# Patient Record
Sex: Male | Born: 1940 | Race: White | Marital: Married | Smoking: Former smoker
Health system: Southern US, Community
[De-identification: ages and names within clinical notes are randomized; demographics above are authoritative.]

## PROBLEM LIST (undated history)

## (undated) DIAGNOSIS — I10 Essential (primary) hypertension: Secondary | ICD-10-CM

## (undated) DIAGNOSIS — C9210 Chronic myeloid leukemia, BCR/ABL-positive, not having achieved remission: Secondary | ICD-10-CM

## (undated) DIAGNOSIS — E870 Hyperosmolality and hypernatremia: Secondary | ICD-10-CM

## (undated) DIAGNOSIS — E611 Iron deficiency: Secondary | ICD-10-CM

## (undated) DIAGNOSIS — G459 Transient cerebral ischemic attack, unspecified: Secondary | ICD-10-CM

## (undated) HISTORY — DX: Chronic myeloid leukemia, BCR/ABL-positive, not having achieved remission: C92.10

## (undated) HISTORY — DX: Essential (primary) hypertension: I10

## (undated) HISTORY — PX: TOOTH EXTRACTION: SUR596

---

## 2019-04-14 ENCOUNTER — Other Ambulatory Visit: Payer: Self-pay

## 2019-04-15 ENCOUNTER — Other Ambulatory Visit: Payer: Self-pay

## 2019-04-15 ENCOUNTER — Inpatient Hospital Stay: Payer: Medicare Other

## 2019-04-15 ENCOUNTER — Inpatient Hospital Stay: Payer: Medicare Other | Attending: Internal Medicine | Admitting: Internal Medicine

## 2019-04-15 ENCOUNTER — Encounter (INDEPENDENT_AMBULATORY_CARE_PROVIDER_SITE_OTHER): Payer: Self-pay

## 2019-04-15 DIAGNOSIS — D649 Anemia, unspecified: Secondary | ICD-10-CM | POA: Diagnosis not present

## 2019-04-15 DIAGNOSIS — C9210 Chronic myeloid leukemia, BCR/ABL-positive, not having achieved remission: Secondary | ICD-10-CM | POA: Diagnosis present

## 2019-04-15 DIAGNOSIS — F0390 Unspecified dementia without behavioral disturbance: Secondary | ICD-10-CM | POA: Diagnosis not present

## 2019-04-15 DIAGNOSIS — N189 Chronic kidney disease, unspecified: Secondary | ICD-10-CM | POA: Diagnosis not present

## 2019-04-15 LAB — CBC WITH DIFFERENTIAL/PLATELET
Abs Immature Granulocytes: 0.03 10*3/uL (ref 0.00–0.07)
Basophils Absolute: 0.1 10*3/uL (ref 0.0–0.1)
Basophils Relative: 1 %
Eosinophils Absolute: 0.2 10*3/uL (ref 0.0–0.5)
Eosinophils Relative: 2 %
HCT: 26.3 % — ABNORMAL LOW (ref 39.0–52.0)
Hemoglobin: 9.4 g/dL — ABNORMAL LOW (ref 13.0–17.0)
Immature Granulocytes: 0 %
Lymphocytes Relative: 11 %
Lymphs Abs: 0.8 10*3/uL (ref 0.7–4.0)
MCH: 34.3 pg — ABNORMAL HIGH (ref 26.0–34.0)
MCHC: 35.7 g/dL (ref 30.0–36.0)
MCV: 96 fL (ref 80.0–100.0)
Monocytes Absolute: 0.5 10*3/uL (ref 0.1–1.0)
Monocytes Relative: 7 %
Neutro Abs: 5.8 10*3/uL (ref 1.7–7.7)
Neutrophils Relative %: 79 %
Platelets: 217 10*3/uL (ref 150–400)
RBC: 2.74 MIL/uL — ABNORMAL LOW (ref 4.22–5.81)
RDW: 14.3 % (ref 11.5–15.5)
WBC: 7.4 10*3/uL (ref 4.0–10.5)
nRBC: 0 % (ref 0.0–0.2)

## 2019-04-15 LAB — COMPREHENSIVE METABOLIC PANEL
ALT: 25 U/L (ref 0–44)
AST: 28 U/L (ref 15–41)
Albumin: 4.3 g/dL (ref 3.5–5.0)
Alkaline Phosphatase: 59 U/L (ref 38–126)
Anion gap: 7 (ref 5–15)
BUN: 24 mg/dL — ABNORMAL HIGH (ref 8–23)
CO2: 25 mmol/L (ref 22–32)
Calcium: 9.2 mg/dL (ref 8.9–10.3)
Chloride: 102 mmol/L (ref 98–111)
Creatinine, Ser: 1.44 mg/dL — ABNORMAL HIGH (ref 0.61–1.24)
GFR calc Af Amer: 53 mL/min — ABNORMAL LOW (ref >60)
GFR calc non Af Amer: 46 mL/min — ABNORMAL LOW (ref >60)
Glucose, Bld: 96 mg/dL (ref 70–99)
Potassium: 3.9 mmol/L (ref 3.5–5.1)
Sodium: 134 mmol/L — ABNORMAL LOW (ref 135–145)
Total Bilirubin: 0.6 mg/dL (ref 0.3–1.2)
Total Protein: 6.7 g/dL (ref 6.5–8.1)

## 2019-04-15 LAB — PHOSPHORUS: Phosphorus: 2.9 mg/dL (ref 2.5–4.6)

## 2019-04-15 LAB — MAGNESIUM: Magnesium: 2 mg/dL (ref 1.7–2.4)

## 2019-04-15 LAB — LACTATE DEHYDROGENASE: LDH: 186 U/L (ref 98–192)

## 2019-04-15 NOTE — Progress Notes (Signed)
[name]  Patient Care Team: [place] Pink, MD as PCP - General (Family Medicine)  CHIEF COMPLAINTS/PURPOSE OF CONSULTATION: CML   #  Oncology History Overview Note  # CHRONIC MYELOID LEUKEMIA- [id number]; [place], [place] [2019-Dr.SunithaVemulapalli; [place]; [place]; [phone]; on Imatinib 300 mg/day [2014-poor tolerance to Sprycel]  # CKD [creat 1.4-1.6]; Mild anemia 9.4 - 10.   # Dementia  DIAGNOSIS: CML-Chronic  GOALS: control  CURRENT/MOST RECENT THERAPY: Imatinib.      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[place])  04/15/2019 Initial Diagnosis   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[place])      HISTORY OF PRESENTING ILLNESS: Patient a poor historian given dementia.   Alex Christensen 78 y.o.  male with mild to moderate dementia and history of CML on Gleevec has been referred to [place] for further evaluation recommendations.  Patient was diagnosed with chronic phase CML in 2001 in [place].  As per the wife patient was on a clinical trial-and was randomized to interferon arm for 6 months.  After approval of Gleevec in 2001, Gleevec/imatinib 300 mg since then.  At certain point of time, Sprycel was used however patient had side effects and then stop.  Patient has been on imatinib since then.  At some point of time as per the wife again-discontinuation of Gleevec was discussed; but never done.  Patient denies any weight loss but denies any nausea vomiting abdominal pain.  No unusual fatigue or chills.   Review of Systems  Constitutional: Negative for chills, diaphoresis, fever, malaise/fatigue and weight loss.  HENT: Negative for nosebleeds and sore throat.   Eyes: Negative for double vision.  Respiratory: Negative for cough, hemoptysis, sputum production, shortness of breath and wheezing.   Cardiovascular: Negative for chest pain, palpitations, orthopnea and leg swelling.  Gastrointestinal: Negative for abdominal pain, blood in stool, constipation, diarrhea, heartburn,  melena, nausea and vomiting.  Genitourinary: Negative for dysuria, frequency and urgency.  Musculoskeletal: Positive for back pain and joint pain.  Skin: Negative.  Negative for itching and rash.  Neurological: Negative for dizziness, tingling, focal weakness, weakness and headaches.  Endo/Heme/Allergies: Does not bruise/bleed easily.  Psychiatric/Behavioral: Positive for memory loss. Negative for depression. The patient is not nervous/anxious and does not have insomnia.      MEDICAL HISTORY:  Past Medical History:  Diagnosis Date  . High blood pressure   . Leukemia, chronic myeloid ([place])     SURGICAL HISTORY: Past Surgical History:  Procedure Laterality Date  . TOOTH EXTRACTION      SOCIAL HISTORY: Social History   Socioeconomic History  . Marital status: Unknown    Spouse name: Not on file  . Number of children: Not on file  . Years of education: Not on file  . Highest education level: Not on file  Occupational History  . Not on file  Social Needs  . Financial resource strain: Not on file  . Food insecurity    Worry: Not on file    Inability: Not on file  . Transportation needs    Medical: Not on file    Non-medical: Not on file  Tobacco Use  . Smoking status: Former Research scientist (life sciences)  . Smokeless tobacco: Never Used  Substance and Sexual Activity  . Alcohol use: Yes  . Drug use: Not on file  . Sexual activity: Not on file  Lifestyle  . Physical activity    Days per week: Not on file    Minutes per session: Not on file  . Stress: Not on file  Relationships  . Social Herbalist on phone: Not on file    Gets together: Not on file    Attends religious service: Not on file    Active member of club or organization: Not on file    Attends meetings of clubs or organizations: Not on file    Relationship status: Not on file  . Intimate partner violence    Fear of current or ex partner: Not on file    Emotionally abused: Not on file    Physically abused: Not on  file    Forced sexual activity: Not on file  Other Topics Concern  . Not on file  Social History Narrative   Patient lives at home with his wife.  No smoking.  No alcohol.  Used to live in [place]; moved to [place] a year ago.     FAMILY HISTORY: No family history on file.  ALLERGIES:  has no allergies on file.  MEDICATIONS:  Current Outpatient Medications  Medication Sig Dispense Refill  . aspirin EC 81 MG tablet Take by mouth.    Marland Kitchen atorvastatin (LIPITOR) 10 MG tablet Take by mouth.    . Folic Acid-Cholecalciferol 08-4998 MG-UNIT TABS Take by mouth.    . Ibuprofen 200 MG CAPS Take by mouth.    . imatinib (GLEEVEC) 100 MG tablet Take by mouth.    . Melatonin 10 MG TABS Take by mouth.    . meloxicam (MOBIC) 15 MG tablet Take by mouth.    . naproxen sodium (ALEVE) 220 MG tablet Take by mouth.    . traZODone (DESYREL) 50 MG tablet Take by mouth.     No current facility-administered medications for this visit.       Marland Kitchen  PHYSICAL EXAMINATION: ECOG PERFORMANCE STATUS: 0 - Asymptomatic  Vitals:   04/15/19 1516  BP: (!) 171/92  Pulse: 72  Resp: 16  Temp: 98.2 F (36.8 C)   Filed Weights   04/15/19 1516  Weight: 118 lb (53.5 kg)    Physical Exam  Constitutional: He is oriented to person, place, and time and well-developed, well-nourished, and in no distress.  He walks with a rolling walker.  HENT:  Head: Normocephalic and atraumatic.  Mouth/Throat: Oropharynx is clear and moist. No oropharyngeal exudate.  Eyes: Pupils are equal, round, and reactive to light.  Neck: Normal range of motion. Neck supple.  Cardiovascular: Normal rate and regular rhythm.  Pulmonary/Chest: No respiratory distress. He has no wheezes.  Abdominal: Soft. Bowel sounds are normal. He exhibits no distension and no mass. There is no abdominal tenderness. There is no rebound and no guarding.  Musculoskeletal: Normal range of motion.        General: No tenderness or edema.  Neurological: He is  alert and oriented to person, place, and time.  Skin: Skin is warm.  Psychiatric: Affect normal.    LABORATORY DATA:  I have reviewed the data as listed Lab Results  Component Value Date   WBC 7.4 04/15/2019   HGB 9.4 (L) 04/15/2019   HCT 26.3 (L) 04/15/2019   MCV 96.0 04/15/2019   PLT 217 04/15/2019   Recent Labs    04/15/19 1551  NA 134*  K 3.9  CL 102  CO2 25  GLUCOSE 96  BUN 24*  CREATININE 1.44*  CALCIUM 9.2  GFRNONAA 46*  GFRAA 53*  PROT 6.7  ALBUMIN 4.3  AST 28  ALT 25  ALKPHOS 59  BILITOT 0.6    RADIOGRAPHIC STUDIES: I  have personally reviewed the radiological images as listed and agreed with the findings in the report. No results found.  ASSESSMENT & PLAN:   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[place]) # Chronic myeloid leukemia-chronic phase-Gleevec 300 mg once a day; since 2001.  As per family patient had no significant issues with tolerance.  No records from prior hematologist office; we will try to obtain the records.   #For now would recommend continued Gleevec 300 milligrams a day.  Check CBC CMP LDH; BCR ABL RT-PCR.   #Dementia-mild to [facility] as per family.  #I spoke to patient's wife [name] at length regarding above plan.  She will call the [facility] pharmacy-to further direct the refills for Gleevec from [place].   Thank you Dr.Hedrick for allowing me to participate in the care of your pleasant patient. Please do not hesitate to contact me with questions or concerns in the interim. Spoke to patient's wife, [name] at length and answered all questions.   # DISPOSITION:  # labs today # follow up 4 months-MD/[ same labs as today]-Dr.B   Addendum: PCP office labs reviewed show hemoglobin 10/normal white count normal platelets; creatinine 1.6.   Repeat labs in office show hemoglobin 9.4/creatinine 1.44.  I suspect patient has chronic kidney disease/anemia.  Recommend p.o. iron.  Will check iron studies at next visit.  Discussed the patient's wife.  # 60  minutes face-to-face with the patient/wife discussing the above plan of care; more than 50% of time spent on prognosis/ natural history; counseling and coordination.    All questions were answered. The patient knows to call the clinic with any problems, questions or concerns.    Cammie Sickle, MD 04/17/2019 4:38 PM

## 2019-04-15 NOTE — Assessment & Plan Note (Addendum)
#   Chronic myeloid leukemia-chronic phase-Gleevec 300 mg once a day; since 2001.  As per family patient had no significant issues with tolerance.  No records from prior hematologist office; we will try to obtain the records.   #For now would recommend continued Gleevec 300 milligrams a day.  Check CBC CMP LDH; BCR ABL RT-PCR.   #Dementia-mild to [facility] as per family.  #I spoke to patient's wife [name] at length regarding above plan.  She will call the [facility] pharmacy-to further direct the refills for Gleevec from [place].   Thank you Dr.Hedrick for allowing me to participate in the care of your pleasant patient. Please do not hesitate to contact me with questions or concerns in the interim. Spoke to patient's wife, [name] at length and answered all questions.   # DISPOSITION:  # labs today # follow up 4 months-MD/[ same labs as today]-Dr.B   Addendum: PCP office labs reviewed show hemoglobin 10/normal white count normal platelets; creatinine 1.6.   Repeat labs in office show hemoglobin 9.4/creatinine 1.44.  I suspect patient has chronic kidney disease/anemia.  Recommend p.o. iron.  Will check iron studies at next visit.  Discussed the patient's wife.  # 60 minutes face-to-face with the patient/wife discussing the above plan of care; more than 50% of time spent on prognosis/ natural history; counseling and coordination.

## 2019-04-16 ENCOUNTER — Telehealth: Payer: Self-pay

## 2019-04-16 ENCOUNTER — Encounter: Payer: Self-pay | Admitting: Internal Medicine

## 2019-04-16 NOTE — Telephone Encounter (Signed)
I contacted patient's wife and asked if she could come in to sign a release form for patient to obtain records from patient's doctor when he lived in [place].  Patient's wife stated that she is very busy this week and that she would be unable to come in, but she will come here next Tuesday to sign a release form.

## 2019-04-17 ENCOUNTER — Encounter: Payer: Self-pay | Admitting: *Deleted

## 2019-04-21 ENCOUNTER — Telehealth: Payer: Self-pay | Admitting: Internal Medicine

## 2019-04-21 NOTE — Telephone Encounter (Signed)
Spoke to patient's [name] regarding patient's elevated creatinine [likely CKD]; also mild anemia/likely from CKD iron deficiency.  Recommend p.o. iron intake.  Await records from previous oncologist office in [place].  Continue current imatinib.  GB

## 2019-04-23 LAB — BCR-ABL1, CML/ALL, PCR, QUANT: b2a2 transcript: 17.1705 %

## 2019-04-27 ENCOUNTER — Other Ambulatory Visit: Payer: Self-pay | Admitting: Neurology

## 2019-04-27 DIAGNOSIS — G459 Transient cerebral ischemic attack, unspecified: Secondary | ICD-10-CM

## 2019-04-28 ENCOUNTER — Telehealth: Payer: Self-pay | Admitting: Internal Medicine

## 2019-04-28 NOTE — Telephone Encounter (Signed)
I left a message for patient's wife Gloria-to call us back to discuss patient's positive BCR ABL.   Heather/Brooke- please check on the status of records but we are waiting from [place].patient's wife was supposed to come last week to sign release.

## 2019-04-29 ENCOUNTER — Telehealth: Payer: Self-pay | Admitting: Internal Medicine

## 2019-04-29 NOTE — Telephone Encounter (Signed)
Call attempt made to patient's wife. No answer.

## 2019-04-29 NOTE — Telephone Encounter (Signed)
Attempted to call pts wife to see if she had come in to sign a ROI. There was not a machine so couldn't leave VM continuous rigning

## 2019-04-29 NOTE — Telephone Encounter (Signed)
Colette, could you reach out to the wife to see if wife ever came in to complete a consent for release of records

## 2019-04-30 NOTE — Telephone Encounter (Signed)
Spoke to patient's wife regarding BCR ABL transcripts being positive-patient is not in molecular remission.  Stressed the importance of loss of remission.  Recommend signing release to get the medical records from previous practice.  She states that she would come either on 17th or the 18th.

## 2019-05-02 ENCOUNTER — Ambulatory Visit
Admission: RE | Admit: 2019-05-02 | Discharge: 2019-05-02 | Disposition: A | Discharge status: Home / Self Care | Payer: Medicare Other | Source: Ambulatory Visit | Attending: Neurology | Admitting: Neurology

## 2019-05-02 ENCOUNTER — Other Ambulatory Visit: Payer: Self-pay

## 2019-05-02 DIAGNOSIS — G459 Transient cerebral ischemic attack, unspecified: Secondary | ICD-10-CM

## 2019-05-20 ENCOUNTER — Other Ambulatory Visit: Payer: Self-pay | Admitting: Neurology

## 2019-05-20 DIAGNOSIS — D333 Benign neoplasm of cranial nerves: Secondary | ICD-10-CM

## 2019-05-28 ENCOUNTER — Encounter: Payer: Medicare Other | Admitting: Speech Pathology

## 2019-06-01 ENCOUNTER — Ambulatory Visit: Payer: Medicare Other

## 2019-06-11 ENCOUNTER — Ambulatory Visit: Payer: Medicare Other | Admitting: Speech Pathology

## 2019-06-12 ENCOUNTER — Ambulatory Visit
Admission: RE | Admit: 2019-06-12 | Discharge: 2019-06-12 | Disposition: A | Discharge status: Home / Self Care | Payer: Medicare Other | Source: Ambulatory Visit | Attending: Neurology | Admitting: Neurology

## 2019-06-12 ENCOUNTER — Other Ambulatory Visit: Payer: Self-pay

## 2019-06-12 DIAGNOSIS — D333 Benign neoplasm of cranial nerves: Secondary | ICD-10-CM | POA: Insufficient documentation

## 2019-06-12 LAB — POCT I-STAT CREATININE: Creatinine, Ser: 1.7 mg/dL — ABNORMAL HIGH (ref 0.61–1.24)

## 2019-06-15 ENCOUNTER — Ambulatory Visit: Payer: Medicare Other | Attending: Neurology | Admitting: Speech Pathology

## 2019-06-15 ENCOUNTER — Other Ambulatory Visit: Payer: Self-pay

## 2019-06-15 DIAGNOSIS — R41841 Cognitive communication deficit: Secondary | ICD-10-CM

## 2019-06-16 ENCOUNTER — Other Ambulatory Visit: Payer: Self-pay

## 2019-06-16 ENCOUNTER — Encounter: Payer: Self-pay | Admitting: Speech Pathology

## 2019-06-16 NOTE — Therapy (Signed)
[place] [facility]IN [facility] SERVICES [address] [place], [place], [address] Phone: [phone]   Fax:  [phone]  Speech Language Pathology Evaluation  Patient Details  Name: Alex Christensen MRN: [id number] Date of Birth: 1941/04/01 Referring Provider (SLP): Dr. Manuella Ghazi   Encounter Date: 06/15/2019  End of Session - 06/16/19 1332    Visit Number  1    Number of Visits  9    Date for SLP Re-Evaluation  07/17/19    Authorization Type  Medicare    Authorization Time Period  Start 06/15/2019    Authorization - Visit Number  1    Authorization - Number of Visits  10    SLP Start Time  1600    SLP Stop Time   1700    SLP Time Calculation (min)  60 min    Activity Tolerance  Patient tolerated treatment well       Past Medical History:  Diagnosis Date  . High blood pressure   . Leukemia, chronic myeloid ([place])     Past Surgical History:  Procedure Laterality Date  . TOOTH EXTRACTION      There were no vitals filed for this visit.      SLP Evaluation [facility] - 06/16/19 0001      SLP Visit Information   SLP Received On  06/15/19    Referring Provider (SLP)  Dr. Manuella Ghazi    Onset Date  05/06/2019    Medical Diagnosis  TIA      Subjective   Subjective  The patient was alert, pleasant, and cooperative throughout the speech therapy evaluation, despite initially stating that he felt "confused and annoyed" about being referred for a cognitive-communication assessment.    Patient/Family Stated Goal  Patient stated at the beginning of the evaluation session that he would like to be discharged as quickly as possible; however, at the conclusion of the session, he agreed that he would benefit from training in compensatory strategy use and increased engagement in his daily living. Patient's wife hopes skilled speech therapy treatment will increase patient's awareness of his cognitive-communication deficits and "decrease his confusion".      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 Patient had recurrent episodes of slurred speech and right sided facial droop in 02/2019 with no known prior history of stroke or TIA. Patient's wife reports noticing a decline in patient's orientation and memory abilities since 01/2019. Patient requires assistance with medication and financial management. 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 Patient has not received prior SLP treatment.      Prior Functional Status   Cognitive/Linguistic Baseline  Within functional limits      Cognition   Overall Cognitive Status  Impaired/Different from baseline      Auditory Comprehension   Overall Auditory Comprehension  Impaired    Overall Auditory Comprehension Comments  Impaired for lengthy/abstract verbal information      Reading Comprehension   Reading Status  Impaired      Expression   Primary Mode of Expression  Verbal      Verbal Expression   Overall Verbal Expression  Impaired      Written Expression   Written Expression  Exceptions to [facility]      Oral Motor/Sensory Function  Overall Oral Motor/Sensory Function  Appears within functional limits for tasks assessed      Motor Speech   Overall Motor Speech  Appears within functional limits for tasks assessed         Medical SLPs: Cognitive Communication Assessment Auditory Comprehension      Pointing to Single Items  10/10       Yes/No Questions   10/10   1-Step Commands (Oral Mech)  8/8       2-Step Commands   2/3   Multi-Step Commands   1/3       Understanding Conversation  2/4     Verbal Expression    Repetition     3/3        Confrontational Naming  12/12       Divergent Naming   1/4    Responsive Naming    4/4       Convergent Naming   3/5       Object Description   5/5       Sentence Formulation  stimulable for expansion/specification of information content and correction of semantic paraphasias, given prompting  3-Word Recall    1/3 (with cues)     Written Expression        Confrontational Naming  6/6      Personal Information   2/3    Functional Messages    0/2        Clock Drawing    2/3      Reading Comprehension    Reading Ability/Recognition  4/4        Words-Phrases Matching   4/4        Paragraph Comprehension  4/4        Functional Reading - Menu  2/4     Cognition         Recent Memory   1/2       Making Correct Change  3/3    Calculating Coins    2/3        Organization    4/5 (with cues)        Observations: Patient exhibits reduced awareness of his functional deficits, as evidenced by reluctance to admit that he requires his wife's assistance with medication and financial management and stating that his decline in memory function has "not really" resulted in any changes in his independence. Slow information processing, word retrieval difficulties, and delayed response times noted throughout the evaluation.   SLP Education - 06/16/19 1331    Education Details  Evaluation results and recommendations were shared with the patient and his wife at the conclusion of the session. The patient verbalized understanding and agreement to participate in speech therapy treatment to address cognitive-communication deficits.    Person(s) Educated  Patient;Spouse    Methods  Explanation    Comprehension  Verbalized understanding         SLP Long Term Goals - 06/16/19 1335      SLP LONG TERM GOAL #1   Title  Patient will demonstrate functional cognitive-communication skills for independent completion of personal responsibilities and leisure activities.    Time  4    Period  Weeks    Status  New    Target Date  07/17/19      SLP LONG TERM GOAL #2   Title  Patient will complete  complex visual-spatial activities with 80% accuracy.    Time  4    Period  Weeks    Status  New  Target Date  07/17/19      SLP LONG TERM GOAL #3   Title  Patient will complete high level word finding activities with 80% accuracy.    Time  4    Period  Weeks    Status  New    Target Date  07/17/19      SLP LONG TERM GOAL #4   Title  Patient will identify cognitive-communication barriers and participate in developing functional compensatory strategies.    Time  4    Period  Weeks    Status  New    Target Date  07/17/19       Plan - 06/16/19 1334    Clinical Impression Statement  The patient presents with moderate cognitive-communication impairment characterized by word finding deficits at the conversational level, impaired comprehension of lengthy/complex language presented verbally, reduced short-term memory, slow information processing, reduced attention, delayed response times, reduced awareness of deficits, and impaired functional written expression abilities. Patient would benefit from skilled SLP intervention for training in use of external memory aids and compensatory strategies for maximizing functional cognitive-linguistic skills.    Speech Therapy Frequency  2x / week    Duration  4 weeks    Treatment/Interventions  Language facilitation;Cognitive reorganization;Patient/family education;Compensatory strategies    Potential to Achieve Goals  Good    Potential Considerations  Ability to learn/carryover information;Previous level of function;Co-morbidities;Severity of impairments;Cooperation/participation level;Medical prognosis;Family/community support    SLP Home Exercise Plan  TBD    Consulted and Agree with Plan of Care  Patient;Family member/caregiver    Family Member Consulted  Spouse       Patient will benefit from skilled therapeutic intervention in order to improve the following deficits and impairments:   Cognitive communication deficit - Plan: SLP plan of care  cert/re-cert    Problem List Patient Active Problem List   Diagnosis Date Noted  . 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[place]) 04/15/2019   Leroy Sea, MS/CCC- SLP  Lou Miner 06/16/2019, 1:44 PM  [place] MAIN [facility] SERVICES [address] [place], [place], [address] Phone: [phone]   Fax:  [phone]  Name: Alex Christensen MRN: [id number] Date of Birth: 07-19-1941

## 2019-06-18 ENCOUNTER — Encounter: Payer: Medicare Other | Admitting: Speech Pathology

## 2019-06-22 ENCOUNTER — Other Ambulatory Visit: Payer: Self-pay

## 2019-06-22 ENCOUNTER — Ambulatory Visit: Payer: Medicare Other | Admitting: Speech Pathology

## 2019-06-22 DIAGNOSIS — R41841 Cognitive communication deficit: Secondary | ICD-10-CM

## 2019-06-23 ENCOUNTER — Encounter: Payer: Self-pay | Admitting: Speech Pathology

## 2019-06-23 NOTE — Therapy (Signed)
[place] MAIN [facility] ([facility]) SERVICES [address] [place], [place], [address] Phone: [phone]   Fax:  [phone]  Speech Language Pathology Treatment  Patient Details  Name: Alex Christensen MRN: [id number] Date of Birth: 1941/06/19 Referring Provider (SLP): Dr. Manuella Ghazi   Encounter Date: 06/22/2019  End of Session - 06/23/19 0851    Visit Number  2    Number of Visits  9    Date for SLP Re-Evaluation  07/17/19    Authorization Type  Medicare    Authorization Time Period  Start 06/15/2019    Authorization - Visit Number  2    Authorization - Number of Visits  10    SLP Start Time  1500    SLP Stop Time   1550    SLP Time Calculation (min)  50 min    Activity Tolerance  Patient tolerated treatment well       Past Medical History:  Diagnosis Date  . High blood pressure   . 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 TOOTH EXTRACTION      There were no vitals filed for this visit.  Subjective Assessment - 06/23/19 0850    Subjective  The patient was alert, cooperative, and pleasant throughout the therapy session. He was accompanied by his wife, [name].    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 Patient had recurrent episodes of slurred speech and right sided facial droop in 02/2019 with no known prior history of stroke or TIA. Patient's wife reports noticing a decline in patient's orientation and memory abilities since 01/2019. Patient requires assistance with medication and financial management. 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 Patient has not received prior SLP treatment.       Treatment Provided   Treatment provided  Cognitive-Linquistic      Pain Assessment   Pain Assessment  No/denies pain      Cognitive-Linquistic Treatment   Treatment focused on  Cognition    Skilled Treatment  Given moderate prompting to identify cognitive-communication barriers, patient stated that he often feels "bored" and identified personal responsibilities and leisure activities that he has given up during recent months due to his cognitive-communication challenges, including: management of personal finances and medications, correspondence with former colleagues, and meal preparation. Patient and caregiver education was provided verbally regarding visual memory aids. Patient and his wife both verbalized understanding. Given moderate verbal cueing, patient and his wife both participated in developing functional compensatory strategies for facilitating patient's engagement with the identified personal responsibilities and leisure activities in a safe manner. Patient completed a complex visual-spatial task with 75% accuracy independently. Given minimal cueing, accuracy increased to 96%. Patient completed a high level word finding activity with 91% accuracy with delayed response times.      Assessment / Recommendations / Plan   Plan  Continue with current plan of care      Progression Toward Goals   Progression toward goals  Progressing toward goals       SLP  Education - 06/23/19 0851    Education Details  Identification of cognitive-communication barriers and development of functional compensatory strategies.    Person(s) Educated  Patient;Spouse    Methods  Explanation    Comprehension  Verbalized understanding         SLP Long Term Goals - 06/16/19 1335      SLP LONG TERM GOAL #1   Title  Patient will demonstrate functional cognitive-communication skills for  independent completion of personal responsibilities and leisure activities.    Time  4    Period  Weeks    Status  New    Target Date  07/17/19      SLP LONG TERM GOAL #2   Title  Patient will complete complex visual-spatial activities with 80% accuracy.    Time  4    Period  Weeks    Status  New    Target Date  07/17/19      SLP LONG TERM GOAL #3   Title  Patient will complete high level word finding activities with 80% accuracy.    Time  4    Period  Weeks    Status  New    Target Date  07/17/19      SLP LONG TERM GOAL #4   Title  Patient will identify cognitive-communication barriers and participate in developing functional compensatory strategies.    Time  4    Period  Weeks    Status  New    Target Date  07/17/19       Plan - 06/23/19 [id number]    Clinical Impression Statement  The patient presents with moderate cognitive-communication impairment characterized by word finding deficits at the conversational level, impaired comprehension of lengthy/complex language presented verbally, reduced short-term memory, slow information processing, reduced attention, delayed response times, reduced awareness of deficits, and impaired functional written expression abilities. He is reluctant to accept that his cognitive-communication abilities have declined during recent months, reducing his overall functional independence and increasing caregiver burden. Patient is stimulable for word finding given semantic cueing. Patient and his wife agree to attempt use of visual aids for facilitating orientation and short-term memory, as well as functional compensatory strategies for safely increasing patient's engagement with personal responsibilities and leisure activities. Will continue with skilled SLP intervention for ongoing training in use of external memory aids and compensatory strategies for maximizing functional cognitive-linguistic skills.    Speech Therapy Frequency  2x / week    Duration  4  weeks    Treatment/Interventions  Language facilitation;Cognitive reorganization;Patient/family education;Compensatory strategies;Internal/external aids;Functional tasks    Potential to Achieve Goals  Good    Potential Considerations  Ability to learn/carryover information;Previous level of function;Co-morbidities;Severity of impairments;Cooperation/participation level;Medical prognosis;Family/community support    SLP Home Exercise Plan  Provided    Consulted and Agree with Plan of Care  Patient;Family member/caregiver    Family Member Consulted  Spouse       Patient will benefit from skilled therapeutic intervention in order to improve the following deficits and impairments:   Cognitive communication deficit    Problem List Patient Active Problem List   Diagnosis Date Noted  . 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[place]) 04/15/2019   Hamdi Vari A. Francis Dowse., Graduate Clinician Vella Kohler 06/23/2019, 8:53 AM  [place] MAIN [facility] SERVICES [address] [place], [place], [address] Phone: [phone]   Fax:  [phone]   Name: Alex Christensen MRN: [id number] Date of Birth: July 29, 1941

## 2019-06-25 ENCOUNTER — Ambulatory Visit: Payer: Medicare Other | Admitting: Speech Pathology

## 2019-06-29 ENCOUNTER — Ambulatory Visit: Payer: Medicare Other | Admitting: Speech Pathology

## 2019-07-02 ENCOUNTER — Encounter: Payer: Medicare Other | Admitting: Speech Pathology

## 2019-07-06 ENCOUNTER — Encounter: Payer: Medicare Other | Admitting: Speech Pathology

## 2019-07-13 ENCOUNTER — Encounter: Payer: Medicare Other | Admitting: Speech Pathology

## 2019-07-15 ENCOUNTER — Encounter: Payer: Medicare Other | Admitting: Speech Pathology

## 2019-07-24 ENCOUNTER — Encounter: Payer: Medicare Other | Admitting: Speech Pathology

## 2019-07-29 ENCOUNTER — Encounter: Payer: Medicare Other | Admitting: Speech Pathology

## 2019-07-31 ENCOUNTER — Encounter: Payer: Medicare Other | Admitting: Speech Pathology

## 2019-08-19 ENCOUNTER — Inpatient Hospital Stay: Payer: Medicare Other | Attending: Internal Medicine | Admitting: *Deleted

## 2019-08-19 ENCOUNTER — Other Ambulatory Visit: Payer: Self-pay

## 2019-08-19 ENCOUNTER — Encounter: Payer: Self-pay | Admitting: Internal Medicine

## 2019-08-19 ENCOUNTER — Inpatient Hospital Stay (HOSPITAL_BASED_OUTPATIENT_CLINIC_OR_DEPARTMENT_OTHER): Payer: Medicare Other | Admitting: Internal Medicine

## 2019-08-19 DIAGNOSIS — N183 Chronic kidney disease, stage 3 unspecified: Secondary | ICD-10-CM | POA: Insufficient documentation

## 2019-08-19 DIAGNOSIS — F0390 Unspecified dementia without behavioral disturbance: Secondary | ICD-10-CM | POA: Diagnosis not present

## 2019-08-19 DIAGNOSIS — C9210 Chronic myeloid leukemia, BCR/ABL-positive, not having achieved remission: Secondary | ICD-10-CM

## 2019-08-19 DIAGNOSIS — D649 Anemia, unspecified: Secondary | ICD-10-CM | POA: Insufficient documentation

## 2019-08-19 LAB — COMPREHENSIVE METABOLIC PANEL
ALT: 18 U/L (ref 0–44)
AST: 27 U/L (ref 15–41)
Albumin: 4 g/dL (ref 3.5–5.0)
Alkaline Phosphatase: 63 U/L (ref 38–126)
Anion gap: 9 (ref 5–15)
BUN: 22 mg/dL (ref 8–23)
CO2: 25 mmol/L (ref 22–32)
Calcium: 9.1 mg/dL (ref 8.9–10.3)
Chloride: 99 mmol/L (ref 98–111)
Creatinine, Ser: 1.45 mg/dL — ABNORMAL HIGH (ref 0.61–1.24)
GFR calc Af Amer: 53 mL/min — ABNORMAL LOW (ref >60)
GFR calc non Af Amer: 45 mL/min — ABNORMAL LOW (ref >60)
Glucose, Bld: 110 mg/dL — ABNORMAL HIGH (ref 70–99)
Potassium: 3.7 mmol/L (ref 3.5–5.1)
Sodium: 133 mmol/L — ABNORMAL LOW (ref 135–145)
Total Bilirubin: 0.6 mg/dL (ref 0.3–1.2)
Total Protein: 6.3 g/dL — ABNORMAL LOW (ref 6.5–8.1)

## 2019-08-19 LAB — CBC WITH DIFFERENTIAL/PLATELET
Abs Immature Granulocytes: 0.03 10*3/uL (ref 0.00–0.07)
Basophils Absolute: 0 10*3/uL (ref 0.0–0.1)
Basophils Relative: 0 %
Eosinophils Absolute: 0.1 10*3/uL (ref 0.0–0.5)
Eosinophils Relative: 2 %
HCT: 28.5 % — ABNORMAL LOW (ref 39.0–52.0)
Hemoglobin: 9.7 g/dL — ABNORMAL LOW (ref 13.0–17.0)
Immature Granulocytes: 0 %
Lymphocytes Relative: 11 %
Lymphs Abs: 0.9 10*3/uL (ref 0.7–4.0)
MCH: 33.8 pg (ref 26.0–34.0)
MCHC: 34 g/dL (ref 30.0–36.0)
MCV: 99.3 fL (ref 80.0–100.0)
Monocytes Absolute: 0.6 10*3/uL (ref 0.1–1.0)
Monocytes Relative: 8 %
Neutro Abs: 6.4 10*3/uL (ref 1.7–7.7)
Neutrophils Relative %: 79 %
Platelets: 243 10*3/uL (ref 150–400)
RBC: 2.87 MIL/uL — ABNORMAL LOW (ref 4.22–5.81)
RDW: 14 % (ref 11.5–15.5)
WBC: 8.1 10*3/uL (ref 4.0–10.5)
nRBC: 0 % (ref 0.0–0.2)

## 2019-08-19 LAB — MAGNESIUM: Magnesium: 2.2 mg/dL (ref 1.7–2.4)

## 2019-08-19 LAB — PHOSPHORUS: Phosphorus: 2.7 mg/dL (ref 2.5–4.6)

## 2019-08-19 LAB — LACTATE DEHYDROGENASE: LDH: 181 U/L (ref 98–192)

## 2019-08-19 NOTE — Assessment & Plan Note (Addendum)
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  BCR ABL from today pending; although patient seems to be in hematologic response [normal white count/platelets-mild anemia; see below]   Still no records from prior hematologist office; we will try to obtain the records.  Wife signed release today.  #Discussed that if patient has continues loss of molecular response-further steps would include FISH testing/blood; repeat bone marrow biopsy; mutation analysis etc.  Discussed alternate treatment options include-Sprycel/Tasigna [previous intolerance to therapy as per patient's wife]; ponatinib.  Again reviewed the potential risk of transformation to acute leukemia/blast crisis-although clinically unlikely at this time.  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  On p.o. iron.  Will monitor for now.   # CKD- stage III- STABLE.  Recommend no nephrotoxic agents.   # Dementia/subacute stroke-MRI 2020 [Dr.Shah]-mild to moderate-stable.  # I discussed regarding Covid precautions/and also discussed proceeding with Covid vaccination when available.  Discussed that unfortunately the data safety and efficacy of vaccination is unclear especially in patients with immunocompromised state.  However, I think the benefits of the vaccination outweigh the potential risks. Information re: Covid vaccination given to pt/ wife.   I spoke at length with the patient's wife, [name]- regarding the patient's clinical status/plan of care.  Family agreement.    # DISPOSITION: office records # Follow up to be decided-Dr.B

## 2019-08-19 NOTE — Progress Notes (Signed)
[place] NOTE  Patient Care Team: [place] Pink, MD as PCP - General (Family Medicine)  CHIEF COMPLAINTS/PURPOSE OF CONSULTATION: CML   #  Oncology History Overview Note  # CHRONIC MYELOID LEUKEMIA- [7858]; [place], [place] [2019-Dr.SunithaVemulapalli; [place]; [place]; [phone]; on Imatinib 300 mg/day [2014-poor tolerance to Sprycel- GI issues; Tasigna-again poor tolerance]  # CKD [creat 1.4-1.6]; Mild anemia 9.4 - 10.   # Dementia  DIAGNOSIS: CML-Chronic  GOALS: control  CURRENT/MOST RECENT THERAPY: Imatinib.      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[place])  04/15/2019 Initial Diagnosis   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[place])      HISTORY OF PRESENTING ILLNESS: Patient a poor historian given dementia.   Alex Christensen 79 y.o.  male with mild to moderate dementia and history of CML on Gleevec is here for follow-up.  In the interim patient has been followed by neurology for his dementia; MRI 2020 showed subacute stroke.  Otherwise is currently stable.  No swelling in the legs.  No nausea no vomiting preoperative fair.  Continues to be intubated without any major problems.  Review of Systems  Constitutional: Negative for chills, diaphoresis, fever, malaise/fatigue and weight loss.  HENT: Negative for nosebleeds and sore throat.   Eyes: Negative for double vision.  Respiratory: Negative for cough, hemoptysis, sputum production, shortness of breath and wheezing.   Cardiovascular: Negative for chest pain, palpitations, orthopnea and leg swelling.  Gastrointestinal: Negative for abdominal pain, blood in stool, constipation, diarrhea, heartburn, melena, nausea and vomiting.  Genitourinary: Negative for dysuria, frequency and urgency.  Musculoskeletal: Positive for back pain and joint pain.  Skin: Negative.  Negative for itching and rash.  Neurological: Negative for dizziness, tingling, focal weakness, weakness and headaches.  Endo/Heme/Allergies: Does not  bruise/bleed easily.  Psychiatric/Behavioral: Positive for memory loss. Negative for depression. The patient is not nervous/anxious and does not have insomnia.      MEDICAL HISTORY:  Past Medical History:  Diagnosis Date  . High blood pressure   . 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 TOOTH EXTRACTION      SOCIAL HISTORY: Social History   Socioeconomic History  . Marital status: Married    Spouse name: Not on file  . Number of children: Not on file  . Years of education: Not on file  . Highest education level: Not on file  Occupational History  . Not on file  Tobacco Use  . Smoking status: Former Research scientist (life sciences)  . Smokeless tobacco: Never Used  Substance and Sexual Activity  . Alcohol use: Yes  . Drug use: Not on file  . Sexual activity: Not on file  Other Topics Concern  . Not on file  Social History Narrative   Patient lives at home with his wife.  No smoking.  No alcohol.  Used to live in [place]; moved to [place] a year ago.    Social Determinants of Health   Financial Resource Strain:   . Difficulty of Paying Living Expenses: Not on file  Food Insecurity:   . Worried About Charity fundraiser in the Last Year: Not on file  . Ran Out of Food in the Last Year: Not on file  Transportation Needs:   . Lack of Transportation (Medical): Not on file  . Lack of Transportation (Non-Medical): Not on file  Physical Activity:   . Days of Exercise per Week: Not on file  . Minutes of Exercise per Session: Not  on file  Stress:   . Feeling of Stress : Not on file  Social Connections:   . Frequency of Communication with Friends and Family: Not on file  . Frequency of Social Gatherings with Friends and Family: Not on file  . Attends Religious Services: Not on file  . Active Member of Clubs or Organizations: Not on file  . Attends Archivist Meetings: Not on file  . Marital Status: Not on file   Intimate Partner Violence:   . Fear of Current or Ex-Partner: Not on file  . Emotionally Abused: Not on file  . Physically Abused: Not on file  . Sexually Abused: Not on file    FAMILY HISTORY: No family history on file.  ALLERGIES:  has no allergies on file.  MEDICATIONS:  Current Outpatient Medications  Medication Sig Dispense Refill  . aspirin 325 MG tablet Take 325 mg by mouth daily.    Marland Kitchen atorvastatin (LIPITOR) 10 MG tablet Take by mouth.    . Folic Acid-Cholecalciferol 08-4998 MG-UNIT TABS Take by mouth.    . imatinib (GLEEVEC) 100 MG tablet Take by mouth.    . traZODone (DESYREL) 50 MG tablet Take by mouth.    Marland Kitchen aspirin EC 81 MG tablet Take by mouth.    . Ibuprofen 200 MG CAPS Take by mouth.    . Melatonin 10 MG TABS Take by mouth.    . meloxicam (MOBIC) 15 MG tablet Take by mouth.    . naproxen sodium (ALEVE) 220 MG tablet Take by mouth.     No current facility-administered medications for this visit.      Marland Kitchen  PHYSICAL EXAMINATION: ECOG PERFORMANCE STATUS: 0 - Asymptomatic  Vitals:   08/19/19 1453  BP: (!) 158/88  Pulse: 80  Temp: (!) 96.9 F (36.1 C)   Filed Weights   08/19/19 1453  Weight: 113 lb (51.3 kg)    Physical Exam  Constitutional: He is oriented to person, place, and time and well-developed, well-nourished, and in no distress.  He walks with a rolling walker.  Accompanied by his wife.  HENT:  Head: Normocephalic and atraumatic.  Mouth/Throat: Oropharynx is clear and moist. No oropharyngeal exudate.  Eyes: Pupils are equal, round, and reactive to light.  Cardiovascular: Normal rate and regular rhythm.  Pulmonary/Chest: No respiratory distress. He has no wheezes.  Abdominal: Soft. Bowel sounds are normal. He exhibits no distension and no mass. There is no abdominal tenderness. There is no rebound and no guarding.  Musculoskeletal:        General: No tenderness or edema. Normal range of motion.     Cervical back: Normal range of motion and  neck supple.  Neurological: He is alert and oriented to person, place, and time.  Skin: Skin is warm.  Psychiatric: Affect normal.    LABORATORY DATA:  I have reviewed the data as listed Lab Results  Component Value Date   WBC 8.1 08/19/2019   HGB 9.7 (L) 08/19/2019   HCT 28.5 (L) 08/19/2019   MCV 99.3 08/19/2019   PLT 243 08/19/2019   Recent Labs    04/15/19 1551 06/12/19 1455 08/19/19 1412  NA 134*  --  133*  K 3.9  --  3.7  CL 102  --  99  CO2 25  --  25  GLUCOSE 96  --  110*  BUN 24*  --  22  CREATININE 1.44* 1.70* 1.45*  CALCIUM 9.2  --  9.1  GFRNONAA 46*  --  45*  GFRAA 53*  --  53*  PROT 6.7  --  6.3*  ALBUMIN 4.3  --  4.0  AST 28  --  27  ALT 25  --  18  ALKPHOS 59  --  63  BILITOT 0.6  --  0.6    RADIOGRAPHIC STUDIES: I have personally reviewed the radiological images as listed and agreed with the findings in the report. No results found.  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  BCR ABL from today pending; although patient seems to be in hematologic response [normal white count/platelets-mild anemia; see below]   Still no records from prior hematologist office; we will try to obtain the records.  Wife signed release today.  #Discussed that if patient has continues loss of molecular response-further steps would include FISH testing/blood; repeat bone marrow biopsy; mutation analysis etc.  Discussed alternate treatment options include-Sprycel/Tasigna [previous intolerance to therapy as per patient's wife]; ponatinib.  Again reviewed the potential risk of transformation to acute leukemia/blast crisis-although clinically unlikely at this time.  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  On p.o. iron.  Will monitor for now.   # CKD- stage III- STABLE.  Recommend no nephrotoxic agents.   #  Dementia/subacute stroke-MRI 2020 [Dr.Shah]-mild to moderate-stable.  # I discussed regarding Covid precautions/and also discussed proceeding with Covid vaccination when available.  Discussed that unfortunately the data safety and efficacy of vaccination is unclear especially in patients with immunocompromised state.  However, I think the benefits of the vaccination outweigh the potential risks. Information re: Covid vaccination given to pt/ wife.   I spoke at length with the patient's wife, [name]- regarding the patient's clinical status/plan of care.  Family agreement.    # DISPOSITION: office records # Follow up to be decided-Dr.B  All questions were answered. The patient knows to call the clinic with any problems, questions or concerns.    Cammie Sickle, MD 08/20/2019 8:24 AM

## 2019-08-26 LAB — BCR-ABL1, CML/ALL, PCR, QUANT: b2a2 transcript: 3.1369 %

## 2019-08-27 ENCOUNTER — Telehealth: Payer: Self-pay | Admitting: Internal Medicine

## 2019-08-27 NOTE — Telephone Encounter (Signed)
Spoke to patient's wife regarding results of the BCR ABL-improved from 17.1 to 3.1; however still not molecular remission.  Given the reported history of intolerance to previous TKIs-I think is reasonable to continue imatinib at current dose of 300 mg a day.

## 2019-08-28 ENCOUNTER — Other Ambulatory Visit: Payer: Self-pay | Admitting: Neurology

## 2019-08-28 DIAGNOSIS — D333 Benign neoplasm of cranial nerves: Secondary | ICD-10-CM

## 2019-09-08 ENCOUNTER — Ambulatory Visit: Payer: Medicare Other

## 2019-09-24 ENCOUNTER — Telehealth: Payer: Self-pay | Admitting: Internal Medicine

## 2019-09-24 DIAGNOSIS — C9210 Chronic myeloid leukemia, BCR/ABL-positive, not having achieved remission: Secondary | ICD-10-CM

## 2019-09-24 NOTE — Telephone Encounter (Signed)
C-Follow-up first week of March-MD; labs-CBC CMP; BCR ABL PCR; BCL-ABL FISH. [I ordered the labs]-schedule labs 2 weeks prior

## 2019-10-01 ENCOUNTER — Inpatient Hospital Stay: Payer: Medicare Other | Attending: Internal Medicine

## 2019-10-01 DIAGNOSIS — C9210 Chronic myeloid leukemia, BCR/ABL-positive, not having achieved remission: Secondary | ICD-10-CM | POA: Insufficient documentation

## 2019-10-08 ENCOUNTER — Encounter (INDEPENDENT_AMBULATORY_CARE_PROVIDER_SITE_OTHER): Payer: Self-pay

## 2019-10-08 ENCOUNTER — Other Ambulatory Visit: Payer: Self-pay

## 2019-10-08 ENCOUNTER — Inpatient Hospital Stay: Payer: Medicare Other

## 2019-10-08 DIAGNOSIS — C9210 Chronic myeloid leukemia, BCR/ABL-positive, not having achieved remission: Secondary | ICD-10-CM

## 2019-10-08 LAB — CBC WITH DIFFERENTIAL/PLATELET
Abs Immature Granulocytes: 0.04 10*3/uL (ref 0.00–0.07)
Basophils Absolute: 0 10*3/uL (ref 0.0–0.1)
Basophils Relative: 1 %
Eosinophils Absolute: 0.2 10*3/uL (ref 0.0–0.5)
Eosinophils Relative: 2 %
HCT: 27 % — ABNORMAL LOW (ref 39.0–52.0)
Hemoglobin: 9 g/dL — ABNORMAL LOW (ref 13.0–17.0)
Immature Granulocytes: 1 %
Lymphocytes Relative: 12 %
Lymphs Abs: 0.9 10*3/uL (ref 0.7–4.0)
MCH: 33.2 pg (ref 26.0–34.0)
MCHC: 33.3 g/dL (ref 30.0–36.0)
MCV: 99.6 fL (ref 80.0–100.0)
Monocytes Absolute: 0.6 10*3/uL (ref 0.1–1.0)
Monocytes Relative: 8 %
Neutro Abs: 5.8 10*3/uL (ref 1.7–7.7)
Neutrophils Relative %: 76 %
Platelets: 256 10*3/uL (ref 150–400)
RBC: 2.71 MIL/uL — ABNORMAL LOW (ref 4.22–5.81)
RDW: 15 % (ref 11.5–15.5)
WBC: 7.6 10*3/uL (ref 4.0–10.5)
nRBC: 0 % (ref 0.0–0.2)

## 2019-10-08 LAB — COMPREHENSIVE METABOLIC PANEL
ALT: 18 U/L (ref 0–44)
AST: 27 U/L (ref 15–41)
Albumin: 3.7 g/dL (ref 3.5–5.0)
Alkaline Phosphatase: 63 U/L (ref 38–126)
Anion gap: 9 (ref 5–15)
BUN: 23 mg/dL (ref 8–23)
CO2: 27 mmol/L (ref 22–32)
Calcium: 8.8 mg/dL — ABNORMAL LOW (ref 8.9–10.3)
Chloride: 99 mmol/L (ref 98–111)
Creatinine, Ser: 1.42 mg/dL — ABNORMAL HIGH (ref 0.61–1.24)
GFR calc Af Amer: 54 mL/min — ABNORMAL LOW (ref >60)
GFR calc non Af Amer: 47 mL/min — ABNORMAL LOW (ref >60)
Glucose, Bld: 82 mg/dL (ref 70–99)
Potassium: 3.6 mmol/L (ref 3.5–5.1)
Sodium: 135 mmol/L (ref 135–145)
Total Bilirubin: 0.7 mg/dL (ref 0.3–1.2)
Total Protein: 6.4 g/dL — ABNORMAL LOW (ref 6.5–8.1)

## 2019-10-14 LAB — BCR-ABL1 FISH
Cells Analyzed: 200
Cells Counted: 200

## 2019-10-15 ENCOUNTER — Ambulatory Visit: Payer: Medicare Other | Admitting: Internal Medicine

## 2019-10-15 LAB — BCR-ABL1, CML/ALL, PCR, QUANT: b2a2 transcript: 2.4466 %

## 2019-10-23 ENCOUNTER — Inpatient Hospital Stay: Payer: Medicare Other | Attending: Internal Medicine | Admitting: Internal Medicine

## 2019-10-23 ENCOUNTER — Other Ambulatory Visit: Payer: Self-pay

## 2019-10-23 VITALS — BP 176/84 | HR 74 | Temp 95.5°F | Wt 115.0 lb

## 2019-10-23 DIAGNOSIS — D649 Anemia, unspecified: Secondary | ICD-10-CM | POA: Diagnosis not present

## 2019-10-23 DIAGNOSIS — C9210 Chronic myeloid leukemia, BCR/ABL-positive, not having achieved remission: Secondary | ICD-10-CM | POA: Diagnosis present

## 2019-10-23 DIAGNOSIS — F0390 Unspecified dementia without behavioral disturbance: Secondary | ICD-10-CM | POA: Diagnosis not present

## 2019-10-23 DIAGNOSIS — N183 Chronic kidney disease, stage 3 unspecified: Secondary | ICD-10-CM | POA: Diagnosis not present

## 2019-10-23 NOTE — Assessment & Plan Note (Addendum)
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  #Discussed that unfortunately current therapy is not controlling CML transcripts.  However patient continues to be in hematologic response- [mild anemia-see below].  Given the poor tolerance of prior therapy/also the fact that patient BCR ABL transcripts to be trending down-I think is reasonable to continue imatinib 300 mg once a day.  Patient and wife understand that this is not standard of care-in general would recommend aggressive evaluation including bone marrow biopsy.  However given patient's borderline performance status-I think it is reasonable to continue 300 mg imatinib once a day.  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  On p.o. iron.  Will monitor for now.   # CKD- stage III- STABLE.  Recommend no nephrotoxic agents.   # Dementia/subacute stroke-MRI 4431 [Dr.Shah]-mild to [facility]  # I discussed regarding Covid precautions/and also discussed proceeding with Covid vaccination when available.  Discussed that unfortunately the data safety and efficacy of vaccination is unclear especially in patients with immunocompromised state.  However, I think the benefits of the vaccination outweigh the potential risks. Information re: Covid vaccination given to pt/ wife.   I spoke at length with the patient's wife, Alex Christensen- regarding the patient's clinical status/plan of care.  Family agreement.    # DISPOSITION:  # Follow up in 2 months-;2 weeks prior- labs- cbc/cmp/LDH;iron studies ferritin; LDH;  mutation analysis; FISH/PCR--Dr.B

## 2019-10-23 NOTE — Progress Notes (Signed)
[place] NOTE  Patient Care Team: [place] Pink, MD as PCP - General (Family Medicine) Cammie Sickle, MD as Consulting Physician (Hematology and Oncology)  CHIEF COMPLAINTS/PURPOSE OF CONSULTATION: [facility]   #  Oncology History Overview Note  # CHRONIC MYELOID LEUKEMIA- [4098]; [place], [place] [2019-Dr.SunithaVemulapalli; [place]; [place]; [phone]; on Imatinib 300 mg/day [2014-poor tolerance to [place]- GI issues; [place]-again poor tolerance]  # CKD [creat 1.4-1.6]; Mild anemia 9.4 - 10.   # Dementia  DIAGNOSIS: CML-Chronic  GOALS: control  CURRENT/MOST RECENT THERAPY: Imatinib.      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[place])  04/15/2019 Initial Diagnosis   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[place])      HISTORY OF PRESENTING ILLNESS: Patient a poor historian given dementia.   Alex Christensen 79 y.o.  male with mild to moderate dementia and history of CML on Gleevec is here for follow-up.  Patient denies any recent hospitalizations.  No falls.  As per the wife patient is physically doing well.  Continues to exercise at home.  No nausea no vomiting no weight loss.  Continues to have memory issues which are chronic not any worse..  Review of Systems  Constitutional: Negative for chills, diaphoresis, fever, malaise/fatigue and weight loss.  HENT: Negative for nosebleeds and sore throat.   Eyes: Negative for double vision.  Respiratory: Negative for cough, hemoptysis, sputum production, shortness of breath and wheezing.   Cardiovascular: Negative for chest pain, palpitations, orthopnea and leg swelling.  Gastrointestinal: Negative for abdominal pain, blood in stool, constipation, diarrhea, heartburn, melena, nausea and vomiting.  Genitourinary: Negative for dysuria, frequency and urgency.  Musculoskeletal: Positive for back pain and joint pain.  Skin: Negative.  Negative for itching and rash.  Neurological: Negative for dizziness, tingling, focal weakness,  weakness and headaches.  Endo/Heme/Allergies: Does not bruise/bleed easily.  Psychiatric/Behavioral: Positive for memory loss. Negative for depression. The patient is not nervous/anxious and does not have insomnia.      MEDICAL HISTORY:  Past Medical History:  Diagnosis Date  . High blood pressure   . Leukemia, chronic myeloid ([place])     SURGICAL HISTORY: Past Surgical History:  Procedure Laterality Date  . TOOTH EXTRACTION      SOCIAL HISTORY: Social History   Socioeconomic History  . Marital status: Married    Spouse name: Not on file  . Number of children: Not on file  . Years of education: Not on file  . Highest education level: Not on file  Occupational History  . Not on file  Tobacco Use  . Smoking status: Former Research scientist (life sciences)  . Smokeless tobacco: Never Used  Substance and Sexual Activity  . Alcohol use: Yes  . Drug use: Not on file  . Sexual activity: Not on file  Other Topics Concern  . Not on file  Social History Narrative   Patient lives at home with his wife.  No smoking.  No alcohol.  Used to live in [place]; moved to [place] a year ago.    Social Determinants of Health   Financial Resource Strain:   . Difficulty of Paying Living Expenses:   Food Insecurity:   . Worried About Charity fundraiser in the Last Year:   . Arboriculturist in the Last Year:   Transportation Needs:   . Film/video editor (Medical):   Marland Kitchen Lack of Transportation (Non-Medical):   Physical Activity:   . Days of Exercise per Week:   . Minutes of Exercise per Session:   Stress:   .  Feeling of Stress :   Social Connections:   . Frequency of Communication with Friends and Family:   . Frequency of Social Gatherings with Friends and Family:   . Attends Religious Services:   . Active Member of Clubs or Organizations:   . Attends Archivist Meetings:   Marland Kitchen Marital Status:   Intimate Partner Violence:   . Fear of Current or Ex-Partner:   . Emotionally Abused:   Marland Kitchen  Physically Abused:   . Sexually Abused:     FAMILY HISTORY: No family history on file.  ALLERGIES:  has no allergies on file.  MEDICATIONS:  Current Outpatient Medications  Medication Sig Dispense Refill  . aspirin 325 MG tablet Take 325 mg by mouth daily.    Marland Kitchen aspirin EC 81 MG tablet Take by mouth.    Marland Kitchen atorvastatin (LIPITOR) 10 MG tablet Take by mouth.    . Folic Acid-Cholecalciferol 08-4998 MG-UNIT TABS Take by mouth.    . Ibuprofen 200 MG CAPS Take by mouth.    . imatinib (GLEEVEC) 100 MG tablet Take by mouth.    . Melatonin 10 MG TABS Take by mouth.    . meloxicam (MOBIC) 15 MG tablet Take by mouth.    . naproxen sodium (ALEVE) 220 MG tablet Take by mouth.    . traZODone (DESYREL) 50 MG tablet Take by mouth.     No current facility-administered medications for this visit.      Marland Kitchen  PHYSICAL EXAMINATION: ECOG PERFORMANCE STATUS: 0 - Asymptomatic  Vitals:   10/23/19 1503  BP: (!) 176/84  Pulse: 74  Temp: (!) 95.5 F (35.3 C)   Filed Weights   10/23/19 1503  Weight: 115 lb (52.2 kg)    Physical Exam  Constitutional: He is oriented to person, place, and time and well-developed, well-nourished, and in no distress.  He walks with a rolling walker.  Accompanied by his wife.  HENT:  Head: Normocephalic and atraumatic.  Mouth/Throat: Oropharynx is clear and moist. No oropharyngeal exudate.  Eyes: Pupils are equal, round, and reactive to light.  Cardiovascular: Normal rate and regular rhythm.  Pulmonary/Chest: No respiratory distress. He has no wheezes.  Abdominal: Soft. Bowel sounds are normal. He exhibits no distension and no mass. There is no abdominal tenderness. There is no rebound and no guarding.  Musculoskeletal:        General: No tenderness or edema. Normal range of motion.     Cervical back: Normal range of motion and neck supple.  Neurological: He is alert and oriented to person, place, and time.  Skin: Skin is warm.  Psychiatric: Affect normal.     LABORATORY DATA:  I have reviewed the data as listed Lab Results  Component Value Date   WBC 7.6 10/08/2019   HGB 9.0 (L) 10/08/2019   HCT 27.0 (L) 10/08/2019   MCV 99.6 10/08/2019   PLT 256 10/08/2019   Recent Labs    04/15/19 1551 04/15/19 1551 06/12/19 1455 08/19/19 1412 10/08/19 1340  NA 134*  --   --  133* 135  K 3.9  --   --  3.7 3.6  CL 102  --   --  99 99  CO2 25  --   --  25 27  GLUCOSE 96  --   --  110* 82  BUN 24*  --   --  22 23  CREATININE 1.44*   < > 1.70* 1.45* 1.42*  CALCIUM 9.2  --   --  9.1  8.8*  GFRNONAA 46*  --   --  45* 47*  GFRAA 53*  --   --  53* 54*  PROT 6.7  --   --  6.3* 6.4*  ALBUMIN 4.3  --   --  4.0 3.7  AST 28  --   --  27 27  ALT 25  --   --  18 18  ALKPHOS 59  --   --  63 63  BILITOT 0.6  --   --  0.6 0.7   < > = values in this interval not displayed.    RADIOGRAPHIC STUDIES: I have personally reviewed the radiological images as listed and agreed with the findings in the report. No results found.  ASSESSMENT & PLAN:   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[place]) # Chronic myeloid leukemia-chronic phase-imatinib 300 mg once a day; since 2001; NOV 2020- NOT on molecular response; March 2021-cytogenetic relapse.  #Discussed that unfortunately current therapy is not controlling CML transcripts.  However patient continues to be in hematologic response- [mild anemia-see below].  Given the poor tolerance of prior therapy/also the fact that patient BCR ABL transcripts to be trending down-I think is reasonable to continue imatinib 300 mg once a day.  Patient and wife understand that this is not standard of care-in general would recommend aggressive evaluation including bone marrow biopsy.  However given patient's borderline performance status-I think it is reasonable to continue 300 mg imatinib once a day.  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  On p.o. iron.  Will monitor for now.   # CKD- stage III-  STABLE.  Recommend no nephrotoxic agents.   # Dementia/subacute stroke-MRI 7544 [Dr.Shah]-mild to [facility]  # I discussed regarding Covid precautions/and also discussed proceeding with Covid vaccination when available.  Discussed that unfortunately the data safety and efficacy of vaccination is unclear especially in patients with immunocompromised state.  However, I think the benefits of the vaccination outweigh the potential risks. Information re: Covid vaccination given to pt/ wife.   I spoke at length with the patient's wife, [name]- regarding the patient's clinical status/plan of care.  Family agreement.    # DISPOSITION:  # Follow up in 2 months-;2 weeks prior- labs- cbc/cmp/LDH;iron studies ferritin; LDH;  mutation analysis; FISH/PCR--Dr.B  All questions were answered. The patient knows to call the clinic with any problems, questions or concerns.    Cammie Sickle, MD 10/26/2019 12:32 PM

## 2019-12-09 ENCOUNTER — Inpatient Hospital Stay: Payer: Medicare Other | Attending: Internal Medicine

## 2019-12-09 ENCOUNTER — Other Ambulatory Visit: Payer: Self-pay

## 2019-12-09 DIAGNOSIS — C9210 Chronic myeloid leukemia, BCR/ABL-positive, not having achieved remission: Secondary | ICD-10-CM

## 2019-12-09 DIAGNOSIS — D649 Anemia, unspecified: Secondary | ICD-10-CM | POA: Diagnosis not present

## 2019-12-09 LAB — COMPREHENSIVE METABOLIC PANEL
ALT: 18 U/L (ref 0–44)
AST: 28 U/L (ref 15–41)
Albumin: 3.9 g/dL (ref 3.5–5.0)
Alkaline Phosphatase: 68 U/L (ref 38–126)
Anion gap: 10 (ref 5–15)
BUN: 17 mg/dL (ref 8–23)
CO2: 26 mmol/L (ref 22–32)
Calcium: 8.8 mg/dL — ABNORMAL LOW (ref 8.9–10.3)
Chloride: 97 mmol/L — ABNORMAL LOW (ref 98–111)
Creatinine, Ser: 1.3 mg/dL — ABNORMAL HIGH (ref 0.61–1.24)
GFR calc Af Amer: 60 mL/min — ABNORMAL LOW (ref >60)
GFR calc non Af Amer: 52 mL/min — ABNORMAL LOW (ref >60)
Glucose, Bld: 99 mg/dL (ref 70–99)
Potassium: 3.3 mmol/L — ABNORMAL LOW (ref 3.5–5.1)
Sodium: 133 mmol/L — ABNORMAL LOW (ref 135–145)
Total Bilirubin: 0.6 mg/dL (ref 0.3–1.2)
Total Protein: 6.5 g/dL (ref 6.5–8.1)

## 2019-12-09 LAB — CBC WITH DIFFERENTIAL/PLATELET
Abs Immature Granulocytes: 0.02 10*3/uL (ref 0.00–0.07)
Basophils Absolute: 0.1 10*3/uL (ref 0.0–0.1)
Basophils Relative: 1 %
Eosinophils Absolute: 0.3 10*3/uL (ref 0.0–0.5)
Eosinophils Relative: 4 %
HCT: 28.3 % — ABNORMAL LOW (ref 39.0–52.0)
Hemoglobin: 10 g/dL — ABNORMAL LOW (ref 13.0–17.0)
Immature Granulocytes: 0 %
Lymphocytes Relative: 14 %
Lymphs Abs: 1 10*3/uL (ref 0.7–4.0)
MCH: 34.1 pg — ABNORMAL HIGH (ref 26.0–34.0)
MCHC: 35.3 g/dL (ref 30.0–36.0)
MCV: 96.6 fL (ref 80.0–100.0)
Monocytes Absolute: 0.8 10*3/uL (ref 0.1–1.0)
Monocytes Relative: 11 %
Neutro Abs: 5.2 10*3/uL (ref 1.7–7.7)
Neutrophils Relative %: 70 %
Platelets: 240 10*3/uL (ref 150–400)
RBC: 2.93 MIL/uL — ABNORMAL LOW (ref 4.22–5.81)
RDW: 14.1 % (ref 11.5–15.5)
WBC: 7.5 10*3/uL (ref 4.0–10.5)
nRBC: 0 % (ref 0.0–0.2)

## 2019-12-09 LAB — IRON AND TIBC
Iron: 82 ug/dL (ref 45–182)
Saturation Ratios: 27 % (ref 17.9–39.5)
TIBC: 307 ug/dL (ref 250–450)
UIBC: 225 ug/dL

## 2019-12-09 LAB — FERRITIN: Ferritin: 222 ng/mL (ref 24–336)

## 2019-12-09 LAB — LACTATE DEHYDROGENASE: LDH: 212 U/L — ABNORMAL HIGH (ref 98–192)

## 2019-12-14 LAB — BCR-ABL1, CML/ALL, PCR, QUANT: b2a2 transcript: 1.9765 %

## 2019-12-17 ENCOUNTER — Other Ambulatory Visit: Payer: Self-pay

## 2019-12-17 ENCOUNTER — Emergency Department (HOSPITAL_COMMUNITY): Payer: Medicare Other

## 2019-12-17 ENCOUNTER — Inpatient Hospital Stay (HOSPITAL_COMMUNITY)
Admission: EM | Admit: 2019-12-17 | Discharge: 2019-12-23 | DRG: 062 | Disposition: A | Discharge status: Rehabilitation Facility | Payer: Medicare Other | Attending: Neurology | Admitting: Neurology

## 2019-12-17 DIAGNOSIS — C9210 Chronic myeloid leukemia, BCR/ABL-positive, not having achieved remission: Secondary | ICD-10-CM | POA: Diagnosis present

## 2019-12-17 DIAGNOSIS — E861 Hypovolemia: Secondary | ICD-10-CM

## 2019-12-17 DIAGNOSIS — H933X1 Disorders of right acoustic nerve: Secondary | ICD-10-CM | POA: Diagnosis present

## 2019-12-17 DIAGNOSIS — I6602 Occlusion and stenosis of left middle cerebral artery: Secondary | ICD-10-CM | POA: Diagnosis not present

## 2019-12-17 DIAGNOSIS — Z79899 Other long term (current) drug therapy: Secondary | ICD-10-CM

## 2019-12-17 DIAGNOSIS — R4189 Other symptoms and signs involving cognitive functions and awareness: Secondary | ICD-10-CM | POA: Diagnosis not present

## 2019-12-17 DIAGNOSIS — I639 Cerebral infarction, unspecified: Secondary | ICD-10-CM

## 2019-12-17 DIAGNOSIS — Z87891 Personal history of nicotine dependence: Secondary | ICD-10-CM | POA: Diagnosis not present

## 2019-12-17 DIAGNOSIS — D649 Anemia, unspecified: Secondary | ICD-10-CM | POA: Diagnosis present

## 2019-12-17 DIAGNOSIS — N1831 Chronic kidney disease, stage 3a: Secondary | ICD-10-CM | POA: Diagnosis present

## 2019-12-17 DIAGNOSIS — E7800 Pure hypercholesterolemia, unspecified: Secondary | ICD-10-CM | POA: Diagnosis not present

## 2019-12-17 DIAGNOSIS — Z20822 Contact with and (suspected) exposure to covid-19: Secondary | ICD-10-CM | POA: Diagnosis present

## 2019-12-17 DIAGNOSIS — E871 Hypo-osmolality and hyponatremia: Secondary | ICD-10-CM | POA: Diagnosis not present

## 2019-12-17 DIAGNOSIS — E785 Hyperlipidemia, unspecified: Secondary | ICD-10-CM | POA: Diagnosis present

## 2019-12-17 DIAGNOSIS — I63519 Cerebral infarction due to unspecified occlusion or stenosis of unspecified middle cerebral artery: Secondary | ICD-10-CM | POA: Diagnosis not present

## 2019-12-17 DIAGNOSIS — I6389 Other cerebral infarction: Secondary | ICD-10-CM | POA: Diagnosis not present

## 2019-12-17 DIAGNOSIS — Z8673 Personal history of transient ischemic attack (TIA), and cerebral infarction without residual deficits: Secondary | ICD-10-CM | POA: Diagnosis not present

## 2019-12-17 DIAGNOSIS — N183 Chronic kidney disease, stage 3 unspecified: Secondary | ICD-10-CM | POA: Diagnosis present

## 2019-12-17 DIAGNOSIS — D333 Benign neoplasm of cranial nerves: Secondary | ICD-10-CM | POA: Diagnosis present

## 2019-12-17 DIAGNOSIS — I129 Hypertensive chronic kidney disease with stage 1 through stage 4 chronic kidney disease, or unspecified chronic kidney disease: Secondary | ICD-10-CM | POA: Diagnosis present

## 2019-12-17 DIAGNOSIS — Z7982 Long term (current) use of aspirin: Secondary | ICD-10-CM

## 2019-12-17 DIAGNOSIS — G454 Transient global amnesia: Secondary | ICD-10-CM | POA: Diagnosis not present

## 2019-12-17 DIAGNOSIS — G459 Transient cerebral ischemic attack, unspecified: Secondary | ICD-10-CM | POA: Diagnosis present

## 2019-12-17 DIAGNOSIS — I9589 Other hypotension: Secondary | ICD-10-CM

## 2019-12-17 DIAGNOSIS — E876 Hypokalemia: Secondary | ICD-10-CM | POA: Diagnosis present

## 2019-12-17 DIAGNOSIS — I10 Essential (primary) hypertension: Secondary | ICD-10-CM | POA: Diagnosis present

## 2019-12-17 LAB — CBC
HCT: 28.8 % — ABNORMAL LOW (ref 39.0–52.0)
Hemoglobin: 10 g/dL — ABNORMAL LOW (ref 13.0–17.0)
MCH: 34.1 pg — ABNORMAL HIGH (ref 26.0–34.0)
MCHC: 34.7 g/dL (ref 30.0–36.0)
MCV: 98.3 fL (ref 80.0–100.0)
Platelets: 278 10*3/uL (ref 150–400)
RBC: 2.93 MIL/uL — ABNORMAL LOW (ref 4.22–5.81)
RDW: 14 % (ref 11.5–15.5)
WBC: 6.8 10*3/uL (ref 4.0–10.5)
nRBC: 0 % (ref 0.0–0.2)

## 2019-12-17 LAB — COMPREHENSIVE METABOLIC PANEL
ALT: 17 U/L (ref 0–44)
AST: 31 U/L (ref 15–41)
Albumin: 3.7 g/dL (ref 3.5–5.0)
Alkaline Phosphatase: 70 U/L (ref 38–126)
Anion gap: 15 (ref 5–15)
BUN: 19 mg/dL (ref 8–23)
CO2: 20 mmol/L — ABNORMAL LOW (ref 22–32)
Calcium: 8.8 mg/dL — ABNORMAL LOW (ref 8.9–10.3)
Chloride: 101 mmol/L (ref 98–111)
Creatinine, Ser: 1.52 mg/dL — ABNORMAL HIGH (ref 0.61–1.24)
GFR calc Af Amer: 49 mL/min — ABNORMAL LOW (ref >60)
GFR calc non Af Amer: 43 mL/min — ABNORMAL LOW (ref >60)
Glucose, Bld: 101 mg/dL — ABNORMAL HIGH (ref 70–99)
Potassium: 3.3 mmol/L — ABNORMAL LOW (ref 3.5–5.1)
Sodium: 136 mmol/L (ref 135–145)
Total Bilirubin: 0.7 mg/dL (ref 0.3–1.2)
Total Protein: 6.1 g/dL — ABNORMAL LOW (ref 6.5–8.1)

## 2019-12-17 LAB — POCT I-STAT, CHEM 8
BUN: 19 mg/dL (ref 8–23)
Calcium, Ion: 1.08 mmol/L — ABNORMAL LOW (ref 1.15–1.40)
Chloride: 102 mmol/L (ref 98–111)
Creatinine, Ser: 1.4 mg/dL — ABNORMAL HIGH (ref 0.61–1.24)
Glucose, Bld: 94 mg/dL (ref 70–99)
HCT: 28 % — ABNORMAL LOW (ref 39.0–52.0)
Hemoglobin: 9.5 g/dL — ABNORMAL LOW (ref 13.0–17.0)
Potassium: 3.1 mmol/L — ABNORMAL LOW (ref 3.5–5.1)
Sodium: 134 mmol/L — ABNORMAL LOW (ref 135–145)
TCO2: 20 mmol/L — ABNORMAL LOW (ref 22–32)

## 2019-12-17 LAB — DIFFERENTIAL
Abs Immature Granulocytes: 0.02 10*3/uL (ref 0.00–0.07)
Basophils Absolute: 0.1 10*3/uL (ref 0.0–0.1)
Basophils Relative: 1 %
Eosinophils Absolute: 0.3 10*3/uL (ref 0.0–0.5)
Eosinophils Relative: 4 %
Immature Granulocytes: 0 %
Lymphocytes Relative: 25 %
Lymphs Abs: 1.7 10*3/uL (ref 0.7–4.0)
Monocytes Absolute: 0.6 10*3/uL (ref 0.1–1.0)
Monocytes Relative: 8 %
Neutro Abs: 4.2 10*3/uL (ref 1.7–7.7)
Neutrophils Relative %: 62 %

## 2019-12-17 LAB — PROTIME-INR
INR: 1 (ref 0.8–1.2)
Prothrombin Time: 12.9 seconds (ref 11.4–15.2)

## 2019-12-17 LAB — ETHANOL: Alcohol, Ethyl (B): <10 mg/dL (ref <10)

## 2019-12-17 LAB — RESPIRATORY PANEL BY RT PCR (FLU A&B, COVID)
Influenza A by PCR: NEGATIVE
Influenza B by PCR: NEGATIVE
SARS Coronavirus 2 by RT PCR: NEGATIVE

## 2019-12-17 LAB — CBG MONITORING, ED: Glucose-Capillary: 94 mg/dL (ref 70–99)

## 2019-12-17 LAB — APTT: aPTT: 27 seconds (ref 24–36)

## 2019-12-17 MED ORDER — ALTEPLASE (STROKE) FULL DOSE INFUSION
0.9000 mg/kg | Freq: Once | INTRAVENOUS | Status: AC
  Administered 2019-12-17: 47.8 mg via INTRAVENOUS
  Filled 2019-12-17: qty 100

## 2019-12-17 MED ORDER — LORAZEPAM 2 MG/ML IJ SOLN
INTRAMUSCULAR | Status: AC
  Administered 2019-12-17: 2 mg
  Filled 2019-12-17: qty 1

## 2019-12-17 MED ORDER — IOHEXOL 350 MG/ML SOLN
100.0000 mL | Freq: Once | INTRAVENOUS | Status: AC | PRN
  Administered 2019-12-17: 100 mL via INTRAVENOUS

## 2019-12-17 MED ORDER — CLEVIDIPINE BUTYRATE 0.5 MG/ML IV EMUL: 0.0000 mg/h | INTRAVENOUS | Status: DC

## 2019-12-17 MED ORDER — SODIUM CHLORIDE 0.9 % IV SOLN: INTRAVENOUS | Status: DC

## 2019-12-17 MED ORDER — ACETAMINOPHEN 160 MG/5ML PO SOLN: 650.0000 mg | ORAL | Status: DC | PRN

## 2019-12-17 MED ORDER — SODIUM CHLORIDE 0.9 % IV SOLN
50.0000 mL | Freq: Once | INTRAVENOUS | Status: AC
  Administered 2019-12-17: 50 mL via INTRAVENOUS

## 2019-12-17 MED ORDER — IMATINIB MESYLATE 100 MG PO TABS
300.0000 mg | ORAL_TABLET | Freq: Every day | ORAL | Status: DC
  Filled 2019-12-17: qty 3

## 2019-12-17 MED ORDER — SENNOSIDES-DOCUSATE SODIUM 8.6-50 MG PO TABS: 1.0000 | ORAL_TABLET | Freq: Every evening | ORAL | Status: DC | PRN

## 2019-12-17 MED ORDER — ACETAMINOPHEN 650 MG RE SUPP: 650.0000 mg | RECTAL | Status: DC | PRN

## 2019-12-17 MED ORDER — ACETAMINOPHEN 325 MG PO TABS: 650.0000 mg | ORAL_TABLET | ORAL | Status: DC | PRN

## 2019-12-17 MED ORDER — TRAZODONE HCL 50 MG PO TABS
75.0000 mg | ORAL_TABLET | Freq: Every day | ORAL | Status: DC
  Administered 2019-12-18 – 2019-12-22 (×5): 75 mg via ORAL
  Filled 2019-12-17 (×5): qty 2

## 2019-12-17 MED ORDER — PANTOPRAZOLE SODIUM 40 MG IV SOLR: 40.0000 mg | Freq: Every day | INTRAVENOUS | Status: DC

## 2019-12-17 MED ORDER — LORAZEPAM 2 MG/ML IJ SOLN
INTRAMUSCULAR | Status: AC
  Filled 2019-12-17: qty 1

## 2019-12-17 MED ORDER — STROKE: EARLY STAGES OF RECOVERY BOOK
Freq: Once | Status: DC
  Filled 2019-12-17: qty 1

## 2019-12-17 MED ORDER — FERROUS SULFATE 325 (65 FE) MG PO TABS
324.0000 mg | ORAL_TABLET | Freq: Every day | ORAL | Status: DC
  Administered 2019-12-18 – 2019-12-23 (×6): 324 mg via ORAL
  Filled 2019-12-17 (×6): qty 1

## 2019-12-17 MED ORDER — LABETALOL HCL 5 MG/ML IV SOLN
INTRAVENOUS | Status: AC | PRN
  Administered 2019-12-17: 10 mg via INTRAVENOUS

## 2019-12-17 NOTE — Code Documentation (Signed)
Patient last known well at 1715.  Per wife they were sitting and talking when he suddenly had difficulty speaking.  [facility] called code Stroke at  1751  Patient arrived at 38.  Dr Cheral Marker assess patient upon arrival.  Patient had projectile vomiting .  4mg  zofran given IV.  Stat labs and head CT done.    Patient very restless and figedty.  2mg  Ativan given x2 while in CT.    NIHSS 12  Unable to answer question or follow commands.  Left gaze preference and some inattention to the right.  Severe aphasia, dysarthria.   10 mg Labetalol given for BP 233/89  HR 101  TPA started at 1840 BP 157/77, 164/86,  121/86 Patient drowsy   Plan Admit to ICU, stroke MD

## 2019-12-17 NOTE — Consult Note (Deleted)
Referring Physician: Dr. Ronnald Nian    Chief Complaint: Acute onset of aphasia, agitation and right hemineglect  HPI: Alex Christensen is an 79 y.o. male presenting with acute onset of aphasia, agitation and right hemineglect. He was at home having dinner with his wife when he suddenly became unable to speak, with confusion. LKN was the same as TOSO: 5:15 PM. EMS was called, and on arrival his CBG was 77. En route, the patient began to projectile vomit. He remained aphasic en route and continued to be unable to speak on arrival.   He has no prior history of stroke. He has a history of HTN and CML.   Home meds include ASA and atorvastatin.   LSN: 5:15 PM tPA Given: Yes  Past Medical History:  Diagnosis Date  . High blood pressure   . Leukemia, chronic myeloid ([place])     Past Surgical History:  Procedure Laterality Date  . TOOTH EXTRACTION      No family history on file. Social History:  reports that he has quit smoking. He has never used smokeless tobacco. He reports current alcohol use. No history on file for drug.  Allergies: Not on File  Medications:  Prior to Admission:  Medications Prior to Admission  Medication Sig Dispense Refill Last Dose  . aspirin 325 MG tablet Take 325 mg by mouth daily.   12/17/2019 at Unknown time  . atorvastatin (LIPITOR) 10 MG tablet Take 10 mg by mouth daily.    12/16/2019 at Unknown time  . ferrous sulfate 324 MG TBEC Take 324 mg by mouth daily.   12/16/2019 at Unknown time  . imatinib (GLEEVEC) 100 MG tablet Take 300 mg by mouth daily.    12/16/2019 at Unknown time  . traZODone (DESYREL) 50 MG tablet Take 75 mg by mouth at bedtime.    12/16/2019 at Unknown time    ROS: Unable to obtain due to aphasia.   Physical Examination: There were no vitals taken for this visit.  HEENT: [place]/AT Lungs: Respirations unlabored Ext: No edema  Neurologic Examination: Mental Status: Awake, agitated and globally aphasic. Does not answer any questions or follow any  commands. Seems to attend less to his right side than his left. Will swat purposefully at staff when attempting sternal rub. Purposefully holds onto bag that was given to him and brings it towards his face when he appears about to vomit.  Cranial Nerves: II:  Blinks consistently to threat on the left, but not consistently to threat on the right. PERRL.  III,IV, VI: No ptosis. Will gaze to the left and right.  V,VII: Face is symmetric. Unable to formally test sensation, but will close eyes when eyelids are touched on left and right VIII: Unable to assess IX,X: Will not open mouth for assessment XI: Head is midline XII: Does not protrude tongue to command.  Motor/Sensory: Moves upper and lower extremities briskly, purposefully and symmetrically to noxious stimuli, without any weakness noted.  Deep Tendon Reflexes:  2+ bilateral brachioradialis and patellae.  Plantars: Upgoing bilaterally  Cerebellar: Unable to assess.  Gait: Unable to assess   Results for orders placed or performed during the hospital encounter of 12/17/19 (from the past 48 hour(s))  CBG monitoring, ED     Status: None   Collection Time: 12/17/19  6:05 PM  Result Value Ref Range   Glucose-Capillary 94 70 - 99 mg/dL    Comment: Glucose reference range applies only to samples taken after fasting for at least 8 hours.  I-STAT,  chem 8     Status: Abnormal   Collection Time: 12/17/19  6:10 PM  Result Value Ref Range   Sodium 134 (L) 135 - 145 mmol/L   Potassium 3.1 (L) 3.5 - 5.1 mmol/L   Chloride 102 98 - 111 mmol/L   BUN 19 8 - 23 mg/dL   Creatinine, Ser 1.40 (H) 0.61 - 1.24 mg/dL   Glucose, Bld 94 70 - 99 mg/dL    Comment: Glucose reference range applies only to samples taken after fasting for at least 8 hours.   Calcium, Ion 1.08 (L) 1.15 - 1.40 mmol/L   TCO2 20 (L) 22 - 32 mmol/L   Hemoglobin 9.5 (L) 13.0 - 17.0 g/dL   HCT 28.0 (L) 39.0 - 52.0 %   Imaging studies were personally reviewed:  CT head: Nondiagnostic  study through the skull base and posterior fossa. Otherwise, no acute intracranial hemorrhage or evidence of acute infarction. ASPECT score is 10.  Advanced chronic microvascular ischemic changes.  CTA head and neck: No large vessel occlusion. No hemodynamically significant stenosis in the neck. 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  Assessment: 79 y.o. male presenting with acute global aphasia.  1. 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  2. CT head with no acute ICH or evidence of acute infarction.  3. CTA head and neck with no LVO. 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  4. Stroke Risk Factors - HTN 5. CML  Plan: 1. After comprehensive review of possible contraindications, he has no absolute contraindications to tPA administration. The patient is a tPA candidate. Discussed extensively the risks/benefits of tPA treatment vs. no treatment with the patient's wife over speakerphone, including risks of hemorrhage and death with tPA administration versus worse overall outcomes on average in patients within the tPA time window who are not administered tPA. The patient's aphasia precludes meaningful medical decision making on his part at this time. Overall benefits of tPA regarding long-term prognosis are felt to outweigh risks. The patient's wife expressed understanding and wish to proceed with tPA. Consent was witnessed by Pharmacist.  2. Following IV tPA, will admit to the Neuro ICU under the Neurology service. Post-tPA order set to include frequent neuro checks and BP management.  3. No antiplatelet medications or anticoagulants for at least 24 hours following tPA.  4. DVT prophylaxis with SCDs.  5. Telemetry monitoring. 6. Classifiable as having failed ASA  monotherapy. Will need escalation of antiplatelet therapy to Plavix if follow up CT at 24 hours is negative for hemorrhagic conversion. 7. TTE.  8. MRI brain.  9. Fasting lipid panel, HgbA1c 10. PT/OT/Speech.  11. NPO until passes swallow evaluation.  12. Continue Gleevec for his CML  60 minutes spent in the emergent neurological evaluation and management of this critically ill acute stroke patient.   @Electronically  signed: Dr. Kerney Elbe  12/17/2019, 6:16 PM

## 2019-12-17 NOTE — Progress Notes (Signed)
PHARMACIST CODE STROKE RESPONSE  Notified to mix tPA at 1833 by Dr. Cheral Marker Delivered tPA to RN at 1839  tPA dose = 4.8mg  bolus over 1 minute followed by 43mg  for a total dose of 47.8mg  over 1 hour  Issues/delays encountered (if applicable): n/a  Duanne Limerick PharmD. BCPS  12/17/19 6:47 PM

## 2019-12-17 NOTE — ED Notes (Signed)
Pt asleep and SpO2 decreased to 88%, RN placed pt on 2L O2 via [place], SpO2 now 98%

## 2019-12-17 NOTE — H&P (Signed)
HISTORY AND PHYSICAL EXAMINATION  Referring Physician: Dr. Ronnald Nian    Chief Complaint: Acute onset of aphasia, agitation and right hemineglect  HPI: Alex Christensen is an 79 y.o. male presenting with acute onset of aphasia, agitation and right hemineglect. He was at home having dinner with his wife when he suddenly became unable to speak, with confusion. LKN was the same as TOSO: 5:15 PM. EMS was called, and on arrival his CBG was 77. En route, the patient began to projectile vomit. He remained aphasic en route and continued to be unable to speak on arrival.   He has no prior history of stroke. He has a history of HTN and CML.   Home meds include ASA and atorvastatin.   LSN: 5:15 PM tPA Given: Yes  Past Medical History:  Diagnosis Date  . High blood pressure   . 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 TOOTH EXTRACTION      No family history on file. Social History:  reports that he has quit smoking. He has never used smokeless tobacco. He reports current alcohol use. No history on file for drug.  Allergies: No Known Allergies  Medications:  Prior to Admission:  Medications Prior to Admission  Medication Sig Dispense Refill Last Dose  . aspirin 325 MG tablet Take 325 mg by mouth daily.   12/17/2019 at Unknown time  . atorvastatin (LIPITOR) 10 MG tablet Take 10 mg by mouth daily.    12/16/2019 at Unknown time  . ferrous sulfate 324 MG TBEC Take 324 mg by mouth daily.   12/16/2019 at Unknown time  . imatinib (GLEEVEC) 100 MG tablet Take 300 mg by mouth daily.    12/16/2019 at Unknown time  . traZODone (DESYREL) 50 MG tablet Take 75 mg by mouth at bedtime.    12/16/2019 at Unknown time    ROS: Unable to obtain due to aphasia.   Physical Examination: Blood pressure (!) 151/108, pulse 69, temperature 97.8 F (36.6 C), temperature source Oral, resp. rate 17, height 5\' 5"  (1.651 m), weight 53.1 kg, SpO2 99 %.  HEENT: [place]/AT Lungs:  Respirations unlabored Ext: No edema  Neurologic Examination: Mental Status: Awake, agitated and globally aphasic. Does not answer any questions or follow any commands. Seems to attend less to his right side than his left. Will swat purposefully at staff when attempting sternal rub. Purposefully holds onto bag that was given to him and brings it towards his face when he appears about to vomit.  Cranial Nerves: II:  Blinks consistently to threat on the left, but not consistently to threat on the right. PERRL.  III,IV, VI: No ptosis. Will gaze to the left and right.  V,VII: Face is symmetric. Unable to formally test sensation, but will close eyes when eyelids are touched on left and right VIII: Unable to assess IX,X: Will not open mouth for assessment XI: Head is midline XII: Does not protrude tongue to command.  Motor/Sensory: Moves upper and lower extremities briskly, purposefully and symmetrically to noxious stimuli, without any weakness noted.  Deep Tendon Reflexes:  2+ bilateral brachioradialis and patellae.  Plantars: Upgoing bilaterally  Cerebellar: Unable to assess.  Gait: Unable to assess   Results for orders placed or performed during the hospital encounter of 12/17/19 (from the past 48 hour(s))  CBG monitoring, ED     Status: None   Collection Time: 12/17/19  6:05 PM  Result Value Ref Range   Glucose-Capillary 94  70 - 99 mg/dL    Comment: Glucose reference range applies only to samples taken after fasting for at least 8 hours.  Ethanol     Status: None   Collection Time: 12/17/19  6:07 PM  Result Value Ref Range   Alcohol, Ethyl (B) <10 <10 mg/dL    Comment: (NOTE) Lowest detectable limit for serum alcohol is 10 mg/dL. For medical purposes only. Performed at [facility], [place] [address] [place], [place] [address]   Protime-INR     Status: None   Collection Time: 12/17/19  6:07 PM  Result Value Ref Range   Prothrombin Time 12.9 11.4 - 15.2 seconds   INR 1.0 0.8  - 1.2    Comment: (NOTE) INR goal varies based on device and disease states. Performed at [facility], [place] [address] [place], [place] [address]   APTT     Status: None   Collection Time: 12/17/19  6:07 PM  Result Value Ref Range   aPTT 27 24 - 36 seconds    Comment: Performed at [facility] [address], [place], [place] [address]  CBC     Status: Abnormal   Collection Time: 12/17/19  6:07 PM  Result Value Ref Range   WBC 6.8 4.0 - 10.5 K/uL   RBC 2.93 (L) 4.22 - 5.81 MIL/uL   Hemoglobin 10.0 (L) 13.0 - 17.0 g/dL   HCT 28.8 (L) 39.0 - 52.0 %   MCV 98.3 80.0 - 100.0 fL   MCH 34.1 (H) 26.0 - 34.0 pg   MCHC 34.7 30.0 - 36.0 g/dL   RDW 14.0 11.5 - 15.5 %   Platelets 278 150 - 400 K/uL   nRBC 0.0 0.0 - 0.2 %    Comment: Performed at [facility], [place] [address] [place], [place] [address]  Differential     Status: None   Collection Time: 12/17/19  6:07 PM  Result Value Ref Range   Neutrophils Relative % 62 %   Neutro Abs 4.2 1.7 - 7.7 K/uL   Lymphocytes Relative 25 %   Lymphs Abs 1.7 0.7 - 4.0 K/uL   Monocytes Relative 8 %   Monocytes Absolute 0.6 0.1 - 1.0 K/uL   Eosinophils Relative 4 %   Eosinophils Absolute 0.3 0.0 - 0.5 K/uL   Basophils Relative 1 %   Basophils Absolute 0.1 0.0 - 0.1 K/uL   Immature Granulocytes 0 %   Abs Immature Granulocytes 0.02 0.00 - 0.07 K/uL    Comment: Performed at [facility] [address], [place], [place] [address]  Comprehensive metabolic panel     Status: Abnormal   Collection Time: 12/17/19  6:07 PM  Result Value Ref Range   Sodium 136 135 - 145 mmol/L   Potassium 3.3 (L) 3.5 - 5.1 mmol/L   Chloride 101 98 - 111 mmol/L   CO2 20 (L) 22 - 32 mmol/L   Glucose, Bld 101 (H) 70 - 99 mg/dL    Comment: Glucose reference range applies only to samples taken after fasting for at least 8 hours.   BUN 19 8 - 23 mg/dL   Creatinine, Ser 1.52 (H) 0.61 - 1.24 mg/dL   Calcium 8.8 (L) 8.9 - 10.3 mg/dL   Total Protein  6.1 (L) 6.5 - 8.1 g/dL   Albumin 3.7 3.5 - 5.0 g/dL   AST 31 15 - 41 U/L   ALT 17 0 - 44 U/L   Alkaline Phosphatase  70 38 - 126 U/L   Total Bilirubin 0.7 0.3 - 1.2 mg/dL   GFR calc non Af Amer 43 (L) >60 mL/min   GFR calc Af Amer 49 (L) >60 mL/min   Anion gap 15 5 - 15    Comment: Performed at [facility] [address] [place], [place] [address]  I-STAT, [place] 8     Status: Abnormal   Collection Time: 12/17/19  6:10 PM  Result Value Ref Range   Sodium 134 (L) 135 - 145 mmol/L   Potassium 3.1 (L) 3.5 - 5.1 mmol/L   Chloride 102 98 - 111 mmol/L   BUN 19 8 - 23 mg/dL   Creatinine, Ser 1.40 (H) 0.61 - 1.24 mg/dL   Glucose, Bld 94 70 - 99 mg/dL    Comment: Glucose reference range applies only to samples taken after fasting for at least 8 hours.   Calcium, Ion 1.08 (L) 1.15 - 1.40 mmol/L   TCO2 20 (L) 22 - 32 mmol/L   Hemoglobin 9.5 (L) 13.0 - 17.0 g/dL   HCT 28.0 (L) 39.0 - 52.0 %  Respiratory Panel by RT PCR (Flu A&B, Covid) - Nasopharyngeal Swab     Status: None   Collection Time: 12/17/19  7:04 PM   Specimen: Nasopharyngeal Swab  Result Value Ref Range   SARS Coronavirus 2 by RT PCR NEGATIVE NEGATIVE    Comment: (NOTE) SARS-CoV-2 target nucleic acids are NOT DETECTED. The SARS-CoV-2 RNA is generally detectable in upper respiratoy specimens during the acute phase of infection. The lowest concentration of SARS-CoV-2 viral copies this assay can detect is 131 copies/mL. A negative result does not preclude SARS-Cov-2 infection and should not be used as the sole basis for treatment or other patient management decisions. A negative result may occur with  improper specimen collection/handling, submission of specimen other than nasopharyngeal swab, presence of viral mutation(s) within the areas targeted by this assay, and inadequate number of viral copies (<131 copies/mL). A negative result must be combined with clinical observations, patient history, and epidemiological  information. The expected result is Negative. Fact Sheet for Patients:  PinkCheek.be Fact Sheet for Healthcare Providers:  GravelBags.it This test is not yet ap proved or cleared by the [place] FDA and  has been authorized for detection and/or diagnosis of SARS-CoV-2 by FDA under an Emergency Use Authorization (EUA). This EUA will remain  in effect (meaning this test can be used) for the duration of the COVID-19 declaration under Section 564(b)(1) of the Act, 21 U.S.C. section 360bbb-3(b)(1), unless the authorization is terminated or revoked sooner.    Influenza A by PCR NEGATIVE NEGATIVE   Influenza B by PCR NEGATIVE NEGATIVE    Comment: (NOTE) The Xpert Xpress SARS-CoV-2/FLU/RSV assay is intended as an aid in  the diagnosis of influenza from Nasopharyngeal swab specimens and  should not be used as a sole basis for treatment. Nasal washings and  aspirates are unacceptable for Xpert Xpress SARS-CoV-2/FLU/RSV  testing. Fact Sheet for Patients: PinkCheek.be Fact Sheet for Healthcare Providers: GravelBags.it This test is not yet approved or cleared by the [place] FDA and  has been authorized for detection and/or diagnosis of SARS-CoV-2 by  FDA under an Emergency Use Authorization (EUA). This EUA will remain  in effect (meaning this test can be used) for the duration of the  Covid-19 declaration under Section 564(b)(1) of the Act, 21  U.S.C. section 360bbb-3(b)(1), unless the authorization is  terminated or revoked. Performed at [facility], [address], [place], [place] [address]    Imaging studies were personally reviewed:  CT head: Nondiagnostic study through the skull base and posterior fossa. Otherwise, no acute intracranial hemorrhage or evidence of acute infarction. ASPECT score is 10.  Advanced chronic microvascular ischemic  changes.  CTA head and neck: No large vessel occlusion. No hemodynamically significant stenosis in the neck. 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  Assessment: 79 y.o. male presenting with acute global aphasia.  1. 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  2. CT head with no acute ICH or evidence of acute infarction.  3. CTA head and neck with no LVO. 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  4. Stroke Risk Factors - HTN 5. CML  Plan: 1. After comprehensive review of possible contraindications, he has no absolute contraindications to tPA administration. The patient is a tPA candidate. Discussed extensively the risks/benefits of tPA treatment vs. no treatment with the patient's wife over speakerphone, including risks of hemorrhage and death with tPA administration versus worse overall outcomes on average in patients within the tPA time window who are not administered tPA. The patient's aphasia precludes meaningful medical decision making on his part at this time. Overall benefits of tPA regarding long-term prognosis are felt to outweigh risks. The patient's wife expressed understanding and wish to proceed with tPA. Consent was witnessed by Pharmacist.  2. Following IV tPA, will admit to the Neuro ICU under the Neurology service. Post-tPA order set to include frequent neuro checks and BP management.  3. No antiplatelet medications or anticoagulants for at least 24 hours following tPA.  4. DVT prophylaxis with SCDs.  5. Telemetry monitoring. 6. Classifiable as having failed ASA monotherapy. Will need escalation of antiplatelet therapy to Plavix if follow up CT at 24 hours is negative for hemorrhagic conversion. 7. TTE.  8. MRI brain.  9. Fasting lipid panel,  HgbA1c 10. PT/OT/Speech.  11. NPO until passes swallow evaluation.  12. Continue Gleevec for his CML  60 minutes spent in the emergent neurological evaluation and management of this critically ill acute stroke patient.   @Electronically  signed: Dr. Kerney Elbe  12/17/2019, 11:43 PM

## 2019-12-17 NOTE — ED Provider Notes (Signed)
[facility] EMERGENCY DEPARTMENT Provider Note   CSN: [id number] Arrival date & time: 12/17/19  1803     History Chief Complaint  Patient presents with  . Code Stroke    Alex Christensen is a 79 y.o. male.  Level 5 caveat due to aphasia, hx provided by EMS, caregiver. Patient at 515pm with sudden aphasia and confusion. Hx of CML and TIA. Patient with possible left sided weakness  The history is provided by the patient, the EMS personnel and a caregiver.  Neurologic Problem This is a new problem. The current episode started 1 to 2 hours ago. The problem occurs constantly. The problem has not changed since onset.Nothing aggravates the symptoms. Nothing relieves the symptoms. He has tried nothing for the symptoms. The treatment provided no relief.       Past Medical History:  Diagnosis Date  . High blood pressure   . Leukemia, chronic myeloid [facility])     Patient Active Problem List   Diagnosis Date Noted  . 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[place]) 04/15/2019    Past Surgical History:  Procedure Laterality Date  . TOOTH EXTRACTION         No family history on file.  Social History   Tobacco Use  . Smoking status: Former Research scientist (life sciences)  . Smokeless tobacco: Never Used  Substance Use Topics  . Alcohol use: Yes  . Drug use: Not on file    Home Medications Prior to Admission medications   Medication Sig Start Date End Date Taking? Authorizing Provider  aspirin 325 MG tablet Take 325 mg by mouth daily.   Yes [provider]  atorvastatin (LIPITOR) 10 MG tablet Take 10 mg by mouth daily.  04/09/19 04/08/20 Yes [provider]  ferrous sulfate 324 MG TBEC Take 324 mg by mouth daily.   Yes [provider]  imatinib (GLEEVEC) 100 MG tablet Take 300 mg by mouth daily.    Yes [provider]  traZODone (DESYREL) 50 MG tablet Take 75 mg by mouth at bedtime.  04/09/19 04/08/20 Yes [provider]    Allergies    Patient has no known  allergies.  Review of Systems   Review of Systems  Unable to perform ROS: Acuity of condition    Physical Exam Updated Vital Signs  ED Triage Vitals  Enc Vitals Group     BP 12/17/19 1835 (!) 233/89     Pulse Rate 12/17/19 1835 (!) 101     Resp --      Temp --      Temp src --      SpO2 --      Weight 12/17/19 1800 117 lb 1 oz (53.1 kg)     Height --      Head Circumference --      Peak Flow --      Pain Score --      Pain Loc --      Pain Edu? --      Excl. in [place]? --      Physical Exam Vitals and nursing note reviewed.  Constitutional:      General: He is in acute distress.     Appearance: He is well-developed. He is ill-appearing.  HENT:     Head: Normocephalic and atraumatic.     Nose: Nose normal.     Mouth/Throat:     Mouth: Mucous membranes are moist.  Eyes:     Extraocular Movements: Extraocular movements intact.     Conjunctiva/sclera:  Conjunctivae normal.  Cardiovascular:     Rate and Rhythm: Normal rate and regular rhythm.     Pulses: Normal pulses.     Heart sounds: Normal heart sounds. No murmur.  Pulmonary:     Effort: Pulmonary effort is normal. No respiratory distress.     Breath sounds: Normal breath sounds.  Abdominal:     Palpations: Abdomen is soft.     Tenderness: There is no abdominal tenderness.  Musculoskeletal:        General: No tenderness. Normal range of motion.     Cervical back: Normal range of motion and neck supple.  Skin:    General: Skin is warm and dry.     Capillary Refill: Capillary refill takes less than 2 seconds.  Neurological:     Mental Status: He is confused.     Comments: Dense aphasia, moves all extremities but overall difficult assess due to agitation, does not follow commands well, no obvious weakness or gaze deficit  Psychiatric:        Mood and Affect: Mood normal.     ED Results / Procedures / Treatments   Labs (all labs ordered are listed, but only abnormal results are displayed) Labs Reviewed  CBC  - Abnormal; Notable for the following components:      Result Value   RBC 2.93 (*)    Hemoglobin 10.0 (*)    HCT 28.8 (*)    MCH 34.1 (*)    All other components within normal limits  COMPREHENSIVE METABOLIC PANEL - Abnormal; Notable for the following components:   Potassium 3.3 (*)    CO2 20 (*)    Glucose, Bld 101 (*)    Creatinine, Ser 1.52 (*)    Calcium 8.8 (*)    Total Protein 6.1 (*)    GFR calc non Af Amer 43 (*)    GFR calc Af Amer 49 (*)    All other components within normal limits  POCT I-STAT, CHEM 8 - Abnormal; Notable for the following components:   Sodium 134 (*)    Potassium 3.1 (*)    Creatinine, Ser 1.40 (*)    Calcium, Ion 1.08 (*)    TCO2 20 (*)    Hemoglobin 9.5 (*)    HCT 28.0 (*)    All other components within normal limits  RESPIRATORY PANEL BY RT PCR (FLU A&B, COVID)  ETHANOL  PROTIME-INR  APTT  DIFFERENTIAL  RAPID URINE DRUG SCREEN, HOSP PERFORMED  URINALYSIS, ROUTINE W REFLEX MICROSCOPIC  CBG MONITORING, ED  I-STAT CHEM 8, ED    EKG None  Radiology CT HEAD CODE STROKE WO CONTRAST  Result Date: 12/17/2019 CLINICAL DATA:  Code stroke.  Left-sided weakness EXAM: CT HEAD WITHOUT CONTRAST TECHNIQUE: Contiguous axial images were obtained from the base of the skull through the vertex without intravenous contrast. COMPARISON:  None. FINDINGS: Motion artifact is present despite repeat imaging. Nondiagnostic evaluation at the level of the skull base and posterior fossa. Brain: No acute intracranial hemorrhage, mass effect, or edema. No acute appearing loss of gray-white differentiation. Confluent areas of hypoattenuation in the supratentorial white matter are nonspecific but likely reflect advanced chronic microvascular ischemic changes. 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 Vascular: No hyperdense vessel. Skull: Unremarkable. Sinuses/Orbits: Left frontal, ethmoid, and maxillary sinus opacification. Other: None. ASPECTS  [facility] Stroke Program Early CT Score) - Ganglionic level infarction (caudate, lentiform nuclei, internal capsule, insula, M1-M3 cortex): 7 - Supraganglionic infarction (M4-M6 cortex): 3 Total score (  0-10 with 10 being normal): 10 IMPRESSION: Nondiagnostic study through the skull base and posterior fossa. Otherwise, no acute intracranial hemorrhage or evidence of acute infarction. ASPECT score is 10. Advanced chronic microvascular ischemic changes. These results were communicated to Dr. Cheral Marker at 6:24 pmon 5/6/2021by text page via the [facility] messaging system. Electronically Signed   By: Macy Mis M.D.   On: 12/17/2019 18:27    Procedures .Critical Care Performed by: Lennice Sites, DO Authorized by: Lennice Sites, DO   Critical care provider statement:    Critical care time (minutes):  40   Critical care was necessary to treat or prevent imminent or life-threatening deterioration of the following conditions:  CNS failure or compromise   Critical care was time spent personally by me on the following activities:  Blood draw for specimens, development of treatment plan with patient or surrogate, discussions with primary provider, evaluation of patient's response to treatment, examination of patient, obtaining history from patient or surrogate, ordering and performing treatments and interventions, ordering and review of laboratory studies, ordering and review of radiographic studies, pulse oximetry, re-evaluation of patient's condition and review of old charts   I assumed direction of critical care for this patient from another provider in my specialty: no     (including critical care time)  Medications Ordered in ED Medications  LORazepam (ATIVAN) 2 MG/ML injection (has no administration in time range)  alteplase (ACTIVASE) 1 mg/mL infusion 47.8 mg (47.8 mg Intravenous New Bag/Given 12/17/19 1856)    Followed by  0.9 %  sodium chloride infusion (has no administration in time range)  LORazepam  (ATIVAN) 2 MG/ML injection (2 mg  Given 12/17/19 1821)  iohexol (OMNIPAQUE) 350 MG/ML injection 100 mL (100 mLs Intravenous Contrast Given 12/17/19 1841)    ED Course  I have reviewed the triage vital signs and the nursing notes.  Pertinent labs & imaging results that were available during my care of the patient were reviewed by me and considered in my medical decision making (see chart for details).    MDM Rules/Calculators/A&P                      Alex Christensen is an 79 year old male with history of CML, stroke who presents to the ED as a code stroke.  Patient with dense aphasia about an hour ago.  Has been confused.  Overall difficult to examine due to confusion and dense aphasia and inability to follow commands.  Neurology at the bedside upon arrival.  Patient with blood pressure [id number] systolic.  Given IV labetalol.  Noncontrasted head CT shows no head bleed.  Neurology concern for acute stroke and after discussion with family patient to be given TPA once blood pressure below [id number] systolic.  Patient to be started on Cleviprex infusion for further blood pressure control if needed. Lab work showed no significant anemia, electrolyte abnormality.  Creatinine overall at baseline.  Blood sugar normal.  Patient to be admitted to the neurological ICU for further observation.  This chart was dictated using voice recognition software.  Despite best efforts to proofread,  errors can occur which can change the documentation meaning.    Final Clinical Impression(s) / ED Diagnoses Final diagnoses:  Cerebrovascular accident (CVA), unspecified mechanism [facility])    Rx / [place] Orders ED Discharge Orders    None       Lennice Sites, DO 12/17/19 1858

## 2019-12-17 NOTE — ED Notes (Signed)
Pt is sleeping and not following commands. Pt is moving all extremities, and is moving to R side.

## 2019-12-17 NOTE — ED Triage Notes (Signed)
Pt here from home via [facility] for stroke, pt was sitting with family and had sudden onset of aphasic speech. Per EMS pt had L side weakness, aphasic, unable to follow commands. LKN 1715, VSS, not on blood thinners.

## 2019-12-17 NOTE — ED Notes (Signed)
Upon arrival pt was projectile vomiting, 4mg  zofran given IV at 1805

## 2019-12-18 ENCOUNTER — Inpatient Hospital Stay (HOSPITAL_COMMUNITY): Payer: Medicare Other

## 2019-12-18 DIAGNOSIS — C9210 Chronic myeloid leukemia, BCR/ABL-positive, not having achieved remission: Secondary | ICD-10-CM

## 2019-12-18 DIAGNOSIS — I6602 Occlusion and stenosis of left middle cerebral artery: Secondary | ICD-10-CM

## 2019-12-18 DIAGNOSIS — E7800 Pure hypercholesterolemia, unspecified: Secondary | ICD-10-CM

## 2019-12-18 DIAGNOSIS — G454 Transient global amnesia: Secondary | ICD-10-CM

## 2019-12-18 DIAGNOSIS — I6389 Other cerebral infarction: Secondary | ICD-10-CM | POA: Diagnosis not present

## 2019-12-18 DIAGNOSIS — G459 Transient cerebral ischemic attack, unspecified: Principal | ICD-10-CM

## 2019-12-18 DIAGNOSIS — R4189 Other symptoms and signs involving cognitive functions and awareness: Secondary | ICD-10-CM

## 2019-12-18 DIAGNOSIS — D333 Benign neoplasm of cranial nerves: Secondary | ICD-10-CM

## 2019-12-18 LAB — HEMOGLOBIN A1C
Hgb A1c MFr Bld: 5.3 % (ref 4.8–5.6)
Mean Plasma Glucose: 105.41 mg/dL

## 2019-12-18 LAB — LIPID PANEL
Cholesterol: 104 mg/dL (ref 0–200)
HDL: 49 mg/dL (ref >40)
LDL Cholesterol: 46 mg/dL (ref 0–99)
Total CHOL/HDL Ratio: 2.1 RATIO
Triglycerides: 46 mg/dL (ref <150)
VLDL: 9 mg/dL (ref 0–40)

## 2019-12-18 LAB — ECHOCARDIOGRAM COMPLETE
Height: 65 in
Weight: 1873.03 oz

## 2019-12-18 LAB — MRSA PCR SCREENING: MRSA by PCR: NEGATIVE

## 2019-12-18 MED ORDER — PANTOPRAZOLE SODIUM 40 MG PO TBEC
40.0000 mg | DELAYED_RELEASE_TABLET | Freq: Every day | ORAL | Status: DC
  Administered 2019-12-18 – 2019-12-23 (×6): 40 mg via ORAL
  Filled 2019-12-18 (×6): qty 1

## 2019-12-18 MED ORDER — CHLORHEXIDINE GLUCONATE CLOTH 2 % EX PADS
6.0000 | MEDICATED_PAD | Freq: Every day | CUTANEOUS | Status: DC
  Administered 2019-12-18 – 2019-12-23 (×4): 6 via TOPICAL

## 2019-12-18 MED ORDER — IMATINIB MESYLATE 100 MG PO TABS
300.0000 mg | ORAL_TABLET | Freq: Every day | ORAL | Status: DC
  Administered 2019-12-18 – 2019-12-23 (×6): 300 mg via ORAL
  Filled 2019-12-18 (×6): qty 3

## 2019-12-18 MED ORDER — LORAZEPAM 2 MG/ML IJ SOLN
1.0000 mg | Freq: Once | INTRAMUSCULAR | Status: AC
  Administered 2019-12-18: 1 mg via INTRAVENOUS
  Filled 2019-12-18: qty 1

## 2019-12-18 MED ORDER — CLOPIDOGREL BISULFATE 75 MG PO TABS
75.0000 mg | ORAL_TABLET | Freq: Every day | ORAL | Status: DC
  Administered 2019-12-18 – 2019-12-23 (×6): 75 mg via ORAL
  Filled 2019-12-18 (×6): qty 1

## 2019-12-18 MED ORDER — ATORVASTATIN CALCIUM 10 MG PO TABS
10.0000 mg | ORAL_TABLET | Freq: Every day | ORAL | Status: DC
  Administered 2019-12-18 – 2019-12-22 (×5): 10 mg via ORAL
  Filled 2019-12-18 (×5): qty 1

## 2019-12-18 MED ORDER — POTASSIUM CHLORIDE 10 MEQ/100ML IV SOLN
10.0000 meq | INTRAVENOUS | Status: AC
  Administered 2019-12-18 (×3): 10 meq via INTRAVENOUS
  Filled 2019-12-18 (×3): qty 100

## 2019-12-18 MED ORDER — ASPIRIN EC 325 MG PO TBEC
325.0000 mg | DELAYED_RELEASE_TABLET | Freq: Every day | ORAL | Status: DC
  Administered 2019-12-18 – 2019-12-23 (×6): 325 mg via ORAL
  Filled 2019-12-18 (×6): qty 1

## 2019-12-18 MED ORDER — LABETALOL HCL 5 MG/ML IV SOLN: 5.0000 mg | INTRAVENOUS | Status: DC | PRN

## 2019-12-18 MED ORDER — SODIUM CHLORIDE 0.9 % IV SOLN: INTRAVENOUS | Status: DC | PRN

## 2019-12-18 NOTE — Progress Notes (Signed)
  Speech Language Pathology Treatment: Cognitive-Linquistic(Aphasia )  Patient Details Name: Alex Christensen MRN: [id number] DOB: Mar 28, 1941 Today's Date: 12/18/2019 Time: [id number] SLP Time Calculation (min) (ACUTE ONLY): 14 min  Assessment / Plan / Recommendation Clinical Impression  Pt was seen for aphasia treatment and was cooperative throughout the session. Pt's wife reported that the pt briefly participated in speech therapy last year at [facility] but discontinued therapy due to frustration and annoyance. Pt required intermittent verbal prompts for word retrieval during a picture-description task. Pt and his wife were educated regarding strategies to facilitate word retrieval and reduce pt frustration during episodes of word retrieval as well as general communciation. Pt's wife verbalized understanding as well as agreement. Pt required consistent cues for use of compensatory strategies during episodes of word retrieval. SLP will continue to follow pt.    HPI HPI: Pt is an 79 y.o. male who presented with acute onset of aphasia, agitation and right hemineglect. CT of the head was negative for acute changes.  TPA was administered.      SLP Plan  Continue with current plan of care  Patient needs continued Speech Lanaguage Pathology Services    Recommendations                   Follow up Recommendations: Other (comment)(Continued SLP services at level of care recommended by PT/OT) SLP Visit Diagnosis: Aphasia (R47.01) Plan: Continue with current plan of care       Alex Christensen, [place], [place] Office number [phone] Pager [phone]                Alex Christensen 12/18/2019, 2:25 PM

## 2019-12-18 NOTE — Progress Notes (Signed)
PT Cancellation Note  Patient Details Name: Alex Christensen MRN: [id number] DOB: 11-08-1940   Cancelled Treatment:    Reason Eval/Treat Not Completed: Active bedrest order. Pt on bedrest after tPA administration on 5/5 at 1900. PT will follow up when pt is medically stable and off of bedrest.   Zenaida Niece 12/18/2019, 7:32 AM

## 2019-12-18 NOTE — Progress Notes (Signed)
Echocardiogram 2D Echocardiogram has been performed.  Oneal Deputy Deklin Bieler 12/18/2019, 8:45 AM

## 2019-12-18 NOTE — Social Work (Signed)
CSW was unable to complete sbirt due to pt being on the ventilator. CSW may attempt to complete at more appropriate time.   Tamalyn Wadsworth, LCSWA, LCASA Clinical Social Worker [phone]    

## 2019-12-18 NOTE — Evaluation (Signed)
Physical Therapy Evaluation Patient Details Name: Alex Christensen MRN: [id number] DOB: 12-Sep-1940 Today's Date: 12/18/2019   History of Present Illness  79 yo male presenting to ED with aphasia and confusion. tPA given at [facility] on 12/18/19. CT showing "Nondiagnostic study through the skull base and posterior fossa. Otherwise, no acute intracranial hemorrhage or evidence of acute." Awaiting MRI. PMH including CML and HTN.   Clinical Impression  Pt presents to PT with deficits in cognition, balance, safety awareness, awareness of deficits, functional mobility, gait, and communication. Pt with expressive aphasia and some word finding difficulties present. Pt is easily distracted and impulsive, requiring frequent cueing or assistance to maintain safety during session. Pt requires physical assistance to prevent falls during all OOB activity due due to increased sway and impaired balance. Pt will benefit from aggressive mobilization and PT POC to reduce falls risk and aide in a return to the pt's prior level of function.    Follow Up Recommendations CIR;Supervision/Assistance - 24 hour    Equipment Recommendations  Wheelchair (measurements PT);Wheelchair cushion (measurements PT)(if D/C home today)    Recommendations for Other Services Rehab consult     Precautions / Restrictions Precautions Precautions: Fall Restrictions Weight Bearing Restrictions: No      Mobility  Bed Mobility Overal bed mobility: Needs Assistance Bed Mobility: Supine to Sit     Supine to sit: Min assist        Transfers Overall transfer level: Needs assistance Equipment used: 1 person hand held assist;Rolling walker (2 wheeled) Transfers: Sit to/from Stand Sit to Stand: Min assist         General transfer comment: pt performs 4-5 sit to stands during session, often impulsively from bed. First 4 with hand hold and final with RW  Ambulation/Gait Ambulation/Gait assistance: Mod assist Gait Distance (Feet): 5  Feet Assistive device: Rolling walker (2 wheeled) Gait Pattern/deviations: Step-to pattern;Staggering left;Drifts right/left Gait velocity: reduced Gait velocity interpretation: <1.8 ft/sec, indicate of risk for recurrent falls General Gait Details: pt with short step to gait, significant increased lateral sway with one posterior-left LOB requiring modA to correct. Pt also requires cues for direction throughout short distance of ambulation  Stairs            Wheelchair Mobility    Modified Rankin (Stroke Patients Only) Modified Rankin (Stroke Patients Only) Pre-Morbid Rankin Score: No significant disability Modified Rankin: Moderately severe disability     Balance Overall balance assessment: Needs assistance Sitting-balance support: Single extremity supported;Feet supported Sitting balance-Leahy Scale: Fair Sitting balance - Comments: minG at edge of bed for safety   Standing balance support: Single extremity supported Standing balance-Leahy Scale: Poor Standing balance comment: minA to maintain static standign with UE support of OT                             Pertinent Vitals/Pain Pain Assessment: Faces Pain Score: 0-No pain Faces Pain Scale: No hurt    Home Living Family/patient expects to be discharged to:: Private residence Living Arrangements: Spouse/significant other Available Help at Discharge: Family;Available 24 hours/day Type of Home: House Home Access: Level entry     Home Layout: Two level;Able to live on main level with bedroom/bathroom Home Equipment: Walker - 2 wheels      Prior Function Level of Independence: Needs assistance   Gait / Transfers Assistance Needed: pt ambulates independently with use of RW, tolerance limited by back pain  ADL's / Homemaking Assistance Needed: pt requires assistance  with bathing, trouble transferring into tub        Hand Dominance        Extremity/Trunk Assessment   Upper Extremity  Assessment Upper Extremity Assessment: Defer to OT evaluation    Lower Extremity Assessment Lower Extremity Assessment: Generalized weakness    Cervical / Trunk Assessment Cervical / Trunk Assessment: Normal  Communication   Communication: No difficulties  Cognition Arousal/Alertness: Awake/alert Behavior During Therapy: Impulsive Overall Cognitive Status: Difficult to assess(due to language deficits) Area of Impairment: Orientation;Attention;Memory;Following commands;Safety/judgement;Awareness;Problem solving                 Orientation Level: Disoriented to;Situation(oriented to person, place, and year) Current Attention Level: Focused Memory: Decreased recall of precautions;Decreased short-term memory Following Commands: Follows one step commands inconsistently;Follows one step commands with increased time Safety/Judgement: Decreased awareness of safety;Decreased awareness of deficits Awareness: Intellectual Problem Solving: Slow processing;Decreased initiation;Requires verbal cues;Requires tactile cues General Comments: pt is very impulsive and easily distracted. Regains attention with verbal cues but is quick to forget about task at hand      General Comments General comments (skin integrity, edema, etc.): VSS on RA    Exercises     Assessment/Plan    PT Assessment Patient needs continued PT services  PT Problem List Decreased strength;Decreased activity tolerance;Decreased balance;Decreased mobility;Decreased cognition;Decreased knowledge of use of DME;Decreased safety awareness;Decreased knowledge of precautions       PT Treatment Interventions DME instruction;Gait training;Functional mobility training;Therapeutic activities;Therapeutic exercise;Balance training;Neuromuscular re-education;Cognitive remediation;Patient/family education    PT Goals (Current goals can be found in the Care Plan section)  Acute Rehab PT Goals Patient Stated Goal: To improve  strength and mobility PT Goal Formulation: With patient Time For Goal Achievement: 01/01/20 Potential to Achieve Goals: Good Additional Goals Additional Goal #1: Pt will maintain dynamic standing balance within 10 inches of his base of support with unilateral UE support of the LRAD and with supervision    Frequency Min 4X/week   Barriers to discharge        Co-evaluation               AM-PAC PT "6 Clicks" Mobility  Outcome Measure Help needed turning from your back to your side while in a flat bed without using bedrails?: A Little Help needed moving from lying on your back to sitting on the side of a flat bed without using bedrails?: A Little Help needed moving to and from a bed to a chair (including a wheelchair)?: A Little Help needed standing up from a chair using your arms (e.g., wheelchair or bedside chair)?: A Little Help needed to walk in hospital room?: A Lot Help needed climbing 3-5 steps with a railing? : Total 6 Click Score: 15    End of Session   Activity Tolerance: Patient tolerated treatment well Patient left: in chair;with call bell/phone within reach;with chair alarm set;with family/visitor present Nurse Communication: Mobility status PT Visit Diagnosis: Unsteadiness on feet (R26.81);Other symptoms and signs involving the nervous system (R29.898);Muscle weakness (generalized) (M62.81)    Time: [id number] PT Time Calculation (min) (ACUTE ONLY): 22 min   Charges:   PT Evaluation $PT Eval Moderate Complexity: 1 Mod          Zenaida Niece, PT, DPT Acute Rehabilitation Pager: [phone]   Zenaida Niece 12/18/2019, 2:27 PM

## 2019-12-18 NOTE — Progress Notes (Signed)
STROKE TEAM PROGRESS NOTE   INTERVAL HISTORY RN and daughter are at the bedside. Pt awake alert but not orientated. Moving all extremities. Speech much improved and able to name and repeat.   As per daughter, pt has some baseline cognitive decline but not bad. Yesterday, he seems confused and walk with walker going to wrong direction. Daughter let him sit down but found him mumbling words, not himself. Pt seems to be lethargic and nauseous, not feeling well. EMS called. Pt was agitated in ED with vomiting. CBG 77. Received 4mg  ativan to get CT done. He received tPA. This morning he still drowsy and did not pass swallow screen but now he seems much awake alert and will do swallow screen again. Pending MRI.    Vitals:   12/18/19 0700 12/18/19 0800 12/18/19 0900 12/18/19 1000  BP: 134/73 128/79 128/75 123/77  Pulse: 64 61 69 77  Resp: 12 17 17 17   Temp:  98 F (36.7 C)    TempSrc:  Axillary    SpO2: 100% 100% 100% 95%  Weight:      Height:        CBC:  Recent Labs  Lab 12/17/19 1807 12/17/19 1810  WBC 6.8  --   NEUTROABS 4.2  --   HGB 10.0* 9.5*  HCT 28.8* 28.0*  MCV 98.3  --   PLT 278  --     Basic Metabolic Panel:  Recent Labs  Lab 12/17/19 1807 12/17/19 1810  NA 136 134*  K 3.3* 3.1*  CL 101 102  CO2 20*  --   GLUCOSE 101* 94  BUN 19 19  CREATININE 1.52* 1.40*  CALCIUM 8.8*  --    Lipid Panel:     Component Value Date/Time   CHOL 104 12/18/2019 0535   TRIG 46 12/18/2019 0535   HDL 49 12/18/2019 0535   CHOLHDL 2.1 12/18/2019 0535   VLDL 9 12/18/2019 0535   LDLCALC 46 12/18/2019 0535   HgbA1c:  Lab Results  Component Value Date   HGBA1C 5.3 12/18/2019   Urine Drug Screen: No results found for: LABOPIA, COCAINSCRNUR, LABBENZ, AMPHETMU, THCU, LABBARB  Alcohol Level     Component Value Date/Time   ETH <10 12/17/2019 1807    IMAGING past 24 hours CT Angio Neck W and/or Wo Contrast  Result Date: 12/17/2019 CLINICAL DATA:  Aphasia EXAM: CT ANGIOGRAPHY  HEAD AND NECK TECHNIQUE: Multidetector CT imaging of the head and neck was performed using the standard protocol during bolus administration of intravenous contrast. Multiplanar CT image reconstructions and MIPs were obtained to evaluate the vascular anatomy. Carotid stenosis measurements (when applicable) are obtained utilizing NASCET criteria, using the distal internal carotid diameter as the denominator. CONTRAST:  168mL OMNIPAQUE IOHEXOL 350 MG/ML SOLN COMPARISON:  None. FINDINGS: CTA NECK Aortic arch: Mild calcified and noncalcified plaque along the arch and patent great vessel origins. Right carotid system: Patent. There is calcified and noncalcified plaque along the proximal ICA causing minimal stenosis. Left carotid system: Patent. There is calcified and noncalcified plaque along the common carotid causing mild stenosis. Primarily calcified plaque along the proximal ICA causes minimal stenosis. Vertebral arteries: Patent. Plaque at the origin on the right causing mild stenosis. Codominant. Skeleton: Mild degenerative changes of the cervical spine. Other neck: No mass or adenopathy. Upper chest: No apical lung mass. Review of the MIP images confirms the above findings CTA HEAD Anterior circulation: Intracranial internal carotid arteries are patent with calcified plaque causing mild to moderate stenosis. Anterior and  middle cerebral arteries are patent. Mild to moderate stenosis of the distal right M1 MCA. There is short segment high-grade stenosis of a mid left M2 MCA branch within the sylvian fissure. Posterior circulation: Intracranial vertebral arteries are patent with calcified plaque causing moderate stenosis. Basilar artery is patent. Posterior cerebral arteries are patent. Moderate stenosis of the proximal right P2 PCA. A right posterior communicating artery is present. Venous sinuses: Patent as allowed by contrast bolus timing. Review of the MIP images confirms the above findings IMPRESSION: No large  vessel occlusion. No hemodynamically significant stenosis in the neck. 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 Electronically Signed   By: Macy Mis M.D.   On: 12/17/2019 19:06   CT HEAD CODE STROKE WO CONTRAST  Result Date: 12/17/2019 CLINICAL DATA:  Code stroke.  Left-sided weakness EXAM: CT HEAD WITHOUT CONTRAST TECHNIQUE: Contiguous axial images were obtained from the base of the skull through the vertex without intravenous contrast. COMPARISON:  None. FINDINGS: Motion artifact is present despite repeat imaging. Nondiagnostic evaluation at the level of the skull base and posterior fossa. Brain: No acute intracranial hemorrhage, mass effect, or edema. No acute appearing loss of gray-white differentiation. Confluent areas of hypoattenuation in the supratentorial white matter are nonspecific but likely reflect advanced chronic microvascular ischemic changes. 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 Vascular: No hyperdense vessel. Skull: Unremarkable. Sinuses/Orbits: Left frontal, ethmoid, and maxillary sinus opacification. Other: None. ASPECTS ([place] Stroke Program Early CT Score) - Ganglionic level infarction (caudate, lentiform nuclei, internal capsule, insula, M1-M3 cortex): 7 - Supraganglionic infarction (M4-M6 cortex): 3 Total score (0-10 with 10 being normal): 10 IMPRESSION: Nondiagnostic study through the skull base and posterior fossa. Otherwise, no acute intracranial hemorrhage or evidence of acute infarction. ASPECT score is 10. Advanced chronic microvascular ischemic changes. These results were communicated to Dr. Cheral Marker at 6:24 pmon 5/6/2021by text page via the [facility] messaging system. Electronically Signed   By: Macy Mis M.D.   On: 12/17/2019 18:27   CT ANGIO HEAD CODE STROKE  Result Date: 12/17/2019 CLINICAL DATA:  Aphasia EXAM: CT ANGIOGRAPHY HEAD AND NECK TECHNIQUE:  Multidetector CT imaging of the head and neck was performed using the standard protocol during bolus administration of intravenous contrast. Multiplanar CT image reconstructions and MIPs were obtained to evaluate the vascular anatomy. Carotid stenosis measurements (when applicable) are obtained utilizing NASCET criteria, using the distal internal carotid diameter as the denominator. CONTRAST:  131mL OMNIPAQUE IOHEXOL 350 MG/ML SOLN COMPARISON:  None. FINDINGS: CTA NECK Aortic arch: Mild calcified and noncalcified plaque along the arch and patent great vessel origins. Right carotid system: Patent. There is calcified and noncalcified plaque along the proximal ICA causing minimal stenosis. Left carotid system: Patent. There is calcified and noncalcified plaque along the common carotid causing mild stenosis. Primarily calcified plaque along the proximal ICA causes minimal stenosis. Vertebral arteries: Patent. Plaque at the origin on the right causing mild stenosis. Codominant. Skeleton: Mild degenerative changes of the cervical spine. Other neck: No mass or adenopathy. Upper chest: No apical lung mass. Review of the MIP images confirms the above findings CTA HEAD Anterior circulation: Intracranial internal carotid arteries are patent with calcified plaque causing mild to moderate stenosis. Anterior and middle cerebral arteries are patent. Mild to moderate stenosis of the distal right M1 MCA. There is short segment high-grade stenosis of a mid left M2 MCA branch within the sylvian fissure. Posterior circulation: Intracranial vertebral arteries  are patent with calcified plaque causing moderate stenosis. Basilar artery is patent. Posterior cerebral arteries are patent. Moderate stenosis of the proximal right P2 PCA. A right posterior communicating artery is present. Venous sinuses: Patent as allowed by contrast bolus timing. Review of the MIP images confirms the above findings IMPRESSION: No large vessel occlusion. No  hemodynamically significant stenosis in the neck. 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 Electronically Signed   By: Macy Mis M.D.   On: 12/17/2019 19:06    PHYSICAL EXAM  Temp:  [97.8 F (36.6 C)-98.3 F (36.8 C)] 98 F (36.7 C) (05/07 0800) Pulse Rate:  [37-101] 81 (05/07 1100) Resp:  [9-32] 17 (05/07 1100) BP: (112-233)/(61-130) 148/87 (05/07 1100) SpO2:  [91 %-100 %] 100 % (05/07 1100) Weight:  [53.1 kg] 53.1 kg (05/06 1910)  General - Well nourished, well developed, not in distress.  Ophthalmologic - fundi not visualized due to noncooperation.  Cardiovascular - Regular rate and rhythm.  Neuro - awake alert orientated to age, people and hospital, but not orientated to time. Decreased attention and concentration. Blinking to visual threat bilaterally but inconsistent, tracking bilaterally, perseveration on visual field confrontation testing. able to name and repeat. Facial symmetrical, tongue midline. Gaze bilaterally. Moving all extremities against gravity and symmetric. Sensation subjectively symmetrical. FTN intact bilaterally. Gait not tested.    ASSESSMENT/PLAN Alex Christensen is a 79 y.o. male with history of HTN and CML presenting with aphasia, agitation and R hemineglect with vomiting. Received IV tPA 12/17/2019 at [place].  Possible left brain TIA s/p tPA, could be due to left M2 high-grade stenosis. Seizure is also in DDx.   Code Stroke CT head non-diagnostic skull base/posterior fossa, o/w no acute abnormality. ASPECTS 10.     CTA head & neck no LVO. L M2 high-grade stenosis. Intracranial atherosclerosis.  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  2D Echo EF 60-65%. No source of embolus. LA dilated  EEG within normal limits  LDL 46  HgbA1c 5.3  SCDs for VTE prophylaxis  aspirin 325 mg daily prior to admission, now on ASA 325mg  and plavix 75  DAPT for 3 months and then plavix alone given intracranial stenosis   Therapy recommendations:  CIR  Disposition:  pending   Hypertension  Home meds:  None listed  Stable BP goal < 180/105 . Long-term BP goal 130-150 given intracranial stenosis  Hyperlipidemia  Home meds:  Lipitor 10  LDL 46, goal < 70  Resumed lipitor 10. Will not do high-intensity statin given current LDL less than goal and advanced age.  Continue statin at discharge  Dysphagia, resolved . Secondary to stroke . NPO . 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 Manuella Ghazi in [place].  Hospital day # 1  This patient is critically ill due to s/p tPA due to stroke symptoms, left MCA high grade stenosis, dysphagia, AMS and at significant risk of neurological worsening, death form recurrent stroke, hemorrhagic conversion, seizure, aspiration. This patient's care requires constant monitoring of vital signs, hemodynamics, respiratory and cardiac monitoring, review of multiple databases, neurological assessment, discussion with family, other specialists  and medical decision making of high complexity. I spent 35 minutes of neurocritical care time in the care of this patient. I had long discussion with daughter at bedside, updated pt current condition, treatment plan and potential prognosis, and answered all the questions. She expressed understanding and appreciation.    Rosalin Hawking, MD PhD Stroke Neurology 12/18/2019 9:58 PM   To contact Stroke Continuity provider, please refer to [email or web address] After hours, contact General Neurology

## 2019-12-18 NOTE — Evaluation (Signed)
Speech Language Pathology Evaluation Patient Details Name: Alex Christensen MRN: [id number] DOB: 04/27/1941 Today's Date: 12/18/2019 Time: [id number] SLP Time Calculation (min) (ACUTE ONLY): 25 min  Problem List:  Patient Active Problem List   Diagnosis Date Noted  . 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[place]) 04/15/2019   Past Medical History:  Past Medical History:  Diagnosis Date  . High blood pressure   . Leukemia, chronic myeloid ([place])    Past Surgical History:  Past Surgical History:  Procedure Laterality Date  . TOOTH EXTRACTION     HPI:  Pt is an 79 y.o. male who presented with acute onset of aphasia, agitation and right hemineglect. CT of the head was negative for acute changes.  TPA was administered.   Assessment / Plan / Recommendation Clinical Impression  Pt presents with mild to moderate non-fluent aphasia. He demonstrated impairments in receptive and expressive language with more notable difficulty with verbal expression. Receptively he was able to answer simple yes/no questions and three-step commands with additional processing time. However, he exhibited difficulty with auditory comprehension at the conversational level and with reading comprehension. He responded to open-ended questions with variably accuracy and word retrieval difficulty was noted during sentence formulation with intermittent perseveration. No motor speech deficits were noted during the evaluation and cognition assessment was deferred due to pt's language deficits. Skilled SLP services are clinically indicated at this time to improve aphasia.     SLP Assessment  SLP Recommendation/Assessment: Patient needs continued Speech Lanaguage Pathology Services SLP Visit Diagnosis: Aphasia (R47.01)    Follow Up Recommendations  Other (comment)(Continued SLP services at level of care recommended by PT/OT)    Frequency and Duration min 2x/week  2 weeks      SLP  Evaluation Cognition  Overall Cognitive Status: Difficult to assess(due to language deficits) Arousal/Alertness: Awake/alert Orientation Level: Oriented to person;Oriented to place;Disoriented to time;Disoriented to situation Attention: Focused;Sustained Focused Attention: Impaired Focused Attention Impairment: Verbal complex Sustained Attention: Impaired Sustained Attention Impairment: Verbal complex Awareness: Impaired Awareness Impairment: Intellectual impairment       Comprehension  Auditory Comprehension Overall Auditory Comprehension: Impaired Yes/No Questions: Impaired Basic Biographical Questions: (5/5) Complex Questions: (3/5) Commands: Within Functional Limits Two Step Basic Commands: (3/3) Multistep Basic Commands: (2/2) Conversation: Simple Interfering Components: Attention Reading Comprehension Reading Status: Impaired Word level: Impaired Sentence Level: Impaired    Expression Expression Primary Mode of Expression: Verbal Verbal Expression Overall Verbal Expression: Impaired Initiation: Impaired Automatic Speech: Counting;Day of week;Month of year(WNL) Level of Generative/Spontaneous Verbalization: Sentence Repetition: Impaired(3/5) Level of Impairment: Sentence level Naming: Impairment Responsive: (3/5) Confrontation: (7/8) Divergent: Not tested Verbal Errors: Perseveration Pragmatics: Impairment Impairments: Eye contact   Oral / Motor  Oral Motor/Sensory Function Overall Oral Motor/Sensory Function: Within functional limits Motor Speech Overall Motor Speech: Appears within functional limits for tasks assessed Respiration: Within functional limits Phonation: Normal Resonance: Within functional limits Articulation: Within functional limitis Intelligibility: Intelligible Motor Planning: Witnin functional limits Motor Speech Errors: Not applicable   Raushanah Osmundson I. Hardin Negus, [place], [place] Office number [phone] Pager  [phone]                    Horton Marshall 12/18/2019, 2:06 PM

## 2019-12-18 NOTE — Progress Notes (Signed)
EEG complete - results pending 

## 2019-12-18 NOTE — Evaluation (Signed)
Occupational Therapy Evaluation Patient Details Name: Alex Christensen MRN: [id number] DOB: February 13, 1941 Today's Date: 12/18/2019    History of Present Illness 79 yo male presenting to ED with aphasia and confusion. tPA given at [facility] on 12/18/19. CT showing "Nondiagnostic study through the skull base and posterior fossa. Otherwise, no acute intracranial hemorrhage or evidence of acute." Awaiting MRI. PMH including CML and HTN.    Clinical Impression   PTA, pt was living with his wife and was independent with BADLs; requiring assistance for in/out of shower and use of RW. Pt currently requiring Min A for UB ADLs, Max A for LB ADLs, and Min-Mod A for functional mobility with RW. Pt presenting with decreased awareness, cognition, safety, balance, and strength. Pt's wife very supportive and pt eager to participate in therapy. Pt will require further acute OT to facilitate safe dc. Recommend dc to CIR for intensive OT to optimize safety, independence with ADLs, and return to PLOF.     Follow Up Recommendations  CIR;Supervision/Assistance - 24 hour    Equipment Recommendations  Other (comment)(Defer to next venue)    Recommendations for Other Services PT consult;Speech consult;Rehab consult     Precautions / Restrictions Precautions Precautions: Fall Restrictions Weight Bearing Restrictions: No      Mobility Bed Mobility Overal bed mobility: Needs Assistance Bed Mobility: Supine to Sit     Supine to sit: Min assist     General bed mobility comments: Min A for trunk support  Transfers Overall transfer level: Needs assistance Equipment used: 1 person hand held assist;Rolling walker (2 wheeled) Transfers: Sit to/from Stand Sit to Stand: Min assist         General transfer comment: pt performs 4-5 sit to stands during session, often impulsively from bed. First 4 with hand hold and final with RW    Balance Overall balance assessment: Needs assistance Sitting-balance support: Single  extremity supported;Feet supported Sitting balance-Leahy Scale: Fair Sitting balance - Comments: minG at edge of bed for safety   Standing balance support: Single extremity supported Standing balance-Leahy Scale: Poor Standing balance comment: minA to maintain static standing                           ADL either performed or assessed with clinical judgement   ADL Overall ADL's : Needs assistance/impaired Eating/Feeding: NPO;Sitting   Grooming: Minimal assistance;Sitting;Cueing for sequencing   Upper Body Bathing: Minimal assistance;Sitting   Lower Body Bathing: Maximal assistance;Sit to/from stand   Upper Body Dressing : Minimal assistance;Sitting   Lower Body Dressing: Maximal assistance;Sit to/from stand   Toilet Transfer: RW;Minimal assistance;Ambulation(simulated to reclienr) Armed forces technical officer Details (indicate cue type and reason): Min A to power up and gain balanace; Mod A for mobility         Functional mobility during ADLs: Moderate assistance;Rolling walker General ADL Comments: Pt presenting with impuslivity, poor cognition, and decreased balance.     Vision         Perception     Praxis      Pertinent Vitals/Pain Pain Assessment: Faces Faces Pain Scale: No hurt Pain Intervention(s): Monitored during session     Hand Dominance Right   Extremity/Trunk Assessment Upper Extremity Assessment Upper Extremity Assessment: Generalized weakness   Lower Extremity Assessment Lower Extremity Assessment: Defer to PT evaluation   Cervical / Trunk Assessment Cervical / Trunk Assessment: Normal   Communication Communication Communication: No difficulties   Cognition Arousal/Alertness: Awake/alert Behavior During Therapy: Impulsive Overall Cognitive Status:  Difficult to assess(due to language deficits) Area of Impairment: Orientation;Attention;Memory;Following commands;Safety/judgement;Awareness;Problem solving                 Orientation  Level: Disoriented to;Situation(oriented to person, place, and year) Current Attention Level: Focused Memory: Decreased recall of precautions;Decreased short-term memory Following Commands: Follows one step commands inconsistently;Follows one step commands with increased time Safety/Judgement: Decreased awareness of safety;Decreased awareness of deficits Awareness: Intellectual Problem Solving: Slow processing;Decreased initiation;Requires verbal cues;Requires tactile cues General Comments: pt is very impulsive and easily distracted. Regains attention with verbal cues but is quick to forget about task at hand   General Comments  VSS; [name]) present throughout    Exercises     [place] expects to be discharged to:: Private residence Living Arrangements: Spouse/significant other Available Help at Discharge: Family;Available 24 hours/day Type of Home: House Home Access: Level entry     Home Layout: Two level;Able to live on main level with bedroom/bathroom     Bathroom Shower/Tub: Teacher, early years/pre: Standard     Home Equipment: Environmental consultant - 2 wheels      Lives With: Spouse    Prior Functioning/Environment Level of Independence: Needs assistance  Gait / Transfers Assistance Needed: pt ambulates independently with use of RW, tolerance limited by back pain ADL's / Homemaking Assistance Needed: pt requires assistance with bathing, trouble transferring into tub            OT Problem List: Decreased strength;Decreased range of motion;Decreased activity tolerance;Decreased knowledge of use of DME or AE;Decreased knowledge of precautions;Decreased safety awareness      OT Treatment/Interventions: Self-care/ADL training;Therapeutic exercise;Energy conservation;DME and/or AE instruction;Therapeutic activities;Patient/family education    OT Goals(Current goals can be found in the care plan section) Acute Rehab OT  Goals Patient Stated Goal: Return to PLOF and home - wife OT Goal Formulation: With patient/family Time For Goal Achievement: 01/01/20 Potential to Achieve Goals: Good  OT Frequency: Min 2X/week   Barriers to D/C:            Co-evaluation              AM-PAC OT "6 Clicks" Daily Activity     Outcome Measure Help from another person eating meals?: Total Help from another person taking care of personal grooming?: A Little Help from another person toileting, which includes using toliet, bedpan, or urinal?: A Lot Help from another person bathing (including washing, rinsing, drying)?: A Lot Help from another person to put on and taking off regular upper body clothing?: A Little Help from another person to put on and taking off regular lower body clothing?: A Lot 6 Click Score: 13   End of Session Equipment Utilized During Treatment: Gait belt;Rolling walker Nurse Communication: Mobility status  Activity Tolerance: Patient tolerated treatment well Patient left: in chair;with call bell/phone within reach;with chair alarm set;with family/visitor present  OT Visit Diagnosis: Unsteadiness on feet (R26.81);Other abnormalities of gait and mobility (R26.89);Muscle weakness (generalized) (M62.81);Other symptoms and signs involving cognitive function                Time: 1120-1145 OT Time Calculation (min): 25 min Charges:  OT General Charges $OT Visit: 1 Visit OT Evaluation $OT Eval Moderate Complexity: [place], OTR/L Acute Rehab Pager: [phone] Office: [place] 12/18/2019, 5:02 PM

## 2019-12-18 NOTE — Procedures (Signed)
Patient Name: Alex Christensen  MRN: [id number]  Epilepsy Attending: Lora Havens  Referring Physician/Provider: Dr. Rosalin Hawking Date: 12/18/2019 Duration: 25.36 mins  Patient history: 79 year old male presented with acute global amnesia.  EEG to evaluate for seizures.  Level of alertness: Awake  AEDs during EEG study: None  Technical aspects: This EEG study was done with scalp electrodes positioned according to the 10-20 International system of electrode placement. Electrical activity was acquired at a sampling rate of 500Hz  and reviewed with a high frequency filter of 70Hz  and a low frequency filter of 1Hz . EEG data were recorded continuously and digitally stored.   Description: The posterior dominant rhythm consists of 8Hz  activity of moderate voltage (25-35 uV) seen predominantly in posterior head regions, symmetric and reactive to eye opening and eye closing.         Hyperventilation and photic stimulation were not performed.   IMPRESSION: This study is within normal limits. No seizures or epileptiform discharges were seen throughout the recording.  Alex Christensen Alex Christensen

## 2019-12-19 ENCOUNTER — Encounter (HOSPITAL_COMMUNITY): Payer: Self-pay | Admitting: Neurology

## 2019-12-19 LAB — BASIC METABOLIC PANEL
Anion gap: 11 (ref 5–15)
BUN: 19 mg/dL (ref 8–23)
CO2: 22 mmol/L (ref 22–32)
Calcium: 8.5 mg/dL — ABNORMAL LOW (ref 8.9–10.3)
Chloride: 99 mmol/L (ref 98–111)
Creatinine, Ser: 1.09 mg/dL (ref 0.61–1.24)
GFR calc Af Amer: >60 mL/min (ref >60)
GFR calc non Af Amer: >60 mL/min (ref >60)
Glucose, Bld: 100 mg/dL — ABNORMAL HIGH (ref 70–99)
Potassium: 3.4 mmol/L — ABNORMAL LOW (ref 3.5–5.1)
Sodium: 132 mmol/L — ABNORMAL LOW (ref 135–145)

## 2019-12-19 LAB — CBC
HCT: 28.1 % — ABNORMAL LOW (ref 39.0–52.0)
Hemoglobin: 9.7 g/dL — ABNORMAL LOW (ref 13.0–17.0)
MCH: 34 pg (ref 26.0–34.0)
MCHC: 34.5 g/dL (ref 30.0–36.0)
MCV: 98.6 fL (ref 80.0–100.0)
Platelets: 251 10*3/uL (ref 150–400)
RBC: 2.85 MIL/uL — ABNORMAL LOW (ref 4.22–5.81)
RDW: 14.1 % (ref 11.5–15.5)
WBC: 9.7 10*3/uL (ref 4.0–10.5)
nRBC: 0 % (ref 0.0–0.2)

## 2019-12-19 MED ORDER — POTASSIUM CHLORIDE 10 MEQ/100ML IV SOLN
10.0000 meq | INTRAVENOUS | Status: AC
  Administered 2019-12-19 (×3): 10 meq via INTRAVENOUS
  Filled 2019-12-19 (×3): qty 100

## 2019-12-19 NOTE — Progress Notes (Signed)
STROKE TEAM PROGRESS NOTE   INTERVAL HISTORY No complaints are reported today.    Vitals:   12/18/19 2333 12/19/19 0351 12/19/19 0737 12/19/19 1136  BP: (!) 173/92 (!) 179/76 (!) 169/77 (!) 160/75  Pulse: 78 94 94 97  Resp: 18 16 17 18   Temp: (!) 97.5 F (36.4 C) (!) 97.4 F (36.3 C) 97.9 F (36.6 C) 97.6 F (36.4 C)  TempSrc: Axillary Axillary Oral Oral  SpO2: 100% 100% 100% 100%  Weight: 52.9 kg     Height: 5\' 5"  (1.651 m)       CBC:  Recent Labs  Lab 12/17/19 1807 12/17/19 1807 12/17/19 1810 12/19/19 0237  WBC 6.8  --   --  9.7  NEUTROABS 4.2  --   --   --   HGB 10.0*   < > 9.5* 9.7*  HCT 28.8*   < > 28.0* 28.1*  MCV 98.3  --   --  98.6  PLT 278  --   --  251   < > = values in this interval not displayed.    Basic Metabolic Panel:  Recent Labs  Lab 12/17/19 1807 12/17/19 1807 12/17/19 1810 12/19/19 0237  NA 136   < > 134* 132*  K 3.3*   < > 3.1* 3.4*  CL 101   < > 102 99  CO2 20*  --   --  22  GLUCOSE 101*   < > 94 100*  BUN 19   < > 19 19  CREATININE 1.52*   < > 1.40* 1.09  CALCIUM 8.8*  --   --  8.5*   < > = values in this interval not displayed.   Lipid Panel:     Component Value Date/Time   CHOL 104 12/18/2019 0535   TRIG 46 12/18/2019 0535   HDL 49 12/18/2019 0535   CHOLHDL 2.1 12/18/2019 0535   VLDL 9 12/18/2019 0535   LDLCALC 46 12/18/2019 0535   HgbA1c:  Lab Results  Component Value Date   HGBA1C 5.3 12/18/2019   Urine Drug Screen: No results found for: LABOPIA, COCAINSCRNUR, LABBENZ, AMPHETMU, THCU, LABBARB  Alcohol Level     Component Value Date/Time   ETH <10 12/17/2019 1807    IMAGING past 24 hours MR BRAIN WO CONTRAST  Result Date: 12/18/2019 CLINICAL DATA:  Stroke follow-up EXAM: MRI HEAD WITHOUT CONTRAST TECHNIQUE: Multiplanar, multiecho pulse sequences of the brain and surrounding structures were obtained without intravenous contrast. COMPARISON:  CT and CTA from yesterday FINDINGS: Brain: No acute infarction,  hemorrhage, hydrocephalus, extra-axial collection or intra-axial mass lesion. 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 Dilated perivascular spaces below both basal ganglia. 6 mm mass in the right internal auditory canal. Vascular: Preserved flow voids Skull and upper cervical spine: Normal marrow signal Sinuses/Orbits: Complete opacification of left maxillary, ethmoid, and frontal sinuses with central inspissated appearance. Bilateral mastoid opacification with negative nasopharynx. IMPRESSION: 1. No acute finding. 2. Extensive chronic small vessel ischemia. 3. Generalized brain atrophy. 4. 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 5. 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 6. Bilateral mastoid opacification. Electronically Signed   By: Monte Fantasia M.D.   On: 12/18/2019 18:35   EEG adult  Result Date: 12/18/2019 Lora Havens, MD     12/18/2019  5:31 PM Patient Name: Alex Christensen MRN: [id number] Epilepsy Attending: Lora Havens Referring  Physician/Provider: Dr. Rosalin Hawking Date: 12/18/2019 Duration: 25.36 mins Patient history: 79 year old male presented with acute global amnesia.  EEG to evaluate for seizures. Level of alertness: Awake AEDs during EEG study: None Technical aspects: This EEG study was done with scalp electrodes positioned according to the 10-20 International system of electrode placement. Electrical activity was acquired at a sampling rate of 500Hz  and reviewed with a high frequency filter of 70Hz  and a low frequency filter of 1Hz . EEG data were recorded continuously and digitally stored. Description: The posterior dominant rhythm consists of 8Hz  activity of moderate voltage (25-35 uV) seen predominantly in posterior head regions, symmetric and reactive to eye opening and eye closing.        Hyperventilation and photic  stimulation were not performed. IMPRESSION: This study is within normal limits. No seizures or epileptiform discharges were seen throughout the recording. Priyanka Barbra Sarks    PHYSICAL EXAM   Temp:  [97.4 F (36.3 C)-98.7 F (37.1 C)] 97.6 F (36.4 C) (05/08 1136) Pulse Rate:  [75-102] 97 (05/08 1136) Resp:  [14-28] 18 (05/08 1136) BP: (136-179)/(66-102) 160/75 (05/08 1136) SpO2:  [97 %-100 %] 100 % (05/08 1136) Weight:  [52.9 kg] 52.9 kg (05/07 2333)  General - Well nourished, well developed, not in distress.  Ophthalmologic - fundi not visualized due to noncooperation.  Cardiovascular - Regular rate and rhythm.  Neuro - awake alert orientated to age, people and hospital, but not orientated to time. Decreased attention and concentration.  He does follow commands briskly however.  Blinking to visual threat bilaterally but inconsistent, tracking bilaterally, perseveration on visual field confrontation testing. able to name and repeat. Facial symmetrical, tongue midline. Gaze bilaterally. Moving all extremities against gravity and symmetric. Sensation subjectively symmetrical. FTN intact bilaterally. Gait not tested.    ASSESSMENT/PLAN [name] is a 79 y.o. male with history of HTN and CML presenting with aphasia, agitation and R hemineglect with vomiting. Received IV tPA 12/17/2019 at [place].  Possible left brain TIA s/p tPA, could be due to left M2 high-grade stenosis. Seizure is also in DDx.   Code Stroke CT head non-diagnostic skull base/posterior fossa, o/w no acute abnormality. ASPECTS 10.     CTA head & neck no LVO. L M2 high-grade stenosis. Intracranial atherosclerosis.  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  2D Echo EF 60-65%. No source of embolus. LA dilated  EEG within normal limits  LDL 46  HgbA1c 5.3  SCDs for VTE prophylaxis  aspirin 325 mg daily prior to admission, now on ASA 325mg  and plavix 75 DAPT for 3  months and then plavix alone given intracranial stenosis   Therapy recommendations:  CIR  Disposition:  pending   Hypertension  Home meds:  None listed  Stable (SBP 160's to 170's) BP goal < 180/105 . Long-term BP goal 130-150 given intracranial stenosis  Hyperlipidemia  Home meds:  Lipitor 10  LDL 46, goal < 70  Resumed lipitor 10. Will not do high-intensity statin given current LDL less than goal and advanced age.  Continue statin at discharge  Dysphagia, resolved . Secondary to stroke . NPO . 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 Manuella Ghazi in [place].  Per daughter, pt has some baseline cognitive decline but not bad  Hyponatremia - 136->134->132  Hospital day # 2    To contact Stroke Continuity provider, please refer to [email or web address] After hours, contact General Neurology

## 2019-12-19 NOTE — Progress Notes (Signed)
Inpatient Rehab Admissions:  Inpatient Rehab Consult received.  I met with pt and wife at the bedside for rehabilitation assessment and to discuss goals and expectations of an inpatient rehab admission.  Pt was asleep, so goals and expectations of CIR presented to wife.  Wife acknowledged understanding of goals and expectations.  Pt appears to be a good candidate for CIR.  Will continue to follow pt's medical workup and progress with acute care therapies in order to determine potential admission to CIR.   Signed: Gayland Curry, [place], [place] Admissions Coordinator [phone]

## 2019-12-20 LAB — BASIC METABOLIC PANEL
Anion gap: 7 (ref 5–15)
BUN: 17 mg/dL (ref 8–23)
CO2: 23 mmol/L (ref 22–32)
Calcium: 8.6 mg/dL — ABNORMAL LOW (ref 8.9–10.3)
Chloride: 102 mmol/L (ref 98–111)
Creatinine, Ser: 1.39 mg/dL — ABNORMAL HIGH (ref 0.61–1.24)
GFR calc Af Amer: 55 mL/min — ABNORMAL LOW (ref >60)
GFR calc non Af Amer: 48 mL/min — ABNORMAL LOW (ref >60)
Glucose, Bld: 91 mg/dL (ref 70–99)
Potassium: 3.7 mmol/L (ref 3.5–5.1)
Sodium: 132 mmol/L — ABNORMAL LOW (ref 135–145)

## 2019-12-20 MED ORDER — SODIUM CHLORIDE 0.9 % IV BOLUS
250.0000 mL | Freq: Once | INTRAVENOUS | Status: AC
  Administered 2019-12-20: 250 mL via INTRAVENOUS

## 2019-12-20 NOTE — Plan of Care (Signed)
  Problem: Coping: Goal: Will verbalize positive feelings about self Outcome: Progressing Goal: Will identify appropriate support needs Outcome: Progressing   Problem: Self-Care: Goal: Ability to participate in self-care as condition permits will improve Outcome: Progressing   Problem: Nutrition: Goal: Risk of aspiration will decrease Outcome: Progressing   Problem: Ischemic Stroke/TIA Tissue Perfusion: Goal: Complications of ischemic stroke/TIA will be minimized Outcome: Progressing

## 2019-12-20 NOTE — Progress Notes (Signed)
STROKE TEAM PROGRESS NOTE   INTERVAL HISTORY No complaints are reported today.  The wife and the daughter are at the bedside.  They had multiple questions which were all discussed.    Vitals:   12/19/19 2100 12/20/19 0046 12/20/19 0348 12/20/19 0727  BP: (!) 147/102 (!) 91/51 99/62 (!) 149/76  Pulse: 90 76 74 96  Resp: 18 18 17 16   Temp: 98.3 F (36.8 C) 98 F (36.7 C) 97.8 F (36.6 C) 98.3 F (36.8 C)  TempSrc: Oral Oral Axillary Oral  SpO2: 99% 96% 100% 100%  Weight:      Height:        CBC:  Recent Labs  Lab 12/17/19 1807 12/17/19 1807 12/17/19 1810 12/19/19 0237  WBC 6.8  --   --  9.7  NEUTROABS 4.2  --   --   --   HGB 10.0*   < > 9.5* 9.7*  HCT 28.8*   < > 28.0* 28.1*  MCV 98.3  --   --  98.6  PLT 278  --   --  251   < > = values in this interval not displayed.    Basic Metabolic Panel:  Recent Labs  Lab 12/19/19 0237 12/20/19 0403  NA 132* 132*  K 3.4* 3.7  CL 99 102  CO2 22 23  GLUCOSE 100* 91  BUN 19 17  CREATININE 1.09 1.39*  CALCIUM 8.5* 8.6*   Lipid Panel:     Component Value Date/Time   CHOL 104 12/18/2019 0535   TRIG 46 12/18/2019 0535   HDL 49 12/18/2019 0535   CHOLHDL 2.1 12/18/2019 0535   VLDL 9 12/18/2019 0535   LDLCALC 46 12/18/2019 0535   HgbA1c:  Lab Results  Component Value Date   HGBA1C 5.3 12/18/2019   Urine Drug Screen: No results found for: LABOPIA, COCAINSCRNUR, LABBENZ, AMPHETMU, THCU, LABBARB  Alcohol Level     Component Value Date/Time   ETH <10 12/17/2019 1807    IMAGING past 24 hours No results found.  PHYSICAL EXAM   Temp:  [97.8 F (36.6 C)-98.4 F (36.9 C)] 98.3 F (36.8 C) (05/09 0727) Pulse Rate:  [74-96] 96 (05/09 0727) Resp:  [16-18] 16 (05/09 0727) BP: (91-161)/(51-102) 149/76 (05/09 0727) SpO2:  [96 %-100 %] 100 % (05/09 0727)  General - Well nourished, well developed, not in distress.  Ophthalmologic - fundi not visualized due to noncooperation.  Cardiovascular - Regular rate and  rhythm.  Neuro - awake alert orientated to age, people and hospital, but not orientated to time. Somewhat reduced spontaneous speech.  He does follow commands briskly however.  Blinking to visual threat bilaterally but inconsistent, tracking bilaterally, perseveration on visual field confrontation testing. able to name and repeat. Facial symmetrical, tongue midline. Gaze bilaterally. Moving all extremities against gravity and symmetric. Sensation subjectively symmetrical. FTN intact bilaterally. Gait not tested.    ASSESSMENT/PLAN [name] is a 79 y.o. male with history of HTN and CML presenting with aphasia, agitation and R hemineglect with vomiting. Received IV tPA 12/17/2019 at [place].  Possible left brain TIA s/p tPA, could be due to left M2 high-grade stenosis. Seizure is also in DDx.   Code Stroke CT head non-diagnostic skull base/posterior fossa, o/w no acute abnormality. ASPECTS 10.     CTA head & neck no LVO. L M2 high-grade stenosis. Intracranial atherosclerosis.  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  2D Echo EF 60-65%. No source of embolus. LA  dilated  EEG within normal limits  LDL 46  HgbA1c 5.3  SCDs for VTE prophylaxis  aspirin 325 mg daily prior to admission, now on ASA 325mg  and plavix 75 DAPT for 3 months and then plavix alone given intracranial stenosis   Therapy recommendations:  CIR  Disposition:  pending   Hypertension  Home meds:  None listed  Stable Several low BPs today 91/51 ; 99/62 - currently not on any scheduled anti hypertensive meds BP goal < 180/105 . Long-term BP goal 130-150 given intracranial stenosis  Hyperlipidemia  Home meds:  Lipitor 10  LDL 46, goal < 70  Resumed lipitor 10. Will not do high-intensity statin given current LDL less than goal and advanced age.  Continue statin at discharge  Dysphagia, resolved . Secondary to stroke . NPO . 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 Manuella Ghazi in [place].  Per daughter, pt has some baseline cognitive decline but not bad  Hyponatremia - 136->134->132->132  Will give 250 cc NS fluid bolus over 5 hours for low BPs and increasing creatinine. Left M2 high-grade stenosis.  Hospital day # 3    To contact Stroke Continuity provider, please refer to [email or web address] After hours, contact General Neurology

## 2019-12-21 DIAGNOSIS — I63519 Cerebral infarction due to unspecified occlusion or stenosis of unspecified middle cerebral artery: Secondary | ICD-10-CM

## 2019-12-21 NOTE — Progress Notes (Signed)
Physical Therapy Treatment Patient Details Name: Alex Christensen MRN: [id number] DOB: 12-15-1940 Today's Date: 12/21/2019    History of Present Illness 79 yo male presenting to ED with aphasia and confusion. tPA given at [place] on 12/18/19. CT showing "Nondiagnostic study through the skull base and posterior fossa. Otherwise, no acute intracranial hemorrhage or evidence of acute." MRI 5/7 no acute finding; extensive chronic small vessel ischemia. PMH including rt 6 mm acoustic neuroma, CML and HTN.     PT Comments    Patient less impulsive, however has decreased awareness of his deficits (including incontinent of bowel and bladder x 2 during session). He required up to mod assist of 1 person to recover his balance when turning as his legs/feet scissored. Repeated cues for safe use of RW. Able to demonstrate improvement with balance during session with repeated sit to stands.    Follow Up Recommendations  CIR;Supervision/Assistance - 24 hour     Equipment Recommendations  Other (comment)(TBD if no post-acute therapy)    Recommendations for Other Services Rehab consult     Precautions / Restrictions Precautions Precautions: Fall Restrictions Weight Bearing Restrictions: No    Mobility  Bed Mobility Overal bed mobility: Needs Assistance Bed Mobility: Supine to Sit     Supine to sit: Min guard;HOB elevated     General bed mobility comments: minguard for safety due to recent h/o impulsivity  Transfers Overall transfer level: Needs assistance Equipment used: Rolling walker (2 wheeled) Transfers: Sit to/from Stand Sit to Stand: Min assist         General transfer comment: repeated x 7; always needed vc for proper hand placement with RW; practiced with hands on knees to stand and sit for strengthening and balance  Ambulation/Gait Ambulation/Gait assistance: Mod assist;Min assist Gait Distance (Feet): 60 Feet(x1; 10 x2) Assistive device: Rolling walker (2 wheeled) Gait  Pattern/deviations: Drifts right/left;Step-through pattern;Decreased stride length;Scissoring;Narrow base of support Gait velocity: reduced   General Gait Details: mod assist x 1 with turning and feet scissoring; on next turn did well with cues to keep feet apart and take smaller steps but did not then carryover to next turn   Stairs             Wheelchair Mobility    Modified Rankin (Stroke Patients Only) Modified Rankin (Stroke Patients Only) Pre-Morbid Rankin Score: No significant disability Modified Rankin: Moderately severe disability     Balance Overall balance assessment: Needs assistance Sitting-balance support: Feet supported;No upper extremity supported Sitting balance-Leahy Scale: Fair     Standing balance support: No upper extremity supported;During functional activity Standing balance-Leahy Scale: Poor Standing balance comment: minA to maintain static standing                            Cognition Arousal/Alertness: Awake/alert Behavior During Therapy: WFL for tasks assessed/performed Overall Cognitive Status: No family/caregiver present to determine baseline cognitive functioning Area of Impairment: Orientation;Attention;Memory;Following commands;Safety/judgement;Awareness;Problem solving                 Orientation Level: (time NT, otherwise oriented) Current Attention Level: Selective Memory: Decreased short-term memory Following Commands: Follows one step commands with increased time;Follows one step commands consistently Safety/Judgement: Decreased awareness of safety;Decreased awareness of deficits Awareness: Intellectual Problem Solving: Slow processing;Requires verbal cues;Requires tactile cues General Comments: pt had BM in the bed with no awareness; while walking stated he needed to use the bathroom and fortunately was wearing brief as he already had BM by the  time he walked 10 ft to bathroom (and again unaware he had already  gone)       Exercises      General Comments        Pertinent Vitals/Pain Pain Assessment: No/denies pain Faces Pain Scale: No hurt    Home Living                      Prior Function            PT Goals (current goals can now be found in the care plan section) Acute Rehab PT Goals Patient Stated Goal: Return to PLOF and home - wife Time For Goal Achievement: 01/01/20 Potential to Achieve Goals: Good Progress towards PT goals: Progressing toward goals    Frequency    Min 4X/week      PT Plan Current plan remains appropriate    Co-evaluation              AM-PAC PT "6 Clicks" Mobility   Outcome Measure  Help needed turning from your back to your side while in a flat bed without using bedrails?: A Little Help needed moving from lying on your back to sitting on the side of a flat bed without using bedrails?: A Little Help needed moving to and from a bed to a chair (including a wheelchair)?: A Little Help needed standing up from a chair using your arms (e.g., wheelchair or bedside chair)?: A Little Help needed to walk in hospital room?: A Lot Help needed climbing 3-5 steps with a railing? : Total 6 Click Score: 15    End of Session Equipment Utilized During Treatment: Gait belt Activity Tolerance: Patient tolerated treatment well Patient left: in chair;with call bell/phone within reach;with chair alarm set Nurse Communication: Mobility status;Other (comment)(incontinent B/B) PT Visit Diagnosis: Unsteadiness on feet (R26.81);Other symptoms and signs involving the nervous system (R29.898);Muscle weakness (generalized) (M62.81)     Time: [id number] PT Time Calculation (min) (ACUTE ONLY): 32 min  Charges:  $Gait Training: 8-22 mins $Therapeutic Activity: 8-22 mins                      Arby Barrette, PT Pager [phone]    Rexanne Mano 12/21/2019, 12:01 PM

## 2019-12-21 NOTE — Consult Note (Signed)
Physical Medicine and Rehabilitation Consult Reason for Consult: Aphasia with altered mental status Referring Physician: Dr.Xu   HPI: Alex Christensen is a 79 y.o. right-handed male with history of hypertension, CML, tobacco abuse, right internal auditory canal acoustic neuroma followed by Dr. Manuella Ghazi in [facility].  Per chart review lives with spouse.  Two-level home bed and bath main level with level entry.  Patient ambulated independently with use of rolling walker.  Presented 12/17/2019 with acute onset of aphasia and right hemineglect.  Cranial CT scan showed no acute abnormalities.  Patient did receive TPA.  CT angiogram head and neck with no large vessel occlusion or stenosis.  MRI showed extensive chronic small vessel ischemia 6 mm mass in the right internal auditory canal suspect acoustic neuroma.  Neurology follow-up suspect left brain TIA.  EEG negative for seizure.  Echocardiogram ejection fraction 60% no regional wall motion abnormalities.  Admission chemistries with potassium 3.3, creatinine 1.52, RBC 2.93, hemoglobin 10.  Patient is currently maintained on aspirin and Plavix for CVA prophylaxis.  Tolerating a regular diet.  Therapy evaluations completed with recommendations of physical medicine rehab consult.   Review of Systems  Constitutional: Negative for chills and fever.  HENT: Negative for hearing loss.   Eyes: Negative for blurred vision and double vision.  Respiratory: Negative for shortness of breath.   Cardiovascular: Negative for chest pain, palpitations and leg swelling.  Gastrointestinal: Positive for constipation. Negative for heartburn, nausea and vomiting.  Genitourinary: Negative for dysuria, flank pain and hematuria.  Musculoskeletal: Positive for myalgias.  Skin: Negative for rash.  Neurological: Positive for speech change and weakness.  All other systems reviewed and are negative.  Past Medical History:  Diagnosis Date  . High blood pressure    . 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 TOOTH EXTRACTION     History reviewed. No pertinent family history. Social History:  reports that he has quit smoking. He has never used smokeless tobacco. He reports current alcohol use. No history on file for drug. Allergies: No Known Allergies Medications Prior to Admission  Medication Sig Dispense Refill  . aspirin 325 MG tablet Take 325 mg by mouth daily.    Marland Kitchen atorvastatin (LIPITOR) 10 MG tablet Take 10 mg by mouth daily.     . ferrous sulfate 324 MG TBEC Take 324 mg by mouth daily.    Marland Kitchen imatinib (GLEEVEC) 100 MG tablet Take 300 mg by mouth daily.     . traZODone (DESYREL) 50 MG tablet Take 75 mg by mouth at bedtime.       Home: Home Living Family/patient expects to be discharged to:: Private residence Living Arrangements: Spouse/significant other Available Help at Discharge: Family, Available 24 hours/day Type of Home: House Home Access: Level entry [place]: One level Bathroom Shower/Tub: Multimedia programmer: [place]: Environmental consultant - 2 wheels  Lives With: Spouse  Functional History: Prior Function Level of Independence: Needs assistance Gait / Transfers Assistance Needed: pt ambulates independently with use of RW, tolerance limited by back pain ADL's / Homemaking Assistance Needed: pt requires assistance with bathing, trouble transferring into tub Functional Status:  Mobility: Bed Mobility Overal bed mobility: Needs Assistance Bed Mobility: Supine to Sit Supine to sit: Min assist General bed mobility comments: Min A for trunk support Transfers Overall transfer level: Needs assistance Equipment used: 1 person hand held assist, Rolling walker (2 wheeled) Transfers: Sit to/from Stand Sit to  Stand: Min assist General transfer comment: pt performs 4-5 sit to stands during session, often impulsively from bed. First 4 with hand hold and final with  RW Ambulation/Gait Ambulation/Gait assistance: Mod assist Gait Distance (Feet): 5 Feet Assistive device: Rolling walker (2 wheeled) Gait Pattern/deviations: Step-to pattern, Staggering left, Drifts right/left General Gait Details: pt with short step to gait, significant increased lateral sway with one posterior-left LOB requiring modA to correct. Pt also requires cues for direction throughout short distance of ambulation Gait velocity: reduced Gait velocity interpretation: <1.8 ft/sec, indicate of risk for recurrent falls    ADL: ADL Overall ADL's : Needs assistance/impaired Eating/Feeding: NPO, Sitting Grooming: Minimal assistance, Sitting, Cueing for sequencing Upper Body Bathing: Minimal assistance, Sitting Lower Body Bathing: Maximal assistance, Sit to/from stand Upper Body Dressing : Minimal assistance, Sitting Lower Body Dressing: Maximal assistance, Sit to/from stand Toilet Transfer: RW, Minimal assistance, Ambulation(simulated to reclienr) Armed forces technical officer Details (indicate cue type and reason): Min A to power up and gain balanace; Mod A for mobility Functional mobility during ADLs: Moderate assistance, Rolling walker General ADL Comments: Pt presenting with impuslivity, poor cognition, and decreased balance.  Cognition: Cognition Overall Cognitive Status: Difficult to assess(due to language deficits) Arousal/Alertness: Awake/alert Orientation Level: Oriented to person, Oriented to place, Oriented to situation, Disoriented to time Attention: Focused, Sustained Focused Attention: Impaired Focused Attention Impairment: Verbal complex Sustained Attention: Impaired Sustained Attention Impairment: Verbal complex Awareness: Impaired Awareness Impairment: Intellectual impairment Cognition Arousal/Alertness: Awake/alert Behavior During Therapy: Impulsive Overall Cognitive Status: Difficult to assess(due to language deficits) Area of Impairment: Orientation, Attention, Memory,  Following commands, Safety/judgement, Awareness, Problem solving Orientation Level: Disoriented to, Situation(oriented to person, place, and year) Current Attention Level: Focused Memory: Decreased recall of precautions, Decreased short-term memory Following Commands: Follows one step commands inconsistently, Follows one step commands with increased time Safety/Judgement: Decreased awareness of safety, Decreased awareness of deficits Awareness: Intellectual Problem Solving: Slow processing, Decreased initiation, Requires verbal cues, Requires tactile cues General Comments: pt is very impulsive and easily distracted. Regains attention with verbal cues but is quick to forget about task at hand  Blood pressure 118/76, pulse 84, temperature 98.3 F (36.8 C), temperature source Oral, resp. rate 18, height 5\' 5"  (1.651 m), weight 52.9 kg, SpO2 100 %.  Physical Exam  General: Alert. No apparent distress HEENT: Head is normocephalic, atraumatic, PERRLA, EOMI, sclera anicteric, oral mucosa pink and moist, dentition intact, ext ear canals clear,  Neck: Supple without JVD or lymphadenopathy Heart: Reg rate and rhythm. No murmurs rubs or gallops Chest: CTA bilaterally without wheezes, rales, or rhonchi; no distress Abdomen: Soft, non-tender, non-distended, bowel sounds positive. Extremities: No clubbing, cyanosis, or edema. Pulses are 2+ Skin: Clean and intact without signs of breakdown Neuro/MSK: Patient is alert and oriented x2 (not to date). Follows simple commands.  He is able to provide simple appropriate responses yes no.  Provides his name.  Makes good eye contact with examiner. 4+/5 strength throughout without focal deficits.  Psych: Pt's affect is appropriate. Pt is cooperative   No results found for this or any previous visit (from the past 24 hour(s)). No results found.   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 seizure 1. Does the need  for close, 24 hr/day medical supervision in concert with the patient's rehab needs make it unreasonable for this patient to be served in a less intensive setting? Yes 2. 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 Due to bladder management, bowel management, safety, skin/wound care, disease management, medication administration, pain management and patient education, does the patient require 24 hr/day rehab nursing? Yes 4. Does the patient require coordinated care of a physician, rehab nurse, therapy disciplines of PT, OT, SLP to address physical and functional deficits in the context of the above medical diagnosis(es)? Yes Addressing deficits in the following areas: balance, endurance, locomotion, strength, transferring, bowel/bladder control, bathing, dressing, feeding, grooming, toileting, cognition and psychosocial support 5. Can the patient actively participate in an intensive therapy program of at least 3 hrs of therapy per day at least 5 days per week? Yes 6. The potential for patient to make measurable gains while on inpatient rehab is excellent 7. Anticipated functional outcomes upon discharge from inpatient rehab are supervision  with PT, min assist with OT, supervision with SLP. 8. Estimated rehab length of stay to reach the above functional goals is: 14-16 days 9. Anticipated discharge destination: Home 10. Overall Rehab/Functional Prognosis: excellent  RECOMMENDATIONS: This patient's condition is appropriate for continued rehabilitative care in the following setting: CIR Patient has agreed to participate in recommended program. Potentially Note that insurance prior authorization may be required for reimbursement for recommended care.  Comment: Alex Christensen would be an excellent CIR candidate once medically  stable--currently receiving IVF for rising Creatinine.  He lives with his wife and has daughters who live in [place] will be main source of support. Thank you for this consult. We will continue to follow in Alex Christensen care.  Lavon Paganini Angiulli, PA-C 12/21/2019   I have personally performed a face to face diagnostic evaluation, including, but not limited to relevant history and physical exam findings, of this patient and developed relevant assessment and plan.  Additionally, I have reviewed and concur with the physician assistant's documentation above.  Leeroy Cha, MD

## 2019-12-21 NOTE — Progress Notes (Signed)
STROKE TEAM PROGRESS NOTE   INTERVAL HISTORY Patient is doing well.  Neurologically stable.  No changes.  Vital signs are stable.  Yet awaiting rehab bed  Vitals:   12/20/19 2315 12/21/19 0420 12/21/19 0758 12/21/19 1300  BP: 125/74 118/76 (!) 148/85 126/90  Pulse: 79 84 88 88  Resp: 18 18 18 18   Temp: 98.1 F (36.7 C) 98.3 F (36.8 C) 98.1 F (36.7 C) 98.4 F (36.9 C)  TempSrc: Oral Oral Oral Oral  SpO2: 98% 100% 100% 100%  Weight:      Height:        CBC:  Recent Labs  Lab 12/17/19 1807 12/17/19 1807 12/17/19 1810 12/19/19 0237  WBC 6.8  --   --  9.7  NEUTROABS 4.2  --   --   --   HGB 10.0*   < > 9.5* 9.7*  HCT 28.8*   < > 28.0* 28.1*  MCV 98.3  --   --  98.6  PLT 278  --   --  251   < > = values in this interval not displayed.    Basic Metabolic Panel:  Recent Labs  Lab 12/19/19 0237 12/20/19 0403  NA 132* 132*  K 3.4* 3.7  CL 99 102  CO2 22 23  GLUCOSE 100* 91  BUN 19 17  CREATININE 1.09 1.39*  CALCIUM 8.5* 8.6*   Lipid Panel:     Component Value Date/Time   CHOL 104 12/18/2019 0535   TRIG 46 12/18/2019 0535   HDL 49 12/18/2019 0535   CHOLHDL 2.1 12/18/2019 0535   VLDL 9 12/18/2019 0535   LDLCALC 46 12/18/2019 0535   HgbA1c:  Lab Results  Component Value Date   HGBA1C 5.3 12/18/2019   Urine Drug Screen: No results found for: LABOPIA, COCAINSCRNUR, LABBENZ, AMPHETMU, THCU, LABBARB  Alcohol Level     Component Value Date/Time   ETH <10 12/17/2019 1807    IMAGING past 24 hours No results found.  PHYSICAL EXAM  General - Well nourished, well developed, elderly male not in distress.  Ophthalmologic - fundi not visualized due to noncooperation.  Cardiovascular - Regular rate and rhythm.  Neuro - awake alert oriented x2.  Diminished attention, registration and recall.  Nonfluent hesitant speech he does follow commands briskly however.  Blinking to visual threat bilaterally but inconsistent, tracking bilaterally, perseveration on visual  field confrontation testing. able to name and repeat. Facial symmetrical, tongue midline. Gaze bilaterally. Moving all extremities against gravity and symmetric. Sensation subjectively symmetrical. FTN intact bilaterally. Gait not tested.    ASSESSMENT/PLAN [name] is a 79 y.o. male with history of HTN and CML presenting with aphasia, agitation and R hemineglect with vomiting. Received IV tPA 12/17/2019 at [place].  Possible left brain TIA s/p tPA, could be due to left M2 high-grade stenosis. Seizure is also in DDx.   Code Stroke CT head non-diagnostic skull base/posterior fossa, o/w no acute abnormality. ASPECTS 10.     CTA head & neck no LVO. L M2 high-grade stenosis. Intracranial atherosclerosis.  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  2D Echo EF 60-65%. No source of embolus. LA dilated  EEG within normal limits  LDL 46  HgbA1c 5.3  SCDs for VTE prophylaxis  aspirin 325 mg daily prior to admission, now on ASA 325mg  and plavix 75 DAPT for 3 months and then plavix alone given intracranial stenosis   Therapy recommendations:  CIR  Disposition:  pending  Medically ready for d/c to CIR once bed available  Hypertension  Home meds:  None listed  Stable   Low BO 5/9 treated w/ 250cc bolus NS over 5 h BP goal < 180/105 . Long-term BP goal 130-150 given intracranial stenosis  Hyperlipidemia  Home meds:  Lipitor 10  LDL 46, goal < 70  Resumed lipitor 10. Will not do high-intensity statin given current LDL less than goal and advanced age.  Continue statin at discharge  Dysphagia, resolved . Secondary to stroke . 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 Manuella Ghazi  in [place].  Per daughter, pt has some baseline cognitive decline but not bad  Hyponatremia Na 132  Hospital day # 4   Continue ongoing therapies.  Medically stable to be transferred to inpatient rehab when bed available.  No family at the bedside. Antony Contras, MD  To contact Stroke Continuity provider, please refer to [email or web address] After hours, contact General Neurology

## 2019-12-21 NOTE — TOC Initial Note (Signed)
Transition of Care [facility]) - Initial/Assessment Note    Patient Details  Name: Alex Christensen MRN: [id number] Date of Birth: December 11, 1940  [facility] CM/SW Contact:    Pollie Friar, RN Phone Number: 12/21/2019, 2:08 PM  Clinical Narrative:                 CM met with the patient and then called his spouse with his consent. CM inquired about another inpatient rehab that may be able to admit the patient sooner. Wife states that the others are too far for her. She prefers for him to stay here. CIR updated.  TOC following.  Expected Discharge Plan: IP Rehab Facility Barriers to Discharge: Continued Medical Work up   Patient Goals and CMS Choice   CMS Medicare.gov Compare Post Acute Care list provided to:: Patient Represenative (must comment) Choice offered to / list presented to : Spouse  Expected Discharge Plan and Services Expected Discharge Plan: [place]   Discharge Planning Services: CM Consult   Living arrangements for the past 2 months: Single Family Home                                      Prior Living Arrangements/Services Living arrangements for the past 2 months: Single Family Home Lives with:: Spouse Patient language and need for interpreter reviewed:: Yes Do you feel safe going back to the place where you live?: Yes      Need for Family Participation in Patient Care: Yes (Comment) Care giver support system in place?: Yes (comment)   Criminal Activity/Legal Involvement Pertinent to Current Situation/Hospitalization: No - Comment as needed  Activities of Daily Living Home Assistive Devices/Equipment: Walker (specify type) ADL Screening (condition at time of admission) Patient's cognitive ability adequate to safely complete daily activities?: No Is the patient deaf or have difficulty hearing?: No Does the patient have difficulty seeing, even when wearing glasses/contacts?: No Does the patient have difficulty concentrating,  remembering, or making decisions?: Yes Patient able to express need for assistance with ADLs?: No Does the patient have difficulty dressing or bathing?: Yes Independently performs ADLs?: No Communication: Needs assistance Is this a change from baseline?: Change from baseline, expected to last >3 days Dressing (OT): Needs assistance Is this a change from baseline?: Change from baseline, expected to last >3 days Grooming: Needs assistance Is this a change from baseline?: Change from baseline, expected to last >3 days Feeding: Needs assistance Is this a change from baseline?: Change from baseline, expected to last >3 days Bathing: Needs assistance Is this a change from baseline?: Change from baseline, expected to last >3 days Toileting: Needs assistance Is this a change from baseline?: Change from baseline, expected to last >3days In/Out Bed: Needs assistance Is this a change from baseline?: Change from baseline, expected to last >3 days Walks in Home: Needs assistance Is this a change from baseline?: Change from baseline, expected to last >3 days Does the patient have difficulty walking or climbing stairs?: Yes Weakness of Legs: None Weakness of Arms/Hands: None  Permission Sought/Granted                  Emotional Assessment Appearance:: Appears stated age   Affect (typically observed): Accepting     Psych Involvement: No (comment)  Admission diagnosis:  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[place]) 04/15/2019   PCP:  [place] Pink, MD Pharmacy:   [id number] [place]Phone: [phone]: [phone]    Social Determinants of Health (SDOH) Interventions    Readmission Risk Interventions No flowsheet  data found.

## 2019-12-21 NOTE — Progress Notes (Signed)
Occupational Therapy Treatment Patient Details Name: Alex Christensen MRN: [id number] DOB: [birth year] Today's Date: 12/21/2019    History of present illness 79 yo male presenting to ED with aphasia and confusion. tPA given at [place] on 12/18/19. CT showing "Nondiagnostic study through the skull base and posterior fossa. Otherwise, no acute intracranial hemorrhage or evidence of acute." MRI 5/7 no acute finding; extensive chronic small vessel ischemia. PMH including rt 6 mm acoustic neuroma, CML and HTN.    OT comments  Pt requires mod A for functional transfers and LB ADLs as well as grooming standing at sink.  He frequently looses his balance posteriorly requiring assist to correct.  He was not oriented to day and month and requires cues for safety awareness. Recommend CIR level rehab.   Follow Up Recommendations  CIR;Supervision/Assistance - 24 hour    Equipment Recommendations  None recommended by OT    Recommendations for Other Services      Precautions / Restrictions Precautions Precautions: Fall Restrictions Weight Bearing Restrictions: No       Mobility Bed Mobility Overal bed mobility: Needs Assistance Bed Mobility: Supine to Sit     Supine to sit: Min guard;HOB elevated     General bed mobility comments: up in chair   Transfers Overall transfer level: Needs assistance Equipment used: Rolling walker (2 wheeled) Transfers: Stand Pivot Transfers;Sit to/from Stand Sit to Stand: Min assist Stand pivot transfers: Mod assist       General transfer comment: assist for balance when turning     Balance Overall balance assessment: Needs assistance Sitting-balance support: Feet supported;No upper extremity supported Sitting balance-Leahy Scale: Fair     Standing balance support: No upper extremity supported;During functional activity Standing balance-Leahy Scale: Poor Standing balance comment: mod A to miantain dynamic standing while brushing teeth.  he repeatedly  looses balance posteriorly                            ADL either performed or assessed with clinical judgement   ADL Overall ADL's : Needs assistance/impaired     Grooming: Oral care;Wash/dry hands;Moderate assistance;Standing Grooming Details (indicate cue type and reason): assist for balance in standing due to posterior LOB              Lower Body Dressing: Moderate assistance;Sit to/from stand Lower Body Dressing Details (indicate cue type and reason): able to don/doff socks, but requires mod A for standing balance  Toilet Transfer: Moderate assistance;Ambulation;Comfort height toilet;Grab bars;RW Armed forces technical officer Details (indicate cue type and reason): assist for balance          Functional mobility during ADLs: Moderate assistance;Rolling walker       Vision       Perception     Praxis      Cognition Arousal/Alertness: Awake/alert Behavior During Therapy: WFL for tasks assessed/performed Overall Cognitive Status: No family/caregiver present to determine baseline cognitive functioning Area of Impairment: Orientation;Attention;Memory;Following commands;Safety/judgement;Awareness;Problem solving                 Orientation Level: Person;Place Current Attention Level: Selective Memory: Decreased short-term memory Following Commands: Follows one step commands consistently Safety/Judgement: Decreased awareness of safety;Decreased awareness of deficits Awareness: Intellectual Problem Solving: Slow processing;Difficulty sequencing;Requires verbal cues General Comments: Pt not oriented to month, or day, but is oriented to year         Exercises Exercises: Other exercises Other Exercises Other Exercises: worked on standing balance and reaching in standing  Shoulder Instructions       General Comments      Pertinent Vitals/ Pain       Pain Assessment: No/denies pain Faces Pain Scale: No hurt  Home Living                                           Prior Functioning/Environment              Frequency  Min 2X/week        Progress Toward Goals  OT Goals(current goals can now be found in the care plan section)  Progress towards OT goals: Progressing toward goals  Acute Rehab OT Goals Patient Stated Goal: Return to PLOF and home - wife  Plan Discharge plan remains appropriate    Co-evaluation                 AM-PAC OT "6 Clicks" Daily Activity     Outcome Measure   Help from another person eating meals?: A Lot Help from another person taking care of personal grooming?: A Lot Help from another person toileting, which includes using toliet, bedpan, or urinal?: A Lot Help from another person bathing (including washing, rinsing, drying)?: A Lot Help from another person to put on and taking off regular upper body clothing?: A Little Help from another person to put on and taking off regular lower body clothing?: A Lot 6 Click Score: 13    End of Session Equipment Utilized During Treatment: Gait belt;Rolling walker  OT Visit Diagnosis: Unsteadiness on feet (R26.81);Other abnormalities of gait and mobility (R26.89);Muscle weakness (generalized) (M62.81);Other symptoms and signs involving cognitive function   Activity Tolerance Patient tolerated treatment well   Patient Left in chair;with call bell/phone within reach;with bed alarm set   Nurse Communication Mobility status        Time: [id number] OT Time Calculation (min): 13 min  Charges: OT General Charges $OT Visit: 1 Visit OT Treatments $Self Care/Home Management : 8-22 mins  Nilsa Nutting OTR/L Acute Rehabilitation Services Pager [phone] Office [phone]    Lucille Passy M 12/21/2019, 3:25 PM

## 2019-12-22 NOTE — Progress Notes (Signed)
Inpatient Rehabilitation Admissions Coordinator  I met with patient's wife at bedside to explain that inpt rehab /CIR bed not likely available for him this week. I recommended other rehab venues to be explored. I discussed with Dr. Leonie Man as well as TOC team, Vida Roller.  Danne Baxter, RN, MSN Rehab Admissions Coordinator [phone] 12/22/2019 11:21 AM

## 2019-12-22 NOTE — Progress Notes (Signed)
STROKE TEAM PROGRESS NOTE   INTERVAL HISTORY Patient is sitting up in a chair.  Is awake alert and interactive.  Is walking with physical therapist and is slight gait apraxia with right leg.  Family prefers inpatient rehab but the facility at [facility] is backlogged and will not have bed for several days.  Wife is open to considering inpatient rehab in [facility] if bed is available.  Vitals:   12/21/19 1943 12/21/19 2321 12/22/19 0300 12/22/19 0748  BP: (!) 156/90 (!) 159/84 (!) 149/73 138/78  Pulse: 86 84 86 91  Resp: 20 18 18 18   Temp: 98.5 F (36.9 C) 98 F (36.7 C) 97.9 F (36.6 C) 98 F (36.7 C)  TempSrc: Oral Oral Oral Oral  SpO2: 100% 99% 100% 100%  Weight:      Height:        CBC:  Recent Labs  Lab 12/17/19 1807 12/17/19 1807 12/17/19 1810 12/19/19 0237  WBC 6.8  --   --  9.7  NEUTROABS 4.2  --   --   --   HGB 10.0*   < > 9.5* 9.7*  HCT 28.8*   < > 28.0* 28.1*  MCV 98.3  --   --  98.6  PLT 278  --   --  251   < > = values in this interval not displayed.    Basic Metabolic Panel:  Recent Labs  Lab 12/19/19 0237 12/20/19 0403  NA 132* 132*  K 3.4* 3.7  CL 99 102  CO2 22 23  GLUCOSE 100* 91  BUN 19 17  CREATININE 1.09 1.39*  CALCIUM 8.5* 8.6*   Lipid Panel:     Component Value Date/Time   CHOL 104 12/18/2019 0535   TRIG 46 12/18/2019 0535   HDL 49 12/18/2019 0535   CHOLHDL 2.1 12/18/2019 0535   VLDL 9 12/18/2019 0535   LDLCALC 46 12/18/2019 0535   HgbA1c:  Lab Results  Component Value Date   HGBA1C 5.3 12/18/2019   Alcohol Level     Component Value Date/Time   ETH <10 12/17/2019 1807    IMAGING past 24 hours No results found.  PHYSICAL EXAM    General - Well nourished, well developed, elderly male not in distress.  Ophthalmologic - fundi not visualized due to noncooperation.  Cardiovascular - Regular rate and rhythm.  Neuro - awake alert oriented x2.  Diminished attention, registration and recall.  Nonfluent hesitant  speech he does follow commands briskly however.  Blinking to visual threat bilaterally but inconsistent, tracking bilaterally, perseveration on visual field confrontation testing. able to name and repeat. Facial symmetrical, tongue midline. Gaze bilaterally. Moving all extremities against gravity and symmetric. Sensation subjectively symmetrical. FTN intact bilaterally. Gait mild apraxia with right leg while walking with a walker and physical therapist.    ASSESSMENT/PLAN [name] is a 79 y.o. male with history of HTN and CML presenting with aphasia, agitation and R hemineglect with vomiting. Received IV tPA 12/17/2019 at [place].  Possible left brain TIA s/p tPA, could be due to left M2 high-grade stenosis.  Worsening of baseline gait difficulties  Code Stroke CT head non-diagnostic skull base/posterior fossa, o/w no acute abnormality. ASPECTS 10.     CTA head & neck no LVO. L M2 high-grade stenosis. Intracranial atherosclerosis.  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  2D Echo EF 60-65%. No source of embolus. LA dilated  EEG within normal limits  LDL 46  HgbA1c 5.3  SCDs for VTE prophylaxis  aspirin 325 mg daily prior to admission, now on ASA 325mg  and plavix 75 DAPT for 3 months and then plavix alone given intracranial stenosis   Therapy recommendations:  CIR  Disposition:  pending   Medically ready for d/c to CIR once bed available  Hypertension  Home meds:  None listed  Stable   Low BP treated w/ 250cc bolus NS over 5 h on  5/9 BP goal < 180/105 . Long-term BP goal 130-150 given intracranial stenosis  Hyperlipidemia  Home meds:  Lipitor 10  LDL 46, goal < 70  Resumed lipitor 10. Will not do high-intensity statin given current LDL less than goal and advanced age.  Continue statin at discharge  Dysphagia, resolved . Secondary to stroke . 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 Manuella Ghazi in [place].  Per daughter, pt has some baseline cognitive decline but not bad  Hyponatremia Na 132  Hospital day # 5   Patient is improving slowly but still not at his baseline and not safe to go home. Continue ongoing therapies.  Medically stable to be transferred to inpatient rehab when bed available.  Discussed with wife at bedside and answered questions.  She is open to looking at inpatient rehab facilities in [facility] in [place].  Discussed with rehab coordinator and social worker.  Greater than 50% time during this 25-minute visit was spent in counseling and coordination of care and answering questions Antony Contras, MD  To contact Stroke Continuity provider, please refer to [email or web address] After hours, contact General Neurology

## 2019-12-22 NOTE — Progress Notes (Signed)
Physical Therapy Treatment Patient Details Name: Alex Christensen MRN: [id number] DOB: [birth year] Today's Date: 12/22/2019    History of Present Illness 79 yo male presenting to ED with aphasia and confusion. tPA given at [place] on 12/18/19. CT showing "Nondiagnostic study through the skull base and posterior fossa. Otherwise, no acute intracranial hemorrhage or evidence of acute." MRI 5/7 no acute finding; extensive chronic small vessel ischemia. PMH including rt 6 mm acoustic neuroma, CML and HTN.     PT Comments    Patient's wife present during session. Reports his walking is not back to baseline (decr control/placement of RLE, including dragging behind/inattention at times vs scissoring). Gait improved with his shoes vs hospital slippers and vc to slow down and walk with feet apart. Focused on repeated transfers for improving safety with sit to stand and RW.    Follow Up Recommendations  CIR;Supervision/Assistance - 24 hour     Equipment Recommendations  Other (comment)(TBD if no post-acute therapy)    Recommendations for Other Services       Precautions / Restrictions Precautions Precautions: Fall Restrictions Weight Bearing Restrictions: No    Mobility  Bed Mobility                  Transfers Overall transfer level: Needs assistance Equipment used: Rolling walker (2 wheeled) Transfers: Sit to/from Stand Sit to Stand: Min assist;Min guard         General transfer comment: vc 90% of transfers for safe hand placement and sequencing with RW; ~10 reps during session (5 in a row for repetition)  Ambulation/Gait Ambulation/Gait assistance: Min assist Gait Distance (Feet): 180 Feet Assistive device: Rolling walker (2 wheeled)(pt's shoes) Gait Pattern/deviations: Drifts right/left;Step-through pattern;Narrow base of support;Decreased step length - right;Decreased dorsiflexion - right;Scissoring Gait velocity: initially too fast with cues to slow down adhered to     General Gait Details: initial 15 ft pt with quick steps with RLE initially dragging behind and then crossing over LLE; with cues to slow down and keep feet further apart, pt walked remaining 165 ft without scissoring   Stairs             Wheelchair Mobility    Modified Rankin (Stroke Patients Only) Modified Rankin (Stroke Patients Only) Pre-Morbid Rankin Score: No significant disability Modified Rankin: Moderately severe disability     Balance Overall balance assessment: Needs assistance Sitting-balance support: Feet supported;No upper extremity supported Sitting balance-Leahy Scale: Good Sitting balance - Comments: at EOC reaching down to don shoes   Standing balance support: No upper extremity supported;During functional activity Standing balance-Leahy Scale: Poor                              Cognition Arousal/Alertness: Awake/alert Behavior During Therapy: WFL for tasks assessed/performed Overall Cognitive Status: Impaired/Different from baseline Area of Impairment: Attention;Memory;Following commands;Safety/judgement;Awareness;Problem solving                 Orientation Level: (not tested) Current Attention Level: Selective Memory: Decreased short-term memory Following Commands: Follows one step commands consistently Safety/Judgement: Decreased awareness of safety;Decreased awareness of deficits Awareness: Intellectual Problem Solving: Slow processing;Difficulty sequencing;Requires verbal cues General Comments: pt was not having incontinence of bowels PTA per wife; today he was able to state he needed to have a BM, however he had started going (in undergarment) before he made it to the toilet 10 ft away      Exercises Other Exercises Other Exercises: standing: bil UE  support marching, heelraises/toe raises  Other Exercises: seated: sit to stand with hands on knees and slow descent x 5    General Comments General comments (skin integrity,  edema, etc.): Wife present and reports his decr control of RLE is new.      Pertinent Vitals/Pain Pain Assessment: No/denies pain Faces Pain Scale: No hurt    Home Living                      Prior Function            PT Goals (current goals can now be found in the care plan section) Acute Rehab PT Goals Patient Stated Goal: Return to PLOF and home - wife Time For Goal Achievement: 01/01/20 Potential to Achieve Goals: Good Progress towards PT goals: Progressing toward goals    Frequency    Min 4X/week      PT Plan Current plan remains appropriate    Co-evaluation              AM-PAC PT "6 Clicks" Mobility   Outcome Measure  Help needed turning from your back to your side while in a flat bed without using bedrails?: A Little Help needed moving from lying on your back to sitting on the side of a flat bed without using bedrails?: A Little Help needed moving to and from a bed to a chair (including a wheelchair)?: A Little Help needed standing up from a chair using your arms (e.g., wheelchair or bedside chair)?: A Little Help needed to walk in hospital room?: A Little Help needed climbing 3-5 steps with a railing? : Total 6 Click Score: 16    End of Session Equipment Utilized During Treatment: Gait belt Activity Tolerance: Patient tolerated treatment well Patient left: in chair;with call bell/phone within reach;with chair alarm set;with family/visitor present Nurse Communication: Mobility status PT Visit Diagnosis: Unsteadiness on feet (R26.81);Other symptoms and signs involving the nervous system (R29.898);Muscle weakness (generalized) (M62.81)     Time: [id number] PT Time Calculation (min) (ACUTE ONLY): 47 min  Charges:  $Gait Training: 23-37 mins $Therapeutic Exercise: 8-22 mins                      Arby Barrette, PT Pager [phone]    Rexanne Mano 12/22/2019, 11:49 AM

## 2019-12-22 NOTE — TOC Progression Note (Signed)
Transition of Care [facility]) - Progression Note    Patient Details  Name: Alex Christensen MRN: [id number] Date of Birth: 1941-01-11  Transition of Care [facility]) CM/SW Contact  Pollie Friar, RN Phone Number: 12/22/2019, 2:11 PM  Clinical Narrative:    [facility] inpatient rehab is saying they wont have a bed for him this week. CM has met with the patient and his spouse. They are agreeable to having his information faxed to [facility].  CM is awaiting to hear back from [facility] if they are able to accept the patient for rehab.    Expected Discharge Plan: IP Rehab Facility Barriers to Discharge: Continued Medical Work up  Expected Discharge Plan and Services Expected Discharge Plan: [place]   Discharge Planning Services: CM Consult   Living arrangements for the past 2 months: Single Family Home                                       Social Determinants of Health (SDOH) Interventions    Readmission Risk Interventions No flowsheet data found.

## 2019-12-23 ENCOUNTER — Inpatient Hospital Stay: Payer: Medicare Other | Admitting: Internal Medicine

## 2019-12-23 DIAGNOSIS — E785 Hyperlipidemia, unspecified: Secondary | ICD-10-CM | POA: Diagnosis present

## 2019-12-23 DIAGNOSIS — N183 Chronic kidney disease, stage 3 unspecified: Secondary | ICD-10-CM | POA: Diagnosis present

## 2019-12-23 DIAGNOSIS — I10 Essential (primary) hypertension: Secondary | ICD-10-CM | POA: Diagnosis present

## 2019-12-23 DIAGNOSIS — H933X1 Disorders of right acoustic nerve: Secondary | ICD-10-CM | POA: Diagnosis present

## 2019-12-23 DIAGNOSIS — D649 Anemia, unspecified: Secondary | ICD-10-CM | POA: Diagnosis present

## 2019-12-23 DIAGNOSIS — E871 Hypo-osmolality and hyponatremia: Secondary | ICD-10-CM | POA: Diagnosis present

## 2019-12-23 MED ORDER — CLOPIDOGREL BISULFATE 75 MG PO TABS: 75.0000 mg | ORAL_TABLET | Freq: Every day | ORAL | Status: DC

## 2019-12-23 MED ORDER — ASPIRIN 325 MG PO TABS: 325.0000 mg | ORAL_TABLET | Freq: Every day | ORAL | Status: AC

## 2019-12-23 NOTE — Discharge Summary (Addendum)
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 Stop after 3 months and continue plavix alone What changed: additional instructions   atorvastatin 10 MG tablet Commonly known as: LIPITOR Take 10 mg by mouth daily.   clopidogrel 75 MG tablet Commonly known as: PLAVIX Take 1 tablet (75 mg total) by mouth daily. Start taking on: Dec 24, 2019   ferrous sulfate 324 MG Tbec Take 324 mg by mouth daily.   imatinib 100 MG tablet Commonly known as: GLEEVEC Take 300 mg by mouth daily.   traZODone 50 MG tablet Commonly known as: DESYREL Take 75 mg by mouth at bedtime.        LABORATORY STUDIES CBC    Component Value Date/Time   WBC 9.7 12/19/2019 0237   RBC 2.85 (L) 12/19/2019 0237   HGB 9.7 (L) 12/19/2019 0237   HCT 28.1 (L) 12/19/2019 0237   PLT 251 12/19/2019 0237   MCV 98.6 12/19/2019 0237   MCH 34.0 12/19/2019 0237   MCHC 34.5 12/19/2019 0237   RDW 14.1 12/19/2019 0237   LYMPHSABS 1.7 12/17/2019 1807   MONOABS 0.6 12/17/2019 1807   EOSABS 0.3 12/17/2019 1807   BASOSABS 0.1 12/17/2019 1807   CMP    Component Value Date/Time   NA 132 (L) 12/20/2019 0403   K 3.7 12/20/2019 0403   CL 102  12/20/2019 0403   CO2 23 12/20/2019 0403   GLUCOSE 91 12/20/2019 0403   BUN 17 12/20/2019 0403   CREATININE 1.39 (H) 12/20/2019 0403   CALCIUM 8.6 (L) 12/20/2019 0403   PROT 6.1 (L) 12/17/2019 1807   ALBUMIN 3.7 12/17/2019 1807   AST 31 12/17/2019 1807   ALT 17 12/17/2019 1807   ALKPHOS 70 12/17/2019 1807   BILITOT 0.7 12/17/2019 1807   GFRNONAA 48 (L) 12/20/2019 0403   GFRAA 55 (L) 12/20/2019 0403   COAGS Lab Results  Component Value Date   INR 1.0 12/17/2019   Lipid Panel    Component Value Date/Time   CHOL 104 12/18/2019 0535   TRIG 46 12/18/2019 0535   HDL 49 12/18/2019 0535   CHOLHDL 2.1 12/18/2019 0535   VLDL 9 12/18/2019 0535   LDLCALC 46 12/18/2019 0535   HgbA1C  Lab Results  Component Value Date   HGBA1C 5.3 12/18/2019   Alcohol Level    Component Value Date/Time   ETH <10 12/17/2019 1807    SIGNIFICANT DIAGNOSTIC STUDIES CT Angio Neck W and/or Wo Contrast  Result Date: 12/17/2019 CLINICAL DATA:  Aphasia EXAM: CT ANGIOGRAPHY HEAD AND NECK TECHNIQUE: Multidetector CT imaging of the head and neck was performed using the standard protocol  during bolus administration of intravenous contrast. Multiplanar CT image reconstructions and MIPs were obtained to evaluate the vascular anatomy. Carotid stenosis measurements (when applicable) are obtained utilizing NASCET criteria, using the distal internal carotid diameter as the denominator. CONTRAST:  111mL OMNIPAQUE IOHEXOL 350 MG/ML SOLN COMPARISON:  None. FINDINGS: CTA NECK Aortic arch: Mild calcified and noncalcified plaque along the arch and patent great vessel origins. Right carotid system: Patent. There is calcified and noncalcified plaque along the proximal ICA causing minimal stenosis. Left carotid system: Patent. There is calcified and noncalcified plaque along the common carotid causing mild stenosis. Primarily calcified plaque along the proximal ICA causes minimal stenosis. Vertebral arteries: Patent. Plaque at the  origin on the right causing mild stenosis. Codominant. Skeleton: Mild degenerative changes of the cervical spine. Other neck: No mass or adenopathy. Upper chest: No apical lung mass. Review of the MIP images confirms the above findings CTA HEAD Anterior circulation: Intracranial internal carotid arteries are patent with calcified plaque causing mild to moderate stenosis. Anterior and middle cerebral arteries are patent. Mild to moderate stenosis of the distal right M1 MCA. There is short segment high-grade stenosis of a mid left M2 MCA branch within the sylvian fissure. Posterior circulation: Intracranial vertebral arteries are patent with calcified plaque causing moderate stenosis. Basilar artery is patent. Posterior cerebral arteries are patent. Moderate stenosis of the proximal right P2 PCA. A right posterior communicating artery is present. Venous sinuses: Patent as allowed by contrast bolus timing. Review of the MIP images confirms the above findings IMPRESSION: No large vessel occlusion. No hemodynamically significant stenosis in the neck. 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 Electronically Signed   By: Macy Mis M.D.   On: 12/17/2019 19:06   MR BRAIN WO CONTRAST  Result Date: 12/18/2019 CLINICAL DATA:  Stroke follow-up EXAM: MRI HEAD WITHOUT CONTRAST TECHNIQUE: Multiplanar, multiecho pulse sequences of the brain and surrounding structures were obtained without intravenous contrast. COMPARISON:  CT and CTA from yesterday FINDINGS: Brain: No acute infarction, hemorrhage, hydrocephalus, extra-axial collection or intra-axial mass lesion. 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 Dilated perivascular spaces below both basal ganglia. 6 mm mass in the right internal auditory canal. Vascular: Preserved flow voids Skull and upper cervical spine: Normal  marrow signal Sinuses/Orbits: Complete opacification of left maxillary, ethmoid, and frontal sinuses with central inspissated appearance. Bilateral mastoid opacification with negative nasopharynx. IMPRESSION: 1. No acute finding. 2. Extensive chronic small vessel ischemia. 3. Generalized brain atrophy. 4. 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 5. 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 6. Bilateral mastoid opacification. Electronically Signed   By: Monte Fantasia M.D.   On: 12/18/2019 18:35   EEG adult  Result Date: 12/18/2019 Lora Havens, MD     12/18/2019  5:31 PM Patient Name: Ran Sowells MRN: [id number] Epilepsy Attending: Lora Havens Referring Physician/Provider: Dr. Rosalin Hawking Date: 12/18/2019 Duration: 25.36 mins Patient history: 79 year old male presented with acute global amnesia.  EEG to evaluate for seizures. Level of alertness: Awake AEDs during EEG study: None Technical aspects: This EEG study was done with scalp electrodes positioned according to the 10-20 International system of electrode placement. Electrical activity was acquired at a sampling rate of 500Hz  and reviewed with a high frequency filter of 70Hz  and a low frequency filter of 1Hz . EEG data were recorded continuously  and digitally stored. Description: The posterior dominant rhythm consists of 8Hz  activity of moderate voltage (25-35 uV) seen predominantly in posterior head regions, symmetric and reactive to eye opening and eye closing.        Hyperventilation and photic stimulation were not performed. IMPRESSION: This study is within normal limits. No seizures or epileptiform discharges were seen throughout the recording. Lora Havens   ECHOCARDIOGRAM COMPLETE  Result Date: 12/18/2019    ECHOCARDIOGRAM REPORT   Patient Name:   JUNAID SUGUITAN Date of Exam: 12/18/2019 Medical Rec #:  [id number]      Height:       65.0 in Accession #:     [id number]     Weight:       117.1 lb Date of Birth:  Feb 19, 1941      BSA:          1.575 m Patient Age:    89 years       BP:           128/79 mmHg Patient Gender: M              HR:           74 bpm. Exam Location:  Inpatient Procedure: 2D Echo, Color Doppler and Cardiac Doppler Indications:    Stroke i163.9  History:        Patient has no prior history of Echocardiogram examinations.                 Risk Factors:Hypertension.  Sonographer:    Raquel Sarna Senior RDCS Referring Phys: [phone] ERIC LINDZEN  Sonographer Comments: Patient confused and mobile throughout exam. IMPRESSIONS  1. Left ventricular ejection fraction, by estimation, is 60 to 65%. The left ventricle has normal function. The left ventricle has no regional wall motion abnormalities. Left ventricular diastolic parameters are indeterminate.  2. Right ventricular systolic function is normal. The right ventricular size is normal. There is normal pulmonary artery systolic pressure.  3. Left atrial size was mild to moderately dilated.  4. Right atrial size was mildly dilated.  5. The mitral valve is grossly normal. Trivial mitral valve regurgitation. No evidence of mitral stenosis.  6. The aortic valve has an indeterminant number of cusps. Aortic valve regurgitation is not visualized. Mild to moderate aortic valve sclerosis/calcification is present, without any evidence of aortic stenosis.  7. The inferior vena cava is normal in size with greater than 50% respiratory variability, suggesting right atrial pressure of 3 mmHg. Comparison(s): No prior Echocardiogram. FINDINGS  Left Ventricle: Left ventricular ejection fraction, by estimation, is 60 to 65%. The left ventricle has normal function. The left ventricle has no regional wall motion abnormalities. The left ventricular internal cavity size was normal in size. There is  no left ventricular hypertrophy. Left ventricular diastolic parameters are indeterminate. Right Ventricle: The right ventricular size is  normal. No increase in right ventricular wall thickness. Right ventricular systolic function is normal. There is normal pulmonary artery systolic pressure. The tricuspid regurgitant velocity is 2.22 m/s, and  with an assumed right atrial pressure of 3 mmHg, the estimated right ventricular systolic pressure is [id number] mmHg. Left Atrium: Left atrial size was mild to moderately dilated. Right Atrium: Right atrial size was mildly dilated. Pericardium: A small pericardial effusion is present. Mitral Valve: The mitral valve is grossly normal. Severe mitral annular calcification. Trivial mitral valve regurgitation. No evidence of mitral valve stenosis. Tricuspid Valve: The tricuspid valve is normal in structure. Tricuspid valve regurgitation is trivial. No  evidence of tricuspid stenosis. Aortic Valve: The aortic valve has an indeterminant number of cusps. . There is moderate thickening and moderate calcification of the aortic valve. Aortic valve regurgitation is not visualized. Mild to moderate aortic valve sclerosis/calcification is present, without any evidence of aortic stenosis. There is moderate thickening of the aortic valve. There is moderate calcification of the aortic valve. Pulmonic Valve: The pulmonic valve was grossly normal. Pulmonic valve regurgitation is trivial. No evidence of pulmonic stenosis. Aorta: The aortic root, ascending aorta and aortic arch are all structurally normal, with no evidence of dilitation or obstruction. Venous: The inferior vena cava is normal in size with greater than 50% respiratory variability, suggesting right atrial pressure of 3 mmHg. IAS/Shunts: No atrial level shunt detected by color flow Doppler.  LEFT VENTRICLE PLAX 2D LVIDd:         3.70 cm  Diastology LVIDs:         2.40 cm  LV e' lateral:   7.78 cm/s LV PW:         1.10 cm  LV E/e' lateral: 7.9 LV IVS:        1.10 cm  LV e' medial:    5.98 cm/s LVOT diam:     2.00 cm  LV E/e' medial:  10.3 LV SV:         76 LV SV Index:   48  LVOT Area:     3.14 cm  RIGHT VENTRICLE RV S prime:     13.30 cm/s TAPSE (M-mode): 2.6 cm LEFT ATRIUM             Index       RIGHT ATRIUM           Index LA diam:        3.40 cm 2.16 cm/m  RA Area:     19.00 cm LA Vol (A2C):   55.7 ml 35.36 ml/m RA Volume:   46.40 ml  29.45 ml/m LA Vol (A4C):   43.2 ml 27.42 ml/m LA Biplane Vol: 49.5 ml 31.42 ml/m  AORTIC VALVE LVOT Vmax:   96.90 cm/s LVOT Vmean:  69.100 cm/s LVOT VTI:    0.243 m  AORTA Ao Root diam: 3.40 cm Ao Asc diam:  3.20 cm MITRAL VALVE               TRICUSPID VALVE MV Area (PHT): 2.29 cm    TR Peak grad:   19.7 mmHg MV Decel Time: 332 msec    TR Vmax:        222.00 cm/s MV E velocity: 61.60 cm/s MV A velocity: 94.00 cm/s  SHUNTS MV E/A ratio:  0.66        Systemic VTI:  0.24 m                            Systemic Diam: 2.00 cm Buford Dresser MD Electronically signed by Buford Dresser MD Signature Date/Time: 12/18/2019/11:21:28 AM    Final    CT HEAD CODE STROKE WO CONTRAST  Result Date: 12/17/2019 CLINICAL DATA:  Code stroke.  Left-sided weakness EXAM: CT HEAD WITHOUT CONTRAST TECHNIQUE: Contiguous axial images were obtained from the base of the skull through the vertex without intravenous contrast. COMPARISON:  None. FINDINGS: Motion artifact is present despite repeat imaging. Nondiagnostic evaluation at the level of the skull base and posterior fossa. Brain: No acute intracranial hemorrhage, mass effect, or edema. No acute appearing loss of gray-white differentiation. Confluent  areas of hypoattenuation in the supratentorial white matter are nonspecific but likely reflect advanced chronic microvascular ischemic changes. 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 Vascular: No hyperdense vessel. Skull: Unremarkable. Sinuses/Orbits: Left frontal, ethmoid, and maxillary sinus opacification. Other: None. ASPECTS ([place] Stroke Program Early CT Score) - Ganglionic level infarction (caudate, lentiform nuclei,  internal capsule, insula, M1-M3 cortex): 7 - Supraganglionic infarction (M4-M6 cortex): 3 Total score (0-10 with 10 being normal): 10 IMPRESSION: Nondiagnostic study through the skull base and posterior fossa. Otherwise, no acute intracranial hemorrhage or evidence of acute infarction. ASPECT score is 10. Advanced chronic microvascular ischemic changes. These results were communicated to Dr. Cheral Marker at 6:24 pmon 5/6/2021by text page via the [facility] messaging system. Electronically Signed   By: Macy Mis M.D.   On: 12/17/2019 18:27   CT ANGIO HEAD CODE STROKE  Result Date: 12/17/2019 CLINICAL DATA:  Aphasia EXAM: CT ANGIOGRAPHY HEAD AND NECK TECHNIQUE: Multidetector CT imaging of the head and neck was performed using the standard protocol during bolus administration of intravenous contrast. Multiplanar CT image reconstructions and MIPs were obtained to evaluate the vascular anatomy. Carotid stenosis measurements (when applicable) are obtained utilizing NASCET criteria, using the distal internal carotid diameter as the denominator. CONTRAST:  151mL OMNIPAQUE IOHEXOL 350 MG/ML SOLN COMPARISON:  None. FINDINGS: CTA NECK Aortic arch: Mild calcified and noncalcified plaque along the arch and patent great vessel origins. Right carotid system: Patent. There is calcified and noncalcified plaque along the proximal ICA causing minimal stenosis. Left carotid system: Patent. There is calcified and noncalcified plaque along the common carotid causing mild stenosis. Primarily calcified plaque along the proximal ICA causes minimal stenosis. Vertebral arteries: Patent. Plaque at the origin on the right causing mild stenosis. Codominant. Skeleton: Mild degenerative changes of the cervical spine. Other neck: No mass or adenopathy. Upper chest: No apical lung mass. Review of the MIP images confirms the above findings CTA HEAD Anterior circulation: Intracranial internal carotid arteries are patent with calcified plaque causing  mild to moderate stenosis. Anterior and middle cerebral arteries are patent. Mild to moderate stenosis of the distal right M1 MCA. There is short segment high-grade stenosis of a mid left M2 MCA branch within the sylvian fissure. Posterior circulation: Intracranial vertebral arteries are patent with calcified plaque causing moderate stenosis. Basilar artery is patent. Posterior cerebral arteries are patent. Moderate stenosis of the proximal right P2 PCA. A right posterior communicating artery is present. Venous sinuses: Patent as allowed by contrast bolus timing. Review of the MIP images confirms the above findings IMPRESSION: No large vessel occlusion. No hemodynamically significant stenosis in the neck. 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 Electronically Signed   By: Macy Mis M.D.   On: 12/17/2019 19:06       HISTORY OF PRESENT ILLNESS Goeffrey Berchtold is an 79 y.o. male presenting with acute onset of aphasia, agitation and right hemineglect. He was at home having dinner with his wife when he suddenly became unable to speak, with confusion. LKW 5:15 PM on 12/17/2019. EMS was called, and on arrival his CBG was 77. En route, the patient began to projectile vomit. He remained aphasic en route and continued to be unable to speak on arrival. He has no prior history of stroke. He has a history of HTN and CML. Home meds include ASA and atorvastatin. He was administered IV tPA and admitted to the neuro ICU.    HOSPITAL COURSE Mr.  Shammah Bornt is a 79 y.o. male with history of HTN and CML presenting with aphasia, agitation and R hemineglect with vomiting. Received IV tPA 12/17/2019 at [place].   Possible left brain TIA s/p tPA, could be due to left M2 high-grade stenosis.  Worsening of baseline gait difficulties Code Stroke CT head non-diagnostic skull base/posterior fossa, o/w no acute abnormality. ASPECTS 10.    CTA head & neck no LVO. L  M2 high-grade stenosis. Intracranial atherosclerosis. 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 2D Echo EF 60-65%. No source of embolus. LA dilated EEG within normal limits LDL 46 HgbA1c 5.3 aspirin 325 mg daily prior to admission, now on ASA 325mg  and plavix 75 DAPT for 3 months and then plavix alone given intracranial stenosis  Therapy recommendations:  CIR Disposition:  CIR at [facility]   Hypertension Home meds:  None listed Stable  Low BP treated w/ 250cc bolus NS over 5 h on  5/9 BP goal < 180/105 in hospital Long-term BP goal 130-150 given intracranial stenosis   Hyperlipidemia Home meds:  Lipitor 10 LDL 46, goal < 70 Resumed lipitor 10. Will not do high-intensity statin given current LDL less than goal and advanced age. 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 Manuella Ghazi in [place]. Hyponatremia Na 132  DISCHARGE EXAM Blood pressure 135/61, pulse 75, temperature 98.3 F (36.8 C), temperature source Axillary, resp. rate 16, height 5\' 5"  (1.651 m), weight 52.9 kg, SpO2 100 %. General - Well nourished, well developed, elderly male not in distress.   Ophthalmologic - fundi not visualized due to noncooperation.   Cardiovascular - Regular rate and rhythm.   Neuro - awake alert oriented x2.  Diminished attention, registration and recall.  Nonfluent hesitant speech he does follow commands briskly however.  Blinking to visual threat bilaterally but inconsistent, tracking bilaterally, perseveration on visual field confrontation testing. able to name and repeat.  Facial symmetrical, tongue midline. Gaze bilaterally. Moving all extremities against gravity and symmetric. Sensation subjectively symmetrical. FTN intact bilaterally. Gait mild apraxia with right leg while walking with a walker and physical therapist.   Discharge Diet  Heart healthy thin liquids  DISCHARGE PLAN Disposition:  Transfer to [facility] for ongoing PT, OT and ST aspirin 325 mg daily and clopidogrel 75 mg daily for secondary stroke prevention for 3 months then PLAVIX alone. Recommend ongoing stroke risk factor control by Primary Care Physician at time of discharge from inpatient rehabilitation. Follow-up PCP [place] Pink, MD in 2 weeks following discharge from rehab. Follow-up in [facility] in 4 weeks following discharge from rehab, office to schedule an appointment.   35 minutes were spent preparing discharge.  Burnetta Sabin, MSN, APRN, ANVP-BC, AGPCNP-BC Advanced Practice Stroke Nurse [place] for Schedule & Pager information 12/23/2019 11:30 AM    I have personally obtained history,examined this patient, reviewed notes, independently viewed imaging studies, participated in medical decision making and plan of care.ROS completed by me personally and pertinent positives fully documented  I have made any additions or clarifications directly to the above  note. Agree with note above.   Antony Contras, MD Medical Director [facility] Pager: [phone] 12/24/2019 2:30 PM

## 2019-12-23 NOTE — Plan of Care (Signed)
  Problem: Nutrition: Goal: Adequate nutrition will be maintained Outcome: Progressing   

## 2019-12-23 NOTE — Progress Notes (Signed)
Report given to nurse at facility.

## 2019-12-23 NOTE — TOC Transition Note (Signed)
Transition of Care [facility]) - CM/SW Discharge Note   Patient Details  Name: Decota Rudge MRN: [id number] Date of Birth: 22-Apr-1941  Transition of Care [facility] CM/SW Contact:  Pollie Friar, RN Phone Number: 12/23/2019, 1:05 PM   Clinical Narrative:    Pt discharging to [facility]. Wife called and made aware and in agreement with the plan. Pt transporting via [facility]. Report called, d/c packet at the desk and bedside RN updated.   Number for report: [phone]   Final next level of care: IP Rehab Facility Barriers to Discharge: No Barriers Identified   Patient Goals and CMS Choice   CMS Medicare.gov Compare Post Acute Care list provided to:: Patient Represenative (must comment) Choice offered to / list presented to : Spouse  Discharge Placement              Patient chooses bed at: ([facility]) Patient to be transferred to facility by: [facility] Name of family member notified: wife Patient and family notified of of transfer: 12/23/19  Discharge Plan and Services   Discharge Planning Services: CM Consult                                 Social Determinants of Health ([place]) Interventions     Readmission Risk Interventions No flowsheet data found.

## 2019-12-23 NOTE — Progress Notes (Signed)
STROKE TEAM PROGRESS NOTE   INTERVAL HISTORY Patient is sitting up in bed.  Is neurologically stable.  No changes.  Vital signs stable.  Patient is medically stable to be transferred to rehab if bed available.  His wife is at the bedside.  Vitals:   12/22/19 1558 12/22/19 1944 12/22/19 2322 12/23/19 0335  BP: 137/71 138/72 111/66 135/61  Pulse: 93 81 84 75  Resp: 18 16 16 16   Temp: 98.3 F (36.8 C) 98.3 F (36.8 C) (!) 97.5 F (36.4 C) 98.3 F (36.8 C)  TempSrc: Oral Oral Axillary Axillary  SpO2: 100% 100% 96% 100%  Weight:      Height:        CBC:  Recent Labs  Lab 12/17/19 1807 12/17/19 1807 12/17/19 1810 12/19/19 0237  WBC 6.8  --   --  9.7  NEUTROABS 4.2  --   --   --   HGB 10.0*   < > 9.5* 9.7*  HCT 28.8*   < > 28.0* 28.1*  MCV 98.3  --   --  98.6  PLT 278  --   --  251   < > = values in this interval not displayed.    Basic Metabolic Panel:  Recent Labs  Lab 12/19/19 0237 12/20/19 0403  NA 132* 132*  K 3.4* 3.7  CL 99 102  CO2 22 23  GLUCOSE 100* 91  BUN 19 17  CREATININE 1.09 1.39*  CALCIUM 8.5* 8.6*   IMAGING past 24 hours No results found.  PHYSICAL EXAM      General - Well nourished, well developed, elderly male not in distress.  Ophthalmologic - fundi not visualized due to noncooperation.  Cardiovascular - Regular rate and rhythm.  Neuro - awake alert oriented x2.  Diminished attention, registration and recall.  Nonfluent hesitant speech he does follow commands briskly however.  Blinking to visual threat bilaterally but inconsistent, tracking bilaterally, perseveration on visual field confrontation testing. able to name and repeat. Facial symmetrical, tongue midline. Gaze bilaterally. Moving all extremities against gravity and symmetric. Sensation subjectively symmetrical. FTN intact bilaterally. Gait mild apraxia with right leg while walking with a walker and physical therapist.    ASSESSMENT/PLAN Mr. Yona Fuhrmann is a 79 y.o. male with  history of HTN and CML presenting with aphasia, agitation and R hemineglect with vomiting. Received IV tPA 12/17/2019 at [facility].  Possible left brain TIA s/p tPA, could be due to left M2 high-grade stenosis.  Worsening of baseline gait difficulties  Code Stroke CT head non-diagnostic skull base/posterior fossa, o/w no acute abnormality. ASPECTS 10.     CTA head & neck no LVO. L M2 high-grade stenosis. Intracranial atherosclerosis.  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  2D Echo EF 60-65%. No source of embolus. LA dilated  EEG within normal limits  LDL 46  HgbA1c 5.3  SCDs for VTE prophylaxis  aspirin 325 mg daily prior to admission, now on ASA 325mg  and plavix 75 DAPT for 3 months and then plavix alone given intracranial stenosis   Therapy recommendations:  CIR  Disposition:  pending   Medically ready for d/c to CIR once bed available  Hypertension  Home meds:  None listed  Stable   Low BP treated w/ 250cc bolus NS over 5 h on  5/9 BP goal < 180/105 . Long-term BP goal 130-150 given intracranial stenosis  Hyperlipidemia  Home meds:  Lipitor 10  LDL 46, goal < 70  Resumed lipitor 10. Will not do high-intensity statin given current LDL less than goal and advanced age.  Continue statin at discharge  Dysphagia, resolved . Secondary to stroke . 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 Manuella Ghazi in [place].  Per daughter, pt has some baseline cognitive decline but not bad  Hyponatremia Na 132  Hospital day # 6  No neurological changes.  Remains stable for discharge to rehab if bed available  Continue ongoing therapies.   Discussed with wife at bedside and answered  questions.  She is open to looking at inpatient rehab facilities in [facility] in [place].  Discussed with rehab coordinator and social worker.  Greater than 50% time during this 25-minute visit was spent in counseling and coordination of care and answering questions Antony Contras, MD  To contact Stroke Continuity provider, please refer to [email or web address] After hours, contact General Neurology

## 2020-01-04 MED ORDER — GENERIC EXTERNAL MEDICATION
10.00 | Status: DC
Start: 2020-01-04 — End: 2020-01-04

## 2020-01-04 MED ORDER — ALUM & MAG HYDROXIDE-SIMETH 200-200-20 MG/5ML PO SUSP
30.00 | ORAL | Status: DC
Start: ? — End: 2020-01-04

## 2020-01-04 MED ORDER — IMATINIB MESYLATE 100 MG PO TABS
300.00 | ORAL_TABLET | ORAL | Status: DC
Start: 2020-01-05 — End: 2020-01-04

## 2020-01-04 MED ORDER — ONDANSETRON HCL 4 MG/2ML IJ SOLN
4.00 | INTRAMUSCULAR | Status: DC
Start: ? — End: 2020-01-04

## 2020-01-04 MED ORDER — FERROUS SULFATE 325 (65 FE) MG PO TABS
325.00 | ORAL_TABLET | ORAL | Status: DC
Start: 2020-01-05 — End: 2020-01-04

## 2020-01-04 MED ORDER — AMLODIPINE BESYLATE 2.5 MG PO TABS
2.50 | ORAL_TABLET | ORAL | Status: DC
Start: 2020-01-05 — End: 2020-01-04

## 2020-01-04 MED ORDER — LACTATED RINGERS IV SOLN
INTRAVENOUS | Status: DC
Start: ? — End: 2020-01-04

## 2020-01-04 MED ORDER — POLYETHYLENE GLYCOL 3350 17 GM/SCOOP PO POWD
17.00 | ORAL | Status: DC
Start: ? — End: 2020-01-04

## 2020-01-04 MED ORDER — ALBUTEROL SULFATE (2.5 MG/3ML) 0.083% IN NEBU
2.50 | INHALATION_SOLUTION | RESPIRATORY_TRACT | Status: DC
Start: ? — End: 2020-01-04

## 2020-01-04 MED ORDER — GENERIC EXTERNAL MEDICATION
3.38 | Status: DC
Start: 2020-01-04 — End: 2020-01-04

## 2020-01-04 MED ORDER — PANTOPRAZOLE SODIUM 40 MG IV SOLR
40.00 | INTRAVENOUS | Status: DC
Start: 2020-01-04 — End: 2020-01-04

## 2020-01-04 MED ORDER — ATORVASTATIN CALCIUM 10 MG PO TABS
10.00 | ORAL_TABLET | ORAL | Status: DC
Start: 2020-01-04 — End: 2020-01-04

## 2020-01-04 MED ORDER — POTASSIUM CHLORIDE CRYS ER 20 MEQ PO TBCR
40.00 | EXTENDED_RELEASE_TABLET | ORAL | Status: DC
Start: 2020-01-04 — End: 2020-01-04

## 2020-01-04 MED ORDER — NITROGLYCERIN 0.4 MG SL SUBL
0.40 | SUBLINGUAL_TABLET | SUBLINGUAL | Status: DC
Start: ? — End: 2020-01-04

## 2020-01-04 MED ORDER — GABAPENTIN 100 MG PO CAPS
100.00 | ORAL_CAPSULE | ORAL | Status: DC
Start: ? — End: 2020-01-04

## 2020-01-04 MED ORDER — BISACODYL 10 MG RE SUPP
10.00 | RECTAL | Status: DC
Start: ? — End: 2020-01-04

## 2020-01-04 MED ORDER — ONDANSETRON HCL 4 MG PO TABS
4.00 | ORAL_TABLET | ORAL | Status: DC
Start: ? — End: 2020-01-04

## 2020-01-04 MED ORDER — MELATONIN 3 MG PO TABS
3.00 | ORAL_TABLET | ORAL | Status: DC
Start: ? — End: 2020-01-04

## 2020-01-16 ENCOUNTER — Telehealth: Payer: Self-pay | Admitting: Oncology

## 2020-01-16 MED ORDER — SALINE NASAL SPRAY 0.65 % NA SOLN
2.00 | NASAL | Status: DC
Start: 2020-01-18 — End: 2020-01-16

## 2020-01-16 MED ORDER — PNEUMOCOCCAL VAC POLYVALENT 25 MCG/0.5ML IJ INJ
0.50 | INJECTION | INTRAMUSCULAR | Status: DC
Start: ? — End: 2020-01-16

## 2020-01-16 MED ORDER — UNNA-FLEX ELASTIC UNNA BOOT EX MISC
CUTANEOUS | Status: DC
Start: ? — End: 2020-01-16

## 2020-01-16 MED ORDER — HEPARIN SODIUM (PORCINE) 5000 UNIT/ML IJ SOLN
5000.00 | INTRAMUSCULAR | Status: DC
Start: 2020-01-18 — End: 2020-01-16

## 2020-01-16 MED ORDER — SIMETHICONE 40 MG/0.6ML PO SUSP
80.00 | ORAL | Status: DC
Start: ? — End: 2020-01-16

## 2020-01-16 MED ORDER — METOCLOPRAMIDE HCL 5 MG/ML IJ SOLN
5.00 | INTRAMUSCULAR | Status: DC
Start: 2020-01-18 — End: 2020-01-16

## 2020-01-16 MED ORDER — NUTRISOURCE FIBER PO PACK
1.00 | PACK | ORAL | Status: DC
Start: 2020-01-18 — End: 2020-01-16

## 2020-01-16 MED ORDER — ASPIRIN 81 MG PO CHEW
81.00 | CHEWABLE_TABLET | ORAL | Status: DC
Start: 2020-01-19 — End: 2020-01-16

## 2020-01-16 MED ORDER — GENERIC EXTERNAL MEDICATION
Status: DC
Start: 2020-01-18 — End: 2020-01-16

## 2020-01-16 MED ORDER — CLOPIDOGREL BISULFATE 75 MG PO TABS
75.00 | ORAL_TABLET | ORAL | Status: DC
Start: 2020-01-19 — End: 2020-01-16

## 2020-01-16 MED ORDER — IMATINIB MESYLATE 100 MG PO TABS
300.00 | ORAL_TABLET | ORAL | Status: DC
Start: 2020-01-18 — End: 2020-01-16

## 2020-01-16 MED ORDER — GENERIC EXTERNAL MEDICATION
7.50 | Status: DC
Start: 2020-01-18 — End: 2020-01-16

## 2020-01-16 MED ORDER — ACETAMINOPHEN 160 MG/5ML PO SOLN
650.00 | ORAL | Status: DC
Start: 2020-01-18 — End: 2020-01-16

## 2020-01-16 MED ORDER — GENERIC EXTERNAL MEDICATION
40.00 | Status: DC
Start: 2020-01-18 — End: 2020-01-16

## 2020-01-16 MED ORDER — VANCOMYCIN HCL 50 MG/ML PO SOLR
125.00 | ORAL | Status: DC
Start: 2020-01-18 — End: 2020-01-16

## 2020-01-16 NOTE — Telephone Encounter (Signed)
Patient's wife [name] called reporting that patient is currently admitted at [facility] due to GI bleeding and C. difficile colitis.  Patient also have dysphagia.  Patient has a CML and has been on [place] for many years. Per [name], currently she was told by [facility] that they cannot give Gleevec to patient because patient cannot swallow.  [name] says she does not understand why patient cannot swallow as patient has been swallowing other medications.  [name] informs me that doctors there are planning to switch patient to other medications.  She is very concerned about that.  I discussed with patient that I have limited information about what kind of circumstances patient currently seeing.  I recommend [name] to further discuss with hospitalist at [facility] says she has tried to talk to the hospitalist there but has not been given a satisfactory answer.  I explained to patient that since currently he is at an outside facility, it would be best for [name] to discuss with physicians there.  If necessary, she may request to discuss with an oncologist at [facility] regarding the new medication that patient is going to be started.  [facility] physicians can also contact me or Dr. Rogue Bussing on Monday if there is any questions or concerns.  [name] appreciates the call.

## 2020-01-18 MED ORDER — POTASSIUM CHLORIDE CRYS ER 20 MEQ PO TBCR
20.00 | EXTENDED_RELEASE_TABLET | ORAL | Status: DC
Start: 2020-01-18 — End: 2020-01-18

## 2020-01-18 MED ORDER — POTASSIUM & SODIUM PHOSPHATES 280-160-250 MG PO PACK
2.00 | PACK | ORAL | Status: DC
Start: 2020-01-18 — End: 2020-01-18

## 2020-01-18 NOTE — Telephone Encounter (Signed)
Thank, for speaking to family; and the detailed note.   I will reach out to the pt's wife. GB

## 2020-01-20 ENCOUNTER — Telehealth: Payer: Self-pay | Admitting: Internal Medicine

## 2020-01-20 NOTE — Telephone Encounter (Signed)
On 6/07- Spoke to pt's wife re: regarding her concerns regarding imatinib administration and patient has been hospital admitted to [facility] for stroke.Marland Kitchen Recommend she have the hospitalist service call me directly; or else have the oncology team be consulted for further directions while in the hospital.  Patient's wife will call me for follow-up once discharged in the hospital/rehab.  FYI

## 2020-01-22 ENCOUNTER — Other Ambulatory Visit: Payer: Self-pay | Admitting: *Deleted

## 2020-01-22 ENCOUNTER — Telehealth: Payer: Self-pay | Admitting: Internal Medicine

## 2020-01-22 DIAGNOSIS — C9210 Chronic myeloid leukemia, BCR/ABL-positive, not having achieved remission: Secondary | ICD-10-CM

## 2020-01-22 NOTE — Telephone Encounter (Signed)
[facility] returned my phone call and would like to move the apt to 6/28 as patient will be going to rehab. I scheduled the new date/time for Kristi in navigation. She will inform patient of new apt time.

## 2020-01-22 NOTE — Telephone Encounter (Signed)
C- please schedule follow up in 1 week; MD; labs- cbc/cmp-Dr.B

## 2020-01-22 NOTE — Telephone Encounter (Signed)
Alex Christensen Nurse navagator from [facility] would like to schedule a follow-up appt for patient. He his currently admitted and would like to have something in place before discharge. This number to her direct line, [phone]. Thank you

## 2020-01-28 ENCOUNTER — Inpatient Hospital Stay: Payer: Medicare Other | Admitting: Internal Medicine

## 2020-01-28 ENCOUNTER — Inpatient Hospital Stay: Payer: Medicare Other

## 2020-01-30 ENCOUNTER — Other Ambulatory Visit: Payer: Self-pay | Admitting: Internal Medicine

## 2020-02-01 ENCOUNTER — Telehealth: Payer: Self-pay | Admitting: Internal Medicine

## 2020-02-01 NOTE — Telephone Encounter (Signed)
On 6/21-spoke to hematology fellow, Dr.Kannan regarding patient's BCR ABL results.   FYI

## 2020-02-03 ENCOUNTER — Telehealth: Payer: Self-pay | Admitting: Internal Medicine

## 2020-02-03 NOTE — Telephone Encounter (Signed)
Someone on behalf of the family called to cancel his 6/28 appt.. They stated he has been in the hospital for the past 6 weeks and he will be unable to attend. They stated to contact his wife with any questions.

## 2020-02-08 ENCOUNTER — Inpatient Hospital Stay: Payer: Medicare Other | Admitting: Internal Medicine

## 2020-02-08 ENCOUNTER — Inpatient Hospital Stay: Payer: Medicare Other

## 2020-02-17 ENCOUNTER — Other Ambulatory Visit: Payer: Medicare Other | Admitting: Nurse Practitioner

## 2020-02-17 ENCOUNTER — Non-Acute Institutional Stay: Payer: Medicare Other | Admitting: Nurse Practitioner

## 2020-02-17 ENCOUNTER — Other Ambulatory Visit: Payer: Self-pay

## 2020-02-17 DIAGNOSIS — Z515 Encounter for palliative care: Secondary | ICD-10-CM

## 2020-02-17 DIAGNOSIS — C9210 Chronic myeloid leukemia, BCR/ABL-positive, not having achieved remission: Secondary | ICD-10-CM

## 2020-02-17 NOTE — Progress Notes (Signed)
Alex Christensen Consult Note Telephone: [phone]  Fax: [phone]  PATIENT NAME: Alex Christensen DOB: 1941-05-13 MRN: [id number]  PRIMARY CARE PROVIDER:   Maryland Pink, MD  REFERRING PROVIDER:  Maryland Pink, MD [address] [facility] [place],  [place] [address]  RESPONSIBLE PARTY:   Self; wife Sherri Levenhagen [phone]  RECOMMENDATIONS and PLAN:  1. ACP: DNR; treat what is treatable  2. Palliative care encounter; Palliative medicine team will continue to support patient, patient's family, and medical team. Visit consisted of counseling and education dealing with the complex and emotionally intense issues of symptom management and palliative care in the setting of serious and potentially life-threatening illness  I spent 90 minutes providing this consultation, start at 1:00pm. More than 50% of the time in this consultation was spent coordinating communication.   HISTORY OF PRESENT ILLNESS:  Alex Christensen is a 79 y.o. year old male with multiple medical problems including CML, TIA left brain s/p TPA, HTN, chronic kidney disease stage Illa, acoustic neuroma ritis, hyperlipidemia, anemia. Hospitalized 5/6 / 2021 to 5 / 12 / 2021 TIA left brain s/p TPA. During hospitalization he received a gastrostomy tube for tube feedings essential to sustain life.   [name] is currently residing at Short-Term Rehab at [facility]. Mr Condrey does require assistance for positioning, turning, transfers, mobility, adl's. Mr Doren does feed himself but it does take some time for him to eat. [name] also receives two feedings during the night for supplemental as his normal intake is about 25% or less per meal. Staff endorses [name] has been working with therapy as he is able to move in the bed, like exercises and stand with therapy. [name] is able to answer questions and dvocate his needs. At present [name] is lying in  bed. [name] appears pale, weak but comfortable. [name] is at bedside. I visited and observed [name]. We talked about purpose of palliative care visit. [name] in agreement. We talked about the last time he was independent at home driving which was about a year ago. Mr. Pulse progressed to walking with a walker and needing more assistance with ADLs though prior to this past hospitalization he was fairly independent. We talked about past medical history. We talked about recent hospitalization with TIA requiring TPA. We talked about tube feedings. [name] talked at length about diagnosis of CML 20 years ago and weight loss over the last several years. [name] endorsed the Physicians been working with them have not been able to identify the actual cause of the weight loss. We talked about how Mr. Jaroszewski has been doing since he has been at short-term rehab with therapy. [name] endorses he has been working with therapy when they come. [name] endorses he has been require assistance to go from sitting in the bed to standing but he has been motivated to work with therapy. We talked about what the goals are for therapy. [name] endorsed his wishes are that he does return home, gain strength in is able to be more independent. When asked what it is that he will need to do in order for him to be able to return home with her as her primary caregiver. [name] endorses he will need to be able to get up from the bed or a chair without assistance and ambulate short distances. We talked about realistic expectations. We talked about  taking one day at a time, seeing how he does with therapy. We talked about the weakness, overall debility and decline how am I giving him time to heal and gain more strength. We talked about his appetite in tube feedings. [name] endorses that [name] is used to having more seasoned food as she uses more herbs at home. We talked about the two feedings  typically he has been getting at night though currently he is receiving. [name] endorses she is not sure if they ended up getting a late start or just did not take it down this morning. We talked about medical goals of care at length. We talked about natural progression of aging in the setting of chronic disease such as CML. We talked about role palliative care and plan of care. [name] did ask about Hospice Services. We did talk about Hospice Services being a Medicare benefit, what services are provided and hospice criteria for eligibility. We talked about should [name] becomes stronger where he is able to return home can further discuss options to see if Hospice would be more of a benefit versus therapy at home. We talked about home health therapy transitioning back home with PT / OT for transferring, safety for a short of time. Mrs. Sees talked at length about the amount of time that Mr. Power has been going from hospitalization to a rehab back to the hospital now to [facility] health care. We talked about overall exhaustion. 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 [name] does have two children prior to his marriage with [name]. We talked about follow up palliative care visit in one week or soon or should he declined. Therapeutic listening and emotional support provided. Contact information. Questions answered to satisfaction. I updated nursing staff as currently will just see how he does with therapy through the next week and his appetite. [name] is a DNR. Palliative Care was asked to help address goals of care.   CODE STATUS: DNR  PPS: 40% HOSPICE ELIGIBILITY/DIAGNOSIS: TBD  PAST MEDICAL HISTORY:  Past Medical History:  Diagnosis Date  . High blood pressure   . 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  Smoking status: Former Research scientist (life sciences)  . Smokeless tobacco: Never Used  Substance Use Topics  . Alcohol use: Yes    ALLERGIES: No Known Allergies   PERTINENT MEDICATIONS:  Outpatient Encounter Medications as of 02/17/2020  Medication Sig  . aspirin 325 MG tablet Take 1 tablet (325 mg total) by mouth daily. Stop after 3 months and continue plavix alone  . atorvastatin (LIPITOR) 10 MG tablet Take 10 mg by mouth daily.   . clopidogrel (PLAVIX) 75 MG tablet Take 1 tablet (75 mg total) by mouth daily.  . ferrous sulfate 324 MG TBEC Take 324 mg by mouth daily.  Marland Kitchen imatinib (GLEEVEC) 100 MG tablet Take 300 mg by mouth daily.   . traZODone (DESYREL) 50 MG tablet Take 75 mg by mouth at bedtime.    No facility-administered encounter medications on file as of 02/17/2020.    PHYSICAL EXAM:   General: NAD, frail appearing, thin pleasant male Cardiovascular: regular rate and rhythm Pulmonary: clear ant fields; decrease airmovement Neurological: generalized weakness Greenley Martone Ihor Gully, NP

## 2020-02-23 ENCOUNTER — Ambulatory Visit: Payer: Medicare Other | Admitting: Internal Medicine

## 2020-02-23 ENCOUNTER — Other Ambulatory Visit: Payer: Medicare Other

## 2020-03-02 ENCOUNTER — Non-Acute Institutional Stay: Payer: Medicare Other | Admitting: Nurse Practitioner

## 2020-03-02 ENCOUNTER — Other Ambulatory Visit: Payer: Self-pay

## 2020-03-02 ENCOUNTER — Encounter: Payer: Self-pay | Admitting: Nurse Practitioner

## 2020-03-02 VITALS — Resp 18 | Wt 130.0 lb

## 2020-03-02 DIAGNOSIS — Z515 Encounter for palliative care: Secondary | ICD-10-CM

## 2020-03-02 DIAGNOSIS — C9210 Chronic myeloid leukemia, BCR/ABL-positive, not having achieved remission: Secondary | ICD-10-CM

## 2020-03-02 NOTE — Progress Notes (Signed)
[facility] Consult Note Telephone: [phone]  Fax: [phone]  PATIENT NAME: Alex Christensen DOB: September 14, 1940 MRN: [id number]  PRIMARY CARE PROVIDER:   Maryland Pink, MD  REFERRING PROVIDER:  Maryland Pink, MD [address] [facility] [place],  [place] [address] RESPONSIBLE PARTY:   Self; [name] [phone]  RECOMMENDATIONS and PLAN: 1.ACP: DNR; treat what is treatable 2.Palliative care encounter; Palliative medicine team will continue to support patient, patient's family, and medical team. Visit consisted of counseling and education dealing with the complex and emotionally intense issues of symptom management and palliative care in the setting of serious and potentially life-threatening illness  I spent 60 minutes providing this consultation,  Start at 9:00am More than 50% of the time in this consultation was spent coordinating communication.   HISTORY OF PRESENT ILLNESS:  Alex Christensen is a 79 y.o. year old male with multiple medical problems including CML, TIA left brain s/p TPA, HTN, chronic kidney disease stage Illa, acoustic neuroma ritis, hyperlipidemia, anemia. Hospitalized 5/6 / 2021 to 5 / 12 / 2021 TIA left brain s/p TPA. During hospitalization he received a gastrostomy tube for tube feedings essential to sustain life.Alex Christensen continues to reside at [place] [facility]. Alex Christensen does have good days and more difficult days for staff. Alex Christensen does require assistance for mobility, transfers. Alex Christensen has been walking short distances with a walker with therapy on some days. Other days staff endorses he is having more difficulty with sanding and ambulation. Alex Christensen does require assistance with adl's, toileting. Alex Christensen does continue to get two feedings during the night and trays during the day. Alex Christensen has been eating small amounts typically less than 25%. A staff to  get a current wait. Alex Christensen does have times where he's a little more confused but sleeping a lot. Staff endorses no new concerns. At present Alex Christensen is lying in bed, appears pale, thin, debilitated frigile male who is comfortable. No visitors present. I visited and observed Alex Christensen. Talked about purpose of palliative care visit. Alex Christensen in agreement. We talked about symptoms of pain and shortness of breath which Alex Christensen denies. We talked about overall weakness and fatigue. Alex Christensen endorses he is tired. Alex Christensen fell asleep during palliative care visit. We talked about walking with therapy but it is limited. Alex Christensen did make eye contact with verbal cues. Alex Christensen was cooperative with the assessment. Alex Christensen does appear exhausted overall. We talked about appetite. Alex Christensen endorses he is not really hungry. We talked about wishes for therapy and he replies he wished he had more energy. We talked about role palliative care. Asked if it was okay to contact Alex Christensen. Alex Christensen was in agreement. Emotional support provided. I called Alex Christensen. We talked about palliative care visit with Alex Christensen. Clinical update discuss. We talked about his progress with therapy which has been slow. We talked about some days are good and some days are more difficult. We talked about concern for slow progression. We talked about taking one day at a time. We reviewed medical goals of care. We talked about Services upon discharge including Home Health with palliative care versus home Hospice. We talked about hospice benefit under Medicare program. We talked about what services are provided. We talked about what Home Health would look like with therapy at home. We talked about recent care plan meeting and at  this time there no discharge day in place. Alex Christensen talked about Alex Christensen walking 40 feet a few days ago and then the next day he was completely exhausted. We talked about recent catastrophic events with  hospitalizations and reoccurrence in and out of short-term rehab. We talked about chronic disease progression with acute illness of C difficile. We talked about symptoms of fatigue, weakness and sleeping. We talked about his appetite into feeding. We talked about medical goals of care. We talked about role of palliative care and plan of care. Will continue to follow monitor with next visit and two weeks if needed or sooner should he decline or discharge sooner than that time. Alex Christensen in agreement. Therapeutic listening and emotional support provided. Contact information. Questions answered to satisfaction. I updated nursing staff . Palliative Care was asked to help to continue to address goals of care.   CODE STATUS: DNR  PPS: 40% HOSPICE ELIGIBILITY/DIAGNOSIS: TBD  PAST MEDICAL HISTORY:  Past Medical History:  Diagnosis Date  . High blood pressure   . 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 Smoking status: Former Research scientist (life sciences)  . Smokeless tobacco: Never Used  Substance Use Topics  . Alcohol use: Yes    ALLERGIES: No Known Allergies   PERTINENT MEDICATIONS:  Outpatient Encounter Medications as of 03/02/2020  Medication Sig  . aspirin 325 MG tablet Take 1 tablet (325 mg total) by mouth daily. Stop after 3 months and continue plavix alone  . atorvastatin (LIPITOR) 10 MG tablet Take 10 mg by mouth daily.   . clopidogrel (PLAVIX) 75 MG tablet Take 1 tablet (75 mg total) by mouth daily.  . ferrous sulfate 324 MG TBEC Take 324 mg by mouth daily.  Marland Kitchen imatinib (GLEEVEC) 100 MG tablet Take 300 mg by mouth daily.   . traZODone (DESYREL) 50 MG tablet Take 75 mg by mouth at bedtime.    No facility-administered encounter medications on file as of 03/02/2020.    PHYSICAL EXAM:   General: NAD, frail appearing, thin, debilitated, pale male Cardiovascular: regular rate and rhythm Pulmonary: clear ant fields Neurological: generalized weakness  Alex Sek Ihor Gully, NP

## 2020-03-09 ENCOUNTER — Inpatient Hospital Stay
Admission: EM | Admit: 2020-03-09 | Discharge: 2020-03-15 | DRG: 698 | Disposition: A | Discharge status: Skilled Nursing Facility | Payer: Medicare Other | Attending: Internal Medicine | Admitting: Internal Medicine

## 2020-03-09 ENCOUNTER — Other Ambulatory Visit: Payer: Self-pay

## 2020-03-09 ENCOUNTER — Encounter: Payer: Self-pay | Admitting: Nurse Practitioner

## 2020-03-09 ENCOUNTER — Telehealth: Payer: Self-pay | Admitting: *Deleted

## 2020-03-09 ENCOUNTER — Non-Acute Institutional Stay: Payer: Medicare Other | Admitting: Nurse Practitioner

## 2020-03-09 DIAGNOSIS — E785 Hyperlipidemia, unspecified: Secondary | ICD-10-CM | POA: Diagnosis present

## 2020-03-09 DIAGNOSIS — I10 Essential (primary) hypertension: Secondary | ICD-10-CM | POA: Diagnosis present

## 2020-03-09 DIAGNOSIS — N183 Chronic kidney disease, stage 3 unspecified: Secondary | ICD-10-CM | POA: Diagnosis present

## 2020-03-09 DIAGNOSIS — K9423 Gastrostomy malfunction: Secondary | ICD-10-CM | POA: Diagnosis not present

## 2020-03-09 DIAGNOSIS — R64 Cachexia: Secondary | ICD-10-CM | POA: Diagnosis present

## 2020-03-09 DIAGNOSIS — Z7902 Long term (current) use of antithrombotics/antiplatelets: Secondary | ICD-10-CM | POA: Diagnosis not present

## 2020-03-09 DIAGNOSIS — T83511A Infection and inflammatory reaction due to indwelling urethral catheter, initial encounter: Principal | ICD-10-CM | POA: Diagnosis present

## 2020-03-09 DIAGNOSIS — Z79899 Other long term (current) drug therapy: Secondary | ICD-10-CM | POA: Diagnosis not present

## 2020-03-09 DIAGNOSIS — A0471 Enterocolitis due to Clostridium difficile, recurrent: Secondary | ICD-10-CM | POA: Diagnosis present

## 2020-03-09 DIAGNOSIS — C9210 Chronic myeloid leukemia, BCR/ABL-positive, not having achieved remission: Secondary | ICD-10-CM | POA: Diagnosis present

## 2020-03-09 DIAGNOSIS — A0472 Enterocolitis due to Clostridium difficile, not specified as recurrent: Secondary | ICD-10-CM | POA: Diagnosis present

## 2020-03-09 DIAGNOSIS — Z20822 Contact with and (suspected) exposure to covid-19: Secondary | ICD-10-CM | POA: Diagnosis present

## 2020-03-09 DIAGNOSIS — A419 Sepsis, unspecified organism: Secondary | ICD-10-CM | POA: Diagnosis present

## 2020-03-09 DIAGNOSIS — A414 Sepsis due to anaerobes: Secondary | ICD-10-CM | POA: Diagnosis present

## 2020-03-09 DIAGNOSIS — Z7982 Long term (current) use of aspirin: Secondary | ICD-10-CM | POA: Diagnosis not present

## 2020-03-09 DIAGNOSIS — Z87891 Personal history of nicotine dependence: Secondary | ICD-10-CM | POA: Diagnosis not present

## 2020-03-09 DIAGNOSIS — D509 Iron deficiency anemia, unspecified: Secondary | ICD-10-CM | POA: Diagnosis present

## 2020-03-09 DIAGNOSIS — Y846 Urinary catheterization as the cause of abnormal reaction of the patient, or of later complication, without mention of misadventure at the time of the procedure: Secondary | ICD-10-CM | POA: Diagnosis present

## 2020-03-09 DIAGNOSIS — I129 Hypertensive chronic kidney disease with stage 1 through stage 4 chronic kidney disease, or unspecified chronic kidney disease: Secondary | ICD-10-CM | POA: Diagnosis present

## 2020-03-09 DIAGNOSIS — Z8673 Personal history of transient ischemic attack (TIA), and cerebral infarction without residual deficits: Secondary | ICD-10-CM

## 2020-03-09 DIAGNOSIS — Z8619 Personal history of other infectious and parasitic diseases: Secondary | ICD-10-CM | POA: Diagnosis not present

## 2020-03-09 DIAGNOSIS — L8990 Pressure ulcer of unspecified site, unspecified stage: Secondary | ICD-10-CM | POA: Insufficient documentation

## 2020-03-09 DIAGNOSIS — D72829 Elevated white blood cell count, unspecified: Secondary | ICD-10-CM | POA: Diagnosis present

## 2020-03-09 DIAGNOSIS — R1310 Dysphagia, unspecified: Secondary | ICD-10-CM | POA: Diagnosis present

## 2020-03-09 DIAGNOSIS — E43 Unspecified severe protein-calorie malnutrition: Secondary | ICD-10-CM | POA: Diagnosis present

## 2020-03-09 DIAGNOSIS — A09 Infectious gastroenteritis and colitis, unspecified: Secondary | ICD-10-CM

## 2020-03-09 DIAGNOSIS — Z515 Encounter for palliative care: Secondary | ICD-10-CM

## 2020-03-09 DIAGNOSIS — N390 Urinary tract infection, site not specified: Secondary | ICD-10-CM | POA: Diagnosis present

## 2020-03-09 LAB — GASTROINTESTINAL PANEL BY PCR, STOOL (REPLACES STOOL CULTURE)

## 2020-03-09 LAB — CBC WITH DIFFERENTIAL/PLATELET
Abs Immature Granulocytes: 0.09 10*3/uL — ABNORMAL HIGH (ref 0.00–0.07)
Basophils Absolute: 0.1 10*3/uL (ref 0.0–0.1)
Basophils Relative: 0 %
Eosinophils Absolute: 0.3 10*3/uL (ref 0.0–0.5)
Eosinophils Relative: 1 %
HCT: 27.7 % — ABNORMAL LOW (ref 39.0–52.0)
Hemoglobin: 9.7 g/dL — ABNORMAL LOW (ref 13.0–17.0)
Immature Granulocytes: 1 %
Lymphocytes Relative: 6 %
Lymphs Abs: 1.1 10*3/uL (ref 0.7–4.0)
MCH: 33.9 pg (ref 26.0–34.0)
MCHC: 35 g/dL (ref 30.0–36.0)
MCV: 96.9 fL (ref 80.0–100.0)
Monocytes Absolute: 0.7 10*3/uL (ref 0.1–1.0)
Monocytes Relative: 4 %
Neutro Abs: 15.3 10*3/uL — ABNORMAL HIGH (ref 1.7–7.7)
Neutrophils Relative %: 88 %
Platelets: 642 10*3/uL — ABNORMAL HIGH (ref 150–400)
RBC: 2.86 MIL/uL — ABNORMAL LOW (ref 4.22–5.81)
RDW: 15.3 % (ref 11.5–15.5)
WBC: 17.5 10*3/uL — ABNORMAL HIGH (ref 4.0–10.5)
nRBC: 0 % (ref 0.0–0.2)

## 2020-03-09 LAB — URINALYSIS, COMPLETE (UACMP) WITH MICROSCOPIC
Bilirubin Urine: NEGATIVE
Glucose, UA: NEGATIVE mg/dL
Hgb urine dipstick: NEGATIVE
Ketones, ur: NEGATIVE mg/dL
Nitrite: POSITIVE — AB
Protein, ur: >=300 mg/dL — AB
RBC / HPF: >50 RBC/hpf — ABNORMAL HIGH (ref 0–5)
Specific Gravity, Urine: 1.016 (ref 1.005–1.030)
Squamous Epithelial / HPF: NONE SEEN (ref 0–5)
WBC, UA: >50 WBC/hpf — ABNORMAL HIGH (ref 0–5)
pH: 8 (ref 5.0–8.0)

## 2020-03-09 LAB — COMPREHENSIVE METABOLIC PANEL
ALT: 28 U/L (ref 0–44)
AST: 22 U/L (ref 15–41)
Albumin: 2.7 g/dL — ABNORMAL LOW (ref 3.5–5.0)
Alkaline Phosphatase: 85 U/L (ref 38–126)
Anion gap: 10 (ref 5–15)
BUN: 35 mg/dL — ABNORMAL HIGH (ref 8–23)
CO2: 26 mmol/L (ref 22–32)
Calcium: 8.6 mg/dL — ABNORMAL LOW (ref 8.9–10.3)
Chloride: 102 mmol/L (ref 98–111)
Creatinine, Ser: 1.1 mg/dL (ref 0.61–1.24)
GFR calc Af Amer: >60 mL/min (ref >60)
GFR calc non Af Amer: >60 mL/min (ref >60)
Glucose, Bld: 102 mg/dL — ABNORMAL HIGH (ref 70–99)
Potassium: 4 mmol/L (ref 3.5–5.1)
Sodium: 138 mmol/L (ref 135–145)
Total Bilirubin: 0.6 mg/dL (ref 0.3–1.2)
Total Protein: 5.8 g/dL — ABNORMAL LOW (ref 6.5–8.1)

## 2020-03-09 LAB — SARS CORONAVIRUS 2 BY RT PCR (HOSPITAL ORDER, PERFORMED IN ~~LOC~~ HOSPITAL LAB): SARS Coronavirus 2: NEGATIVE

## 2020-03-09 LAB — C DIFFICILE QUICK SCREEN W PCR REFLEX
C Diff antigen: POSITIVE — AB
C Diff interpretation: DETECTED
C Diff toxin: POSITIVE — AB

## 2020-03-09 LAB — LACTIC ACID, PLASMA: Lactic Acid, Venous: 1.4 mmol/L (ref 0.5–1.9)

## 2020-03-09 MED ORDER — FERROUS SULFATE 325 (65 FE) MG PO TABS
324.0000 mg | ORAL_TABLET | Freq: Every day | ORAL | Status: DC
  Administered 2020-03-10 – 2020-03-11 (×2): 324 mg via ORAL
  Administered 2020-03-12: 325 mg via ORAL
  Administered 2020-03-13 – 2020-03-15 (×3): 324 mg via ORAL
  Filled 2020-03-09 (×6): qty 1

## 2020-03-09 MED ORDER — DEXTROSE-NACL 5-0.9 % IV SOLN
INTRAVENOUS | Status: DC
  Administered 2020-03-09: 1000 mL via INTRAVENOUS

## 2020-03-09 MED ORDER — HEPARIN SODIUM (PORCINE) 5000 UNIT/ML IJ SOLN
5000.0000 [IU] | Freq: Three times a day (TID) | INTRAMUSCULAR | Status: DC
  Administered 2020-03-09: 5000 [IU] via SUBCUTANEOUS
  Filled 2020-03-09: qty 1

## 2020-03-09 MED ORDER — IMATINIB MESYLATE 100 MG PO TABS: 300.0000 mg | ORAL_TABLET | Freq: Every day | ORAL | Status: DC

## 2020-03-09 MED ORDER — VANCOMYCIN 50 MG/ML ORAL SOLUTION: 125.0000 mg | ORAL | Status: DC

## 2020-03-09 MED ORDER — ONDANSETRON HCL 4 MG/2ML IJ SOLN: 4.0000 mg | Freq: Four times a day (QID) | INTRAMUSCULAR | Status: DC | PRN

## 2020-03-09 MED ORDER — LACTATED RINGERS IV BOLUS
1000.0000 mL | Freq: Once | INTRAVENOUS | Status: AC
  Administered 2020-03-09: 1000 mL via INTRAVENOUS

## 2020-03-09 MED ORDER — SODIUM CHLORIDE 0.9 % IV SOLN
1.0000 g | INTRAVENOUS | Status: DC
  Administered 2020-03-10 – 2020-03-12 (×3): 1 g via INTRAVENOUS
  Filled 2020-03-09 (×3): qty 1
  Filled 2020-03-09 (×2): qty 10

## 2020-03-09 MED ORDER — VANCOMYCIN 50 MG/ML ORAL SOLUTION: 125.0000 mg | Freq: Every day | ORAL | Status: DC

## 2020-03-09 MED ORDER — ONDANSETRON HCL 4 MG PO TABS: 4.0000 mg | ORAL_TABLET | Freq: Four times a day (QID) | ORAL | Status: DC | PRN

## 2020-03-09 MED ORDER — VANCOMYCIN 50 MG/ML ORAL SOLUTION: 125.0000 mg | Freq: Two times a day (BID) | ORAL | Status: DC

## 2020-03-09 MED ORDER — IMATINIB MESYLATE 100 MG PO TABS: 300.0000 mg | ORAL_TABLET | Freq: Every day | ORAL | 3 refills | Status: AC

## 2020-03-09 MED ORDER — ASPIRIN EC 325 MG PO TBEC
325.0000 mg | DELAYED_RELEASE_TABLET | Freq: Every day | ORAL | Status: DC
  Administered 2020-03-10 – 2020-03-15 (×6): 325 mg via ORAL
  Filled 2020-03-09 (×6): qty 1

## 2020-03-09 MED ORDER — ATORVASTATIN CALCIUM 10 MG PO TABS
10.0000 mg | ORAL_TABLET | Freq: Every day | ORAL | Status: DC
  Administered 2020-03-10 – 2020-03-15 (×6): 10 mg via ORAL
  Filled 2020-03-09 (×6): qty 1

## 2020-03-09 MED ORDER — CLOPIDOGREL BISULFATE 75 MG PO TABS
75.0000 mg | ORAL_TABLET | Freq: Every day | ORAL | Status: DC
  Administered 2020-03-10 – 2020-03-15 (×6): 75 mg via ORAL
  Filled 2020-03-09 (×6): qty 1

## 2020-03-09 MED ORDER — SODIUM CHLORIDE 0.9 % IV SOLN
1.0000 g | Freq: Once | INTRAVENOUS | Status: AC
  Administered 2020-03-09: 1 g via INTRAVENOUS
  Filled 2020-03-09: qty 10

## 2020-03-09 MED ORDER — VANCOMYCIN 50 MG/ML ORAL SOLUTION
125.0000 mg | Freq: Four times a day (QID) | ORAL | Status: DC
  Administered 2020-03-09 – 2020-03-15 (×25): 125 mg via ORAL
  Filled 2020-03-09 (×29): qty 2.5

## 2020-03-09 MED ORDER — TRAZODONE HCL 50 MG PO TABS
75.0000 mg | ORAL_TABLET | Freq: Every day | ORAL | Status: DC
  Administered 2020-03-10 – 2020-03-14 (×4): 75 mg via ORAL
  Filled 2020-03-09 (×4): qty 2

## 2020-03-09 NOTE — Telephone Encounter (Signed)
Spoke with patient's wife. patient is still in [facility] rehab. Patient has been using his home medications for Gleevac at the rehab facility.  [facility]. They are now waiting on EMS to transport patient to [facility] for uncontrollable diarrhea. cdiff + and a + UTI.  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  Wife is uncertain when patient will be d/c from [facility]. At this time, will hold off on scheduling follow-up until patient is discharged from [facility].

## 2020-03-09 NOTE — ED Notes (Signed)
Pt incontinent of mucus stool. Cleaned and barrier cream applied. Pt bottom red, excoriated

## 2020-03-09 NOTE — Telephone Encounter (Signed)
V/o given by Dr. Rogue Bussing to schedule patient for lab/md follow-up. V/o to send script for gleevac for patient to [facility]. I will contact wife with apts.

## 2020-03-09 NOTE — Telephone Encounter (Signed)
Dr. Jacinto Reap, Wife called and left vm for triage. She requested Gleevac prescription to be sent to [facility]. Patient has not been seen since March due to hospitalizations at [facility] and rehab. I do not see where we initiated prescribed this medication. Patient was a transfer of care from [place]. Please advise.

## 2020-03-09 NOTE — ED Provider Notes (Signed)
[facility] Emergency Department Provider Note   ____________________________________________   First MD Initiated Contact with Patient 03/09/20 1502     (approximate)  I have reviewed the triage vital signs and the nursing notes.   HISTORY  Chief Complaint Urinary Tract Infection    HPI Alex Christensen is a 79 y.o. male with past medical history of CML, hypertension, hyperlipidemia, CKD who presents to the ED for generalized weakness.  History is limited due to patient's debilitation.  Per EMS, staff at [place] has noticed that his urine has been cloudy recently with sediment collecting in the catheter and bag.  Patient has complained of some suprapubic discomfort but denies any fevers, flank pain, nausea, or vomiting.  His wife does state that he has had return of diarrhea over the past 2 days similar to his 2 prior episodes of C. difficile colitis.  He has been having frequent watery stools for the past 2 days.  He has a PEG tube in place due to poor p.o. intake in the past.        Past Medical History:  Diagnosis Date  . High blood pressure   . Leukemia, chronic myeloid [facility])     Patient Active Problem List   Diagnosis Date Noted  . C. difficile colitis 03/09/2020  . Sepsis secondary to UTI ([place]) 03/09/2020  . Leucocytosis 03/09/2020  . Essential hypertension 12/23/2019  . Hyperlipemia 12/23/2019  . Anemia 12/23/2019  . CKD (chronic kidney disease), stage IIIa 12/23/2019  . 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 Hyponatremia 12/23/2019  . 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[place]) 04/15/2019    Past Surgical History:  Procedure Laterality Date  . TOOTH EXTRACTION      Prior to Admission medications   Medication Sig Start Date End Date Taking? Authorizing Provider  aspirin 325 MG tablet Take 1 tablet (325 mg total) by mouth daily. Stop after 3 months and continue  plavix alone 12/23/19 03/22/20 Yes Biby, Massie Kluver, NP  atorvastatin (LIPITOR) 10 MG tablet Take 10 mg by mouth daily.  04/09/19 04/08/20 Yes [provider]  clopidogrel (PLAVIX) 75 MG tablet Take 1 tablet (75 mg total) by mouth daily. 12/24/19  Yes Donzetta Starch, NP  ferrous sulfate 324 MG TBEC Take 324 mg by mouth daily.   Yes [provider]  imatinib (GLEEVEC) 100 MG tablet Take 3 tablets (300 mg total) by mouth daily. 03/09/20 04/08/20 Yes Cammie Sickle, MD  traZODone (DESYREL) 50 MG tablet Take 75 mg by mouth at bedtime.  04/09/19 04/08/20 Yes [provider]    Allergies Patient has no known allergies.  No family history on file.  Social History Social History   Tobacco Use  . Smoking status: Former Research scientist (life sciences)  . Smokeless tobacco: Never Used  Substance Use Topics  . Alcohol use: Yes  . Drug use: Not on file    Review of Systems  Constitutional: No fever/chills.  Positive for generalized weakness. Eyes: No visual changes. ENT: No sore throat. Cardiovascular: Denies chest pain. Respiratory: Denies shortness of breath. Gastrointestinal: No abdominal pain.  No nausea, no vomiting.  Positive for diarrhea.  No constipation. Genitourinary: Negative for dysuria.  Positive for cloudy urine.  Musculoskeletal: Negative for back pain. Skin: Negative for rash. Neurological: Negative for headaches, focal weakness or numbness.  ____________________________________________   PHYSICAL EXAM:  VITAL SIGNS: ED Triage Vitals  Enc Vitals Group  BP 03/09/20 1450 (!) 118/86     Pulse Rate 03/09/20 1450 101     Resp --      Temp 03/09/20 1450 98.3 F (36.8 C)     Temp Source 03/09/20 1450 Oral     SpO2 03/09/20 1450 100 %     Weight 03/09/20 1452 103 lb 2.8 oz (46.8 kg)     Height 03/09/20 1452 5\' 7"  (1.702 m)     Head Circumference --      Peak Flow --      Pain Score --      Pain Loc --      Pain Edu? --      Excl. in [place]? --     Constitutional:  Alert and oriented to person and place, but not time. Eyes: Conjunctivae are normal. Head: Atraumatic. Nose: No congestion/rhinnorhea. Mouth/Throat: Mucous membranes are dry. Neck: Normal ROM Cardiovascular: Normal rate, regular rhythm. Grossly normal heart sounds. Respiratory: Normal respiratory effort.  No retractions. Lungs CTAB. Gastrointestinal: Soft and nontender. No distention.  PEG tube in place. Genitourinary: Foley catheter in place with cloudy urine noted. Musculoskeletal: No lower extremity tenderness nor edema. Neurologic:  Normal speech and language. No gross focal neurologic deficits are appreciated. Skin:  Skin is warm, dry and intact. No rash noted. Psychiatric: Mood and affect are normal. Speech and behavior are normal.  ____________________________________________   LABS (all labs ordered are listed, but only abnormal results are displayed)  Labs Reviewed  C DIFFICILE QUICK SCREEN W PCR REFLEX - Abnormal; Notable for the following components:      Result Value   C Diff antigen POSITIVE (*)    C Diff toxin POSITIVE (*)    All other components within normal limits  URINALYSIS, COMPLETE (UACMP) WITH MICROSCOPIC - Abnormal; Notable for the following components:   Color, Urine AMBER (*)    APPearance TURBID (*)    Protein, ur >=300 (*)    Nitrite POSITIVE (*)    Leukocytes,Ua LARGE (*)    RBC / HPF >50 (*)    WBC, UA >50 (*)    Bacteria, UA FEW (*)    All other components within normal limits  CBC WITH DIFFERENTIAL/PLATELET - Abnormal; Notable for the following components:   WBC 17.5 (*)    RBC 2.86 (*)    Hemoglobin 9.7 (*)    HCT 27.7 (*)    Platelets 642 (*)    Neutro Abs 15.3 (*)    Abs Immature Granulocytes 0.09 (*)    All other components within normal limits  COMPREHENSIVE METABOLIC PANEL - Abnormal; Notable for the following components:   Glucose, Bld 102 (*)    BUN 35 (*)    Calcium 8.6 (*)    Total Protein 5.8 (*)    Albumin 2.7 (*)    All  other components within normal limits  GASTROINTESTINAL PANEL BY PCR, STOOL (REPLACES STOOL CULTURE)  SARS CORONAVIRUS 2 BY RT PCR (HOSPITAL ORDER, [place] [facility])  URINE CULTURE  CULTURE, BLOOD (ROUTINE X 2)  CULTURE, BLOOD (ROUTINE X 2)  LACTIC ACID, PLASMA  LACTIC ACID, PLASMA     PROCEDURES  Procedure(s) performed (including Critical Care):  .Critical Care Performed by: Blake Divine, MD Authorized by: Blake Divine, MD   Critical care provider statement:    Critical care time (minutes):  45   Critical care time was exclusive of:  Separately billable procedures and treating other patients and teaching time   Critical care  was necessary to treat or prevent imminent or life-threatening deterioration of the following conditions:  Sepsis   Critical care was time spent personally by me on the following activities:  Discussions with consultants, evaluation of patient's response to treatment, examination of patient, ordering and performing treatments and interventions, ordering and review of laboratory studies, ordering and review of radiographic studies, pulse oximetry, re-evaluation of patient's condition, obtaining history from patient or surrogate and review of old charts   I assumed direction of critical care for this patient from another provider in my specialty: no       ____________________________________________   INITIAL IMPRESSION / ASSESSMENT AND PLAN / ED COURSE       79 year old male with history of hypertension, hyperlipidemia, CML, and CKD presents to the ED complaining of increasing generalized weakness over the past couple of days with cloudy urine and recurrent watery diarrhea.  He clinically appears dehydrated and reportedly has had poor p.o. intake, additionally having frequent watery stools and cloudy urine.  UA concerning for UTI given positive nitrites, we will send for culture and treat with Rocephin, no prior culture data  available.  High suspicion for C. difficile given recurrence of similar symptoms, stool sample was sent and we will start patient on p.o. vancomycin.  Lab work remarkable for leukocytosis, which is consistent with sepsis given his tachycardia.  No evidence of GI bleeding and his renal function is stable.  Case discussed with hospitalist for admission.      ____________________________________________   FINAL CLINICAL IMPRESSION(S) / ED DIAGNOSES  Final diagnoses:  Urinary tract infection associated with indwelling urethral catheter, initial encounter [facility])  Infectious diarrhea     ED Discharge Orders    None       Note:  This document was prepared using Dragon voice recognition software and may include unintentional dictation errors.   Blake Divine, MD 03/09/20 2017

## 2020-03-09 NOTE — ED Triage Notes (Addendum)
Pt ems from [facility] for recurrent UTI. Pt with indwelling foley cath, dark, cloudy urine.

## 2020-03-09 NOTE — H&P (Signed)
History and Physical   Alex Christensen TIW:[id number] DOB: 1941/01/19 DOA: 03/09/2020  Referring MD/NP/PA: Dr. Charna Archer  PCP: Maryland Pink, MD   Outpatient Specialists: None  Patient coming from: Dr. Tasia [facility]atch[facility] oncology  Chief Complaint: Diarrhea  HPI: Alex Christensen is a 79 y.o. male with medical history significant of CML, hypertension, chronic kidney disease stage III, hyperlipidemia who is a resident of  healthcare with indwelling catheter that was brought in by staff secondary to cloudy urine diarrhea as well as clogged Foley catheter.  Patient has been a resident there was poor oral intake.  He has no significant dysphagia but due to malnutrition has a PEG tube in place for feeding.  He has had previous C. difficile colitis and they noticed his diarrhea similar to previous ones.  He is weak and a poor historian.  Debilitated also.  Patient brought to the ER and evaluated.  His urinalysis is consistent with possible UTI and his C. difficile assay is positive for C. difficile diarrhea.  No fever or chills.  He is generally weak.  Patient is therefore being admitted to the hospital with C. difficile colitis recurrent and possibly UTI.  His Foley catheter has been replaced..  ED Course: Temperature 98.3 blood pressure 84/59 pulse 103 respirate of 14 oxygen 98% on room air.  White count 17.5 hemoglobin 9.7 platelets 652.  Sodium 138 potassium 4.0 chloride 102 CO2 26 BUN 35 creatinine 1.1 calcium 8.6.  C. difficile screen is positive.  COVID-19 is negative lactic acid 1.4.  Urinalysis showed turbid urine with large leukocyte positive nitrite.  WBC is more than 50 RBC more than 50 and few bacteria.  Patient being admitted with C. difficile colitis and UTI  Review of Systems: As per HPI otherwise 10 point review of systems negative.    Past Medical History:  Diagnosis Date  . High blood pressure   . Leukemia, chronic myeloid ([place])     Past Surgical History:  Procedure Laterality Date  .  TOOTH EXTRACTION       reports that he has quit smoking. He has never used smokeless tobacco. He reports current alcohol use. No history on file for drug use.  No Known Allergies  No family history on file.   Prior to Admission medications   Medication Sig Start Date End Date Taking? Authorizing Provider  aspirin 325 MG tablet Take 1 tablet (325 mg total) by mouth daily. Stop after 3 months and continue plavix alone 12/23/19 03/22/20 Yes Biby, Massie Kluver, NP  atorvastatin (LIPITOR) 10 MG tablet Take 10 mg by mouth daily.  04/09/19 04/08/20 Yes [provider]  clopidogrel (PLAVIX) 75 MG tablet Take 1 tablet (75 mg total) by mouth daily. 12/24/19  Yes Donzetta Starch, NP  ferrous sulfate 324 MG TBEC Take 324 mg by mouth daily.   Yes [provider]  imatinib (GLEEVEC) 100 MG tablet Take 3 tablets (300 mg total) by mouth daily. 03/09/20 04/08/20 Yes Cammie Sickle, MD  traZODone (DESYREL) 50 MG tablet Take 75 mg by mouth at bedtime.  04/09/19 04/08/20 Yes [provider]    Physical Exam: Vitals:   03/09/20 1530 03/09/20 1730 03/09/20 1800 03/09/20 1830  BP: (!) 132/92 113/69 115/70 126/68  Pulse: 103 92 96 95  Temp:      TempSrc:      SpO2: 100% 99% 100% 100%  Weight:      Height:          Constitutional: Chronically ill looking, cachectic, confused  Vitals:   03/09/20 1530 03/09/20 1730 03/09/20 1800 03/09/20 1830  BP: (!) 132/92 113/69 115/70 126/68  Pulse: 103 92 96 95  Temp:      TempSrc:      SpO2: 100% 99% 100% 100%  Weight:      Height:       Eyes: PERRL, lids and conjunctivae normal ENMT: Mucous membranes are dry. Posterior pharynx clear of any exudate or lesions.Normal dentition.  Neck: normal, supple, no masses, no thyromegaly Respiratory: clear to auscultation bilaterally, no wheezing, no crackles. Normal respiratory effort. No accessory muscle use.  Cardiovascular: Sinus tachycardia, no murmurs / rubs / gallops. No extremity edema. 2+  pedal pulses. No carotid bruits.  Abdomen: no tenderness, no masses palpated. No hepatosplenomegaly. Bowel sounds positive.  PEG tube in place, Musculoskeletal: no clubbing / cyanosis. No joint deformity upper and lower extremities. Good ROM, no contractures. Normal muscle tone.  Skin: no rashes, lesions, ulcers. No induration Neurologic: CN 2-12 grossly intact. Sensation intact, DTR normal. Strength 5/5 in all 4.  Psychiatric: Confused, no agitation, pleasant   Labs on Admission: I have personally reviewed following labs and imaging studies  CBC: Recent Labs  Lab 03/09/20 1543  WBC 17.5*  NEUTROABS 15.3*  HGB 9.7*  HCT 27.7*  MCV 96.9  PLT 242*   Basic Metabolic Panel: Recent Labs  Lab 03/09/20 1543  NA 138  K 4.0  CL 102  CO2 26  GLUCOSE 102*  BUN 35*  CREATININE 1.10  CALCIUM 8.6*   GFR: Estimated Creatinine Clearance: 35.5 mL/min (by C-G formula based on SCr of 1.1 mg/dL). Liver Function Tests: Recent Labs  Lab 03/09/20 1543  AST 22  ALT 28  ALKPHOS 85  BILITOT 0.6  PROT 5.8*  ALBUMIN 2.7*   No results for input(s): LIPASE, AMYLASE in the last 168 hours. No results for input(s): AMMONIA in the last 168 hours. Coagulation Profile: No results for input(s): INR, PROTIME in the last 168 hours. Cardiac Enzymes: No results for input(s): CKTOTAL, CKMB, CKMBINDEX, TROPONINI in the last 168 hours. BNP (last 3 results) No results for input(s): PROBNP in the last 8760 hours. HbA1C: No results for input(s): HGBA1C in the last 72 hours. CBG: No results for input(s): GLUCAP in the last 168 hours. Lipid Profile: No results for input(s): CHOL, HDL, LDLCALC, TRIG, CHOLHDL, LDLDIRECT in the last 72 hours. Thyroid Function Tests: No results for input(s): TSH, T4TOTAL, FREET4, T3FREE, THYROIDAB in the last 72 hours. Anemia Panel: No results for input(s): VITAMINB12, FOLATE, FERRITIN, TIBC, IRON, RETICCTPCT in the last 72 hours. Urine analysis:    Component Value  Date/Time   COLORURINE AMBER (A) 03/09/2020 1516   APPEARANCEUR TURBID (A) 03/09/2020 1516   LABSPEC 1.016 03/09/2020 1516   PHURINE 8.0 03/09/2020 1516   GLUCOSEU NEGATIVE 03/09/2020 1516   HGBUR NEGATIVE 03/09/2020 1516   BILIRUBINUR NEGATIVE 03/09/2020 1516   KETONESUR NEGATIVE 03/09/2020 1516   PROTEINUR >=300 (A) 03/09/2020 1516   NITRITE POSITIVE (A) 03/09/2020 1516   LEUKOCYTESUR LARGE (A) 03/09/2020 1516   Sepsis Labs: @LABRCNTIP (procalcitonin:4,lacticidven:4) ) Recent Results (from the past 240 hour(s))  Gastrointestinal Panel by PCR , Stool     Status: None   Collection Time: 03/09/20  5:02 PM   Specimen: Stool  Result Value Ref Range Status   Campylobacter species NOT DETECTED NOT DETECTED Final   Plesimonas shigelloides NOT DETECTED NOT DETECTED Final   Salmonella species NOT DETECTED NOT DETECTED Final   Yersinia enterocolitica NOT DETECTED NOT DETECTED  Final   Vibrio species NOT DETECTED NOT DETECTED Final   Vibrio cholerae NOT DETECTED NOT DETECTED Final   Enteroaggregative E coli (EAEC) NOT DETECTED NOT DETECTED Final   Enteropathogenic E coli (EPEC) NOT DETECTED NOT DETECTED Final   Enterotoxigenic E coli (ETEC) NOT DETECTED NOT DETECTED Final   Shiga like toxin producing E coli (STEC) NOT DETECTED NOT DETECTED Final   Shigella/Enteroinvasive E coli (EIEC) NOT DETECTED NOT DETECTED Final   Cryptosporidium NOT DETECTED NOT DETECTED Final   Cyclospora cayetanensis NOT DETECTED NOT DETECTED Final   Entamoeba histolytica NOT DETECTED NOT DETECTED Final   Giardia lamblia NOT DETECTED NOT DETECTED Final   Adenovirus F40/41 NOT DETECTED NOT DETECTED Final   Astrovirus NOT DETECTED NOT DETECTED Final   Norovirus GI/GII NOT DETECTED NOT DETECTED Final   Rotavirus A NOT DETECTED NOT DETECTED Final   Sapovirus (I, II, IV, and V) NOT DETECTED NOT DETECTED Final    Comment: Performed at [facility], [address] [place], [place] [address]  C Difficile Quick  Screen w PCR reflex     Status: Abnormal   Collection Time: 03/09/20  5:02 PM   Specimen: Stool  Result Value Ref Range Status   C Diff antigen POSITIVE (A) NEGATIVE Final   C Diff toxin POSITIVE (A) NEGATIVE Final   C Diff interpretation Toxin producing C. difficile detected.  Final    Comment: CRITICAL RESULT CALLED TO, READ BACK BY AND VERIFIED WITH: BILL SMITH 03/09/20 AR 1830 BY AR Performed at [facility], [address] [place], [place] [address]   SARS Coronavirus 2 by RT PCR (hospital order, performed in [facility] hospital lab) Nasopharyngeal Nasopharyngeal Swab     Status: None   Collection Time: 03/09/20  6:37 PM   Specimen: Nasopharyngeal Swab  Result Value Ref Range Status   SARS Coronavirus 2 NEGATIVE NEGATIVE Final    Comment: (NOTE) SARS-CoV-2 target nucleic acids are NOT DETECTED.  The SARS-CoV-2 RNA is generally detectable in upper and lower respiratory specimens during the acute phase of infection. The lowest concentration of SARS-CoV-2 viral copies this assay can detect is 250 copies / mL. A negative result does not preclude SARS-CoV-2 infection and should not be used as the sole basis for treatment or other patient management decisions.  A negative result may occur with improper specimen collection / handling, submission of specimen other than nasopharyngeal swab, presence of viral mutation(s) within the areas targeted by this assay, and inadequate number of viral copies (<250 copies / mL). A negative result must be combined with clinical observations, patient history, and epidemiological information.  Fact Sheet for Patients:   StrictlyIdeas.no  Fact Sheet for Healthcare Providers: BankingDealers.co.za  This test is not yet approved or  cleared by the [place] FDA and has been authorized for detection and/or diagnosis of SARS-CoV-2 by FDA under an Emergency Use Authorization (EUA).  This EUA  will remain in effect (meaning this test can be used) for the duration of the COVID-19 declaration under Section 564(b)(1) of the Act, 21 U.S.C. section 360bbb-3(b)(1), unless the authorization is terminated or revoked sooner.  Performed at [facility], [address] [place],  [address]      Radiological Exams on Admission: No results found.    Assessment/Plan Principal Problem:   C. difficile colitis Active Problems:   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[place])   Essential hypertension   Sepsis secondary to UTI ([place])   Leucocytosis     #1 sepsis:  Secondary to C. difficile colitis and UTI.  Will treat underlying infections and monitor.  #2 C. difficile colitis: Patient has ongoing diarrhea.  Started on oral vancomycin.  IV fluids.  Contact precautions on monitor  #3 UTI: Probably secondary to chronic indwelling catheter.  Empirically start on Rocephin.  Await urine culture sensitivities.  Blood cultures also obtained.  #4 history of CML: Confirm and resume home regimen.  #5 leukocytosis: Secondary to sepsis and causes of sepsis.  #6 essential hypertension: Confirm on resume home regimen.   DVT prophylaxis: Lovenox Code Status: Full code Family Communication: No family at bedside Disposition Plan: [place] called: None Admission status: Inpatient  Severity of Illness: The appropriate patient status for this patient is INPATIENT. Inpatient status is judged to be reasonable and necessary in order to provide the required intensity of service to ensure the patient's safety. The patient's presenting symptoms, physical exam findings, and initial radiographic and laboratory data in the context of their chronic comorbidities is felt to place them at high risk for further clinical deterioration. Furthermore, it is not anticipated that the patient will be medically stable for discharge from the hospital within 2 midnights of admission. The following  factors support the patient status of inpatient.   " The patient's presenting symptoms include persistent diarrhea and evidence of UTI. " The worrisome physical exam findings include cachexia with PEG tube. " The initial radiographic and laboratory data are worrisome because of CT positive and urinalysis diagnostic. " The chronic co-morbidities include CML.   * I certify that at the point of admission it is my clinical judgment that the patient will require inpatient hospital care spanning beyond 2 midnights from the point of admission due to high intensity of service, high risk for further deterioration and high frequency of surveillance required.Barbette Merino MD [facility] Pager [phone]  If 7PM-7AM, please contact night-coverage [email or web address] Password [facility]  03/09/2020, 8:09 PM

## 2020-03-09 NOTE — Progress Notes (Signed)
[facility] Consult Note Telephone: [phone]  Fax: [phone]  PATIENT NAME: Alex Christensen DOB: 08-17-1940 MRN: [id number]  PRIMARY CARE PROVIDER:   Maryland Pink, MD  REFERRING PROVIDER:  Dr [facility] RESPONSIBLE PARTY:Self; wife [name] [phone]  RECOMMENDATIONS and PLAN: 1.ACP:DNR; treat what is treatable  2.Palliative care encounter; Palliative medicine team will continue to support patient, patient's family, and medical team. Visit consisted of counseling and education dealing with the complex and emotionally intense issues of symptom management and palliative care in the setting of serious and potentially life-threatening illness  3. Follow-up when returns from hospitalization  I spent 90 minutes providing this consultation,  Start at 11:30am More than 50% of the time in this consultation was spent coordinating communication.   [name] called prior to Palliative Care visit for further discussion of medical goals of care, update on status in addition to discharge planning. [name] and I talked about wishes to treat what is treatable. We talked about recent events of overall decline. [name] had a lot of questions about [facility] versus long-term care facility. We talked about private caregivers. We talked at length about discharge planning. We talked about [name] current condition. We talked about options and what those scenarios would look like. We talked about [name] requiring more help at home as [name] will be total care. We talked about realistic expectations with current chronic disease progression with acute illness and now UTI was diarrhea on going. We talked about hospice philosophy. We talked about realistic expectations with therapy. We talked about supplemental insurance policy since the 21 days are a for therapy. We talked about role  of palliative care and plan of care. We talked about in-person visit later today with Mr and Mrs Cahalan when arrived at the facility. Ms Kronberg in agreement. Mrs. Servantes endorses she is thankful for Palliative Care discussion. We talked about family dynamics, Mr. Enyeart adult children. Therapeutic listening and emotional support provided. Contact information. Questions answered to satisfaction. [name] endorses she will be at the facility most of the day  HISTORY OF PRESENT ILLNESS:  Alex Christensen is a 79 y.o. year old male with multiple medical problems including CML, TIA left brain s/p TPA, HTN, chronic kidney disease stage Illa, acoustic neuroma ritis, hyperlipidemia, anemia. Hospitalized 5/6 / 2021 to 5 / 12 / 2021 TIA left brain s/p TPA. [name] continues to reside in [place] at [facility]. [name] continues to overall decline clinically her staff. Staff endorses Mr Andal been more week and over the last week has developed a urinary tract infection currently being treated with antibiotics and diarrhea. [name] has had a history of C difficile. Staff endorses the diarrhea does have an odor. Staff endorses [name] does remain a deal dependent with in comments. [name] does require assistance with turning and positioning. Therapy has been trying to work with Mr Aragones although with increasing weakness, diarrhea and UTI he has become more debilitated. Appetite remains declined as he eats less than 25% of his meals and not every meal. [name] does continue to get tube feedings through the night supplemental. [name] does continue to come to the facility daily. A present Mr. Dupuis is lying in bed, appears ill, pale and weak. Mrs Dulay is at bedside. We talked about purpose of Palliative Care visit, Mrs Strickling in agreement. We talked about how  Mr Mel Almond has been feeling. We talked about UTI, diarrhea ongoing with weakness. We talked about [name]  appearing ill, pale at this time. Mrs Cogbill endorses her wishes are for him to be transferred to the emergency department for further evaluation and stool transplant. We talked about medical goals of care to treat what is tradable. We talked about updating nursing. Discuss with [name] to request Palliative consult during hospitalization for ongoing support and will notify [facility] also as pending your visit. [name] endorses she is thankful for palliative care visit and discussion. Therapeutic listening and emotional support provided. Questions answered to satisfaction. Contact information provided. I have updated nursing staff.  Palliative Care was asked to help to continue to address goals of care.   CODE STATUS: DNR  PPS: 30% HOSPICE ELIGIBILITY/DIAGNOSIS: TBD  PAST MEDICAL HISTORY:  Past Medical History:  Diagnosis Date  . High blood pressure   . 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 Smoking status: Former Research scientist (life sciences)  . Smokeless tobacco: Never Used  Substance Use Topics  . Alcohol use: Yes    ALLERGIES: No Known Allergies   PERTINENT MEDICATIONS:  Facility-Administered Encounter Medications as of 03/09/2020  Medication  . [COMPLETED] cefTRIAXone (ROCEPHIN) 1 g in sodium chloride 0.9 % 100 mL IVPB  . [COMPLETED] lactated ringers bolus 1,000 mL  . [COMPLETED] lactated ringers bolus 1,000 mL  . vancomycin (VANCOCIN) 50 mg/mL oral solution 125 mg   Followed by  . [START ON 03/23/2020] vancomycin (VANCOCIN) 50 mg/mL oral solution 125 mg   Followed by  . [START ON 03/31/2020] vancomycin (VANCOCIN) 50 mg/mL oral solution 125 mg   Followed by  . [START ON 04/07/2020] vancomycin (VANCOCIN) 50 mg/mL oral solution 125 mg   Followed by  . [START ON 05/05/2020] vancomycin (VANCOCIN) 50 mg/mL oral solution 125 mg   Outpatient Encounter Medications as of 03/09/2020  Medication Sig  . aspirin 325 MG tablet Take 1 tablet (325 mg total)  by mouth daily. Stop after 3 months and continue plavix alone  . atorvastatin (LIPITOR) 10 MG tablet Take 10 mg by mouth daily.   . clopidogrel (PLAVIX) 75 MG tablet Take 1 tablet (75 mg total) by mouth daily.  . ferrous sulfate 324 MG TBEC Take 324 mg by mouth daily.  Marland Kitchen imatinib (GLEEVEC) 100 MG tablet Take 3 tablets (300 mg total) by mouth daily.  . traZODone (DESYREL) 50 MG tablet Take 75 mg by mouth at bedtime.     PHYSICAL EXAM:   General: chronically ill, pale, severely weak, debilitated, male Cardiovascular: regular rate and rhythm Pulmonary: poor air movement Neurological: generalized weakness  Joanell Cressler Ihor Gully, NP

## 2020-03-10 LAB — COMPREHENSIVE METABOLIC PANEL
ALT: 23 U/L (ref 0–44)
AST: 20 U/L (ref 15–41)
Albumin: 2.2 g/dL — ABNORMAL LOW (ref 3.5–5.0)
Alkaline Phosphatase: 66 U/L (ref 38–126)
Anion gap: 7 (ref 5–15)
BUN: 25 mg/dL — ABNORMAL HIGH (ref 8–23)
CO2: 26 mmol/L (ref 22–32)
Calcium: 7.8 mg/dL — ABNORMAL LOW (ref 8.9–10.3)
Chloride: 105 mmol/L (ref 98–111)
Creatinine, Ser: 0.91 mg/dL (ref 0.61–1.24)
GFR calc Af Amer: >60 mL/min (ref >60)
GFR calc non Af Amer: >60 mL/min (ref >60)
Glucose, Bld: 124 mg/dL — ABNORMAL HIGH (ref 70–99)
Potassium: 3.5 mmol/L (ref 3.5–5.1)
Sodium: 138 mmol/L (ref 135–145)
Total Bilirubin: 0.5 mg/dL (ref 0.3–1.2)
Total Protein: 4.6 g/dL — ABNORMAL LOW (ref 6.5–8.1)

## 2020-03-10 LAB — CBC
HCT: 23.4 % — ABNORMAL LOW (ref 39.0–52.0)
Hemoglobin: 8.3 g/dL — ABNORMAL LOW (ref 13.0–17.0)
MCH: 33.9 pg (ref 26.0–34.0)
MCHC: 35.5 g/dL (ref 30.0–36.0)
MCV: 95.5 fL (ref 80.0–100.0)
Platelets: 538 10*3/uL — ABNORMAL HIGH (ref 150–400)
RBC: 2.45 MIL/uL — ABNORMAL LOW (ref 4.22–5.81)
RDW: 15.2 % (ref 11.5–15.5)
WBC: 11.5 10*3/uL — ABNORMAL HIGH (ref 4.0–10.5)
nRBC: 0 % (ref 0.0–0.2)

## 2020-03-10 LAB — MAGNESIUM: Magnesium: 2.2 mg/dL (ref 1.7–2.4)

## 2020-03-10 LAB — LACTIC ACID, PLASMA: Lactic Acid, Venous: 1.1 mmol/L (ref 0.5–1.9)

## 2020-03-10 MED ORDER — MAGNESIUM SULFATE IN D5W 1-5 GM/100ML-% IV SOLN
1.0000 g | Freq: Once | INTRAVENOUS | Status: AC
  Administered 2020-03-10: 1 g via INTRAVENOUS
  Filled 2020-03-10: qty 100

## 2020-03-10 MED ORDER — CHLORHEXIDINE GLUCONATE CLOTH 2 % EX PADS
6.0000 | MEDICATED_PAD | Freq: Every day | CUTANEOUS | Status: DC
  Administered 2020-03-10 – 2020-03-15 (×5): 6 via TOPICAL

## 2020-03-10 MED ORDER — SODIUM CHLORIDE 0.9 % IV BOLUS
250.0000 mL | Freq: Once | INTRAVENOUS | Status: AC
  Administered 2020-03-10: 250 mL via INTRAVENOUS

## 2020-03-10 MED ORDER — SODIUM CHLORIDE 0.9 % IV SOLN: INTRAVENOUS | Status: DC

## 2020-03-10 NOTE — ED Notes (Signed)
Pt provided lunch tray, assisted with eating

## 2020-03-10 NOTE — ED Notes (Signed)
Pt cleaned and changed and readjusted in bed.

## 2020-03-10 NOTE — Progress Notes (Signed)
PROGRESS NOTE    Alex Christensen  JQB:[id number] DOB: 10-19-1940 DOA: 03/09/2020 PCP: Maryland Pink, MD   Brief Narrative:  HPI: Alex Christensen is a 79 y.o. male with medical history significant of CML, hypertension, chronic kidney disease stage III, hyperlipidemia who is a resident of [facility] with indwelling catheter that was brought in by staff secondary to cloudy urine diarrhea as well as clogged Foley catheter.  Patient has been a resident there was poor oral intake.  He has no significant dysphagia but due to malnutrition has a PEG tube in place for feeding.  He has had previous C. difficile colitis and they noticed his diarrhea similar to previous ones.  He is weak and a poor historian.  Debilitated also.  Patient brought to the ER and evaluated.  His urinalysis is consistent with possible UTI and his C. difficile assay is positive for C. difficile diarrhea.  No fever or chills.  He is generally weak.  Patient is therefore being admitted to the hospital with C. difficile colitis recurrent and possibly UTI.  His Foley catheter has been replaced.  7/29: Patient seen and examined in the emergency room.  Reports symptomatic improvement since admission.  No fevers noted since admission.  Foley catheter replaced on admission.   Assessment & Plan:   Principal Problem:   C. 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 difficile colitis Acute infectious diarrhea Urinary tract infection Sepsis secondary to above Patient with history of recurrent C. difficile.  He will be candidate for long-term vancomycin taper.  If he does not respond to therapy will involve infectious disease.  Currently getting p.o. vancomycin and intravenous fluids.  We will continue.  Rocephin 1 g every 24 hours for UTI.  Follow urine culture and sensitivities.  CML Patient on [place].  Will hold while dealing  with acute infection  Essential hypertension Does not appear to be on any blood pressure control agents  Coronary disease Plavix 75 mg daily Aspirin 325 mg daily  Hyperlipidemia Lipitor 10 mg daily  Iron deficiency anemia Continue ferrous sulfate 325 mg daily  DVT prophylaxis: Lovenox Code Status: Full Family Communication: None today Disposition Plan: Status is: Inpatient  Remains inpatient appropriate because:Inpatient level of care appropriate due to severity of illness   Dispo: The patient is from: Home              Anticipated d/c is to: SNF              Anticipated d/c date is: 3 days              Patient currently is not medically stable to d/c.  Admitted for recurrent C. difficile colitis.  On p.o. vancomycin       Consultants:   None  Procedures:   None  Antimicrobials:  Vancomycin p.o.   Rocephin   Subjective: Patient seen and examined.  Complains of diarrhea abdominal pain.  Improving over interval  Objective: Vitals:   03/10/20 0730 03/10/20 1200 03/10/20 1430 03/10/20 1500  BP: 119/72 (!) 138/83 (!) 140/76 (!) 136/77  Pulse: 85 88 86 81  Resp:  16    Temp:      TempSrc:      SpO2: 100% 100% 100% 100%  Weight:      Height:        Intake/Output Summary (Last 24 hours) at 03/10/2020 1527 Last data  filed at 03/10/2020 1222 Gross per 24 hour  Intake --  Output 600 ml  Net -600 ml   Filed Weights   03/09/20 1452  Weight: 46.8 kg    Examination:  General: No apparent distress, patient appears well HEENT: Normocephalic, atraumatic Neck, supple, trachea midline, no tenderness Heart: Regular rate and rhythm, S1/S2 normal, no murmurs Lungs: Clear to auscultation bilaterally, no adventitious sounds, normal work of breathing Abdomen: Soft, nondistended, tender to palpation epigastrium, hypoactive bowel sounds Extremities: Normal, atraumatic, no clubbing or cyanosis, normal muscle tone Skin: No rashes or lesions, normal  color Neurologic: Cranial nerves grossly intact, sensation intact, alert and oriented x3 Psychiatric: Normal affect    Data Reviewed: I have personally reviewed following labs and imaging studies  CBC: Recent Labs  Lab 03/09/20 1543 03/10/20 0503  WBC 17.5* 11.5*  NEUTROABS 15.3*  --   HGB 9.7* 8.3*  HCT 27.7* 23.4*  MCV 96.9 95.5  PLT 642* 768*   Basic Metabolic Panel: Recent Labs  Lab 03/09/20 1543 03/10/20 0503  NA 138 138  K 4.0 3.5  CL 102 105  CO2 26 26  GLUCOSE 102* 124*  BUN 35* 25*  CREATININE 1.10 0.91  CALCIUM 8.6* 7.8*  MG  --  2.2   GFR: Estimated Creatinine Clearance: 42.9 mL/min (by C-G formula based on SCr of 0.91 mg/dL). Liver Function Tests: Recent Labs  Lab 03/09/20 1543 03/10/20 0503  AST 22 20  ALT 28 23  ALKPHOS 85 66  BILITOT 0.6 0.5  PROT 5.8* 4.6*  ALBUMIN 2.7* 2.2*   No results for input(s): LIPASE, AMYLASE in the last 168 hours. No results for input(s): AMMONIA in the last 168 hours. Coagulation Profile: No results for input(s): INR, PROTIME in the last 168 hours. Cardiac Enzymes: No results for input(s): CKTOTAL, CKMB, CKMBINDEX, TROPONINI in the last 168 hours. BNP (last 3 results) No results for input(s): PROBNP in the last 8760 hours. HbA1C: No results for input(s): HGBA1C in the last 72 hours. CBG: No results for input(s): GLUCAP in the last 168 hours. Lipid Profile: No results for input(s): CHOL, HDL, LDLCALC, TRIG, CHOLHDL, LDLDIRECT in the last 72 hours. Thyroid Function Tests: No results for input(s): TSH, T4TOTAL, FREET4, T3FREE, THYROIDAB in the last 72 hours. Anemia Panel: No results for input(s): VITAMINB12, FOLATE, FERRITIN, TIBC, IRON, RETICCTPCT in the last 72 hours. Sepsis Labs: Recent Labs  Lab 03/09/20 2207 03/10/20 0503  LATICACIDVEN 1.4 1.1    Recent Results (from the past 240 hour(s))  Gastrointestinal Panel by PCR , Stool     Status: None   Collection Time: 03/09/20  5:02 PM   Specimen:  Stool  Result Value Ref Range Status   Campylobacter species NOT DETECTED NOT DETECTED Final   Plesimonas shigelloides NOT DETECTED NOT DETECTED Final   Salmonella species NOT DETECTED NOT DETECTED Final   Yersinia enterocolitica NOT DETECTED NOT DETECTED Final   Vibrio species NOT DETECTED NOT DETECTED Final   Vibrio cholerae NOT DETECTED NOT DETECTED Final   Enteroaggregative E coli (EAEC) NOT DETECTED NOT DETECTED Final   Enteropathogenic E coli (EPEC) NOT DETECTED NOT DETECTED Final   Enterotoxigenic E coli (ETEC) NOT DETECTED NOT DETECTED Final   Shiga like toxin producing E coli (STEC) NOT DETECTED NOT DETECTED Final   Shigella/Enteroinvasive E coli (EIEC) NOT DETECTED NOT DETECTED Final   Cryptosporidium NOT DETECTED NOT DETECTED Final   Cyclospora cayetanensis NOT DETECTED NOT DETECTED Final   Entamoeba histolytica NOT DETECTED NOT DETECTED Final  Giardia lamblia NOT DETECTED NOT DETECTED Final   Adenovirus F40/41 NOT DETECTED NOT DETECTED Final   Astrovirus NOT DETECTED NOT DETECTED Final   Norovirus GI/GII NOT DETECTED NOT DETECTED Final   Rotavirus A NOT DETECTED NOT DETECTED Final   Sapovirus (I, II, IV, and V) NOT DETECTED NOT DETECTED Final    Comment: Performed at [facility], [address] [place], [place] [address]  C Difficile Quick Screen w PCR reflex     Status: Abnormal   Collection Time: 03/09/20  5:02 PM   Specimen: Stool  Result Value Ref Range Status   C Diff antigen POSITIVE (A) NEGATIVE Final   C Diff toxin POSITIVE (A) NEGATIVE Final   C Diff interpretation Toxin producing C. difficile detected.  Final    Comment: CRITICAL RESULT CALLED TO, READ BACK BY AND VERIFIED WITH: BILL SMITH 03/09/20 AR 1830 BY AR Performed at [facility], [address] [place], [place] [address]   Culture, blood (routine x 2)     Status: None (Preliminary result)   Collection Time: 03/09/20  6:34 PM   Specimen: BLOOD  Result Value Ref Range Status    Specimen Description BLOOD BLOOD RIGHT FOREARM  Final   Special Requests   Final    BOTTLES DRAWN AEROBIC AND ANAEROBIC Blood Culture results may not be optimal due to an inadequate volume of blood received in culture bottles   Culture   Final    NO GROWTH < 24 HOURS Performed at [facility], [address] [place], [place] [address]    Report Status PENDING  Incomplete  SARS Coronavirus 2 by RT PCR (hospital order, performed in [facility] hospital lab) Nasopharyngeal Nasopharyngeal Swab     Status: None   Collection Time: 03/09/20  6:37 PM   Specimen: Nasopharyngeal Swab  Result Value Ref Range Status   SARS Coronavirus 2 NEGATIVE NEGATIVE Final    Comment: (NOTE) SARS-CoV-2 target nucleic acids are NOT DETECTED.  The SARS-CoV-2 RNA is generally detectable in upper and lower respiratory specimens during the acute phase of infection. The lowest concentration of SARS-CoV-2 viral copies this assay can detect is 250 copies / mL. A negative result does not preclude SARS-CoV-2 infection and should not be used as the sole basis for treatment or other patient management decisions.  A negative result may occur with improper specimen collection / handling, submission of specimen other than nasopharyngeal swab, presence of viral mutation(s) within the areas targeted by this assay, and inadequate number of viral copies (<250 copies / mL). A negative result must be combined with clinical observations, patient history, and epidemiological information.  Fact Sheet for Patients:   StrictlyIdeas.no  Fact Sheet for Healthcare Providers: BankingDealers.co.za  This test is not yet approved or  cleared by the [place] FDA and has been authorized for detection and/or diagnosis of SARS-CoV-2 by FDA under an Emergency Use Authorization (EUA).  This EUA will remain in effect (meaning this test can be used) for the duration of the COVID-19  declaration under Section 564(b)(1) of the Act, 21 U.S.C. section 360bbb-3(b)(1), unless the authorization is terminated or revoked sooner.  Performed at [facility], [address] [place], [place] [address]   Culture, blood (routine x 2)     Status: None (Preliminary result)   Collection Time: 03/09/20 10:02 PM   Specimen: BLOOD  Result Value Ref Range Status   Specimen Description BLOOD RAC  Final   Special Requests BOTTLES DRAWN AEROBIC  AND ANAEROBIC BCAV  Final   Culture   Final    NO GROWTH < 12 HOURS Performed at [facility], [address] [place], [place] [address]    Report Status PENDING  Incomplete         Radiology Studies: No results found.      Scheduled Meds: . aspirin EC  325 mg Oral Daily  . atorvastatin  10 mg Oral Daily  . clopidogrel  75 mg Oral Daily  . ferrous sulfate  324 mg Oral Daily  . imatinib  300 mg Oral Daily  . traZODone  75 mg Oral QHS  . vancomycin  125 mg Oral QID   Followed by  . [START ON 03/23/2020] vancomycin  125 mg Oral BID   Followed by  . [START ON 03/31/2020] vancomycin  125 mg Oral Daily   Followed by  . [START ON 04/07/2020] vancomycin  125 mg Oral QODAY   Followed by  . [START ON 05/05/2020] vancomycin  125 mg Oral Q3 days   Continuous Infusions: . cefTRIAXone (ROCEPHIN)  IV       LOS: 1 day    Time spent: 25 minutes    Sidney Ace, MD [facility]   If 7PM-7AM, please contact night-coverage  03/10/2020, 3:27 PM

## 2020-03-10 NOTE — ED Notes (Signed)
Pt cleaned and changed. Cream applied to reddened areas. Pt readjusted in bed.

## 2020-03-10 NOTE — ED Notes (Signed)
Attempted to call report to floor, RN to call back with questions 

## 2020-03-10 NOTE — ED Notes (Signed)
Pt resting at this time, RR even and unlabored. Call bell within reach, stretcher locked in lowest position

## 2020-03-10 NOTE — ED Notes (Signed)
Patient is resting comfortably. 

## 2020-03-10 NOTE — ED Notes (Signed)
Pt resting at this time with family at bedside.

## 2020-03-10 NOTE — ED Notes (Signed)
Pt has small amount of diarrhea in brief. PT changed, wiped off and clean brief given. Barrier cream applied to bottom

## 2020-03-10 NOTE — Care Management Important Message (Signed)
Important Message  Patient Details  Name: Alex Christensen MRN: [id number] Date of Birth: 10/27/1940   Medicare Important Message Given:  N/A - LOS <3 / Initial given by admissions     Dannette Barbara 03/10/2020, 6:36 PM

## 2020-03-10 NOTE — Progress Notes (Signed)
[facility] Liaison note: Patient is currently followed by [employer] community Palliative NP at [facility]. TOC Doran Clay made aware.  Flo Shanks BSN, RN, [employer] [phone]

## 2020-03-10 NOTE — ED Notes (Signed)
Verbal order to d/c D5 NS Dr Priscella Mann

## 2020-03-10 NOTE — ED Notes (Signed)
Pt had a run of v tach. Pt is in a normal sinus rhythm now. MD notified.

## 2020-03-11 ENCOUNTER — Encounter: Payer: Self-pay | Admitting: Internal Medicine

## 2020-03-11 DIAGNOSIS — L8990 Pressure ulcer of unspecified site, unspecified stage: Secondary | ICD-10-CM | POA: Insufficient documentation

## 2020-03-11 MED ORDER — OSMOLITE 1.2 CAL PO LIQD: 1000.0000 mL | ORAL | Status: DC

## 2020-03-11 MED ORDER — FREE WATER
100.0000 mL | Freq: Four times a day (QID) | Status: DC
  Administered 2020-03-11 – 2020-03-15 (×9): 100 mL

## 2020-03-11 MED ORDER — OSMOLITE 1.5 CAL PO LIQD
700.0000 mL | ORAL | Status: DC
  Administered 2020-03-11 – 2020-03-12 (×2): 700 mL

## 2020-03-11 MED ORDER — ADULT MULTIVITAMIN W/MINERALS CH
1.0000 | ORAL_TABLET | Freq: Every day | ORAL | Status: DC
  Administered 2020-03-12 – 2020-03-15 (×4): 1 via ORAL
  Filled 2020-03-11 (×4): qty 1

## 2020-03-11 NOTE — Progress Notes (Signed)
PROGRESS NOTE    Alex Christensen  VFI:[id number] DOB: 04-04-1941 DOA: 03/09/2020 PCP: [place] Pink, MD   Brief Narrative:  HPI: Alex Christensen is a 79 y.o. male with medical history significant of CML, hypertension, chronic kidney disease stage III, hyperlipidemia who is a resident of [facility] with indwelling catheter that was brought in by staff secondary to cloudy urine diarrhea as well as clogged Foley catheter.  Patient has been a resident there was poor oral intake.  He has no significant dysphagia but due to malnutrition has a PEG tube in place for feeding.  He has had previous C. difficile colitis and they noticed his diarrhea similar to previous ones.  He is weak and a poor historian.  Debilitated also.  Patient brought to the ER and evaluated.  His urinalysis is consistent with possible UTI and his C. difficile assay is positive for C. difficile diarrhea.  No fever or chills.  He is generally weak.  Patient is therefore being admitted to the hospital with C. difficile colitis recurrent and possibly UTI.  His Foley catheter has been replaced.  7/29: Patient seen and examined in the emergency room.  Reports symptomatic improvement since admission.  No fevers noted since admission.  Foley catheter replaced on admission.  7/30: Patient seen and examined.  No decompensating events overnight.  Per patient's report his diarrhea has improved.  Seems to be tolerating p.o.   Assessment & Plan:   Principal Problem:   C. 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 difficile colitis Acute infectious diarrhea Urinary tract infection Sepsis secondary to above, improved Diarrhea seems to be improving Patient tolerating p.o. Bowel sounds less hyperactive Plan: Continue vancomycin, plans for long-term taper Supplemental intravenous fluids  CML Patient  on [place].  Will hold while dealing with acute infection  Essential hypertension Does not appear to be on any blood pressure control agents BP controlled over interval  History of CVA Plavix 75 mg daily Aspirin 325 mg daily  Hyperlipidemia Lipitor 10 mg daily  Iron deficiency anemia Continue ferrous sulfate 325 mg daily  DVT prophylaxis: Lovenox Code Status: Full Family Communication: Wife Baltasar Twilley [phone]  Disposition Plan: Status is: Inpatient  Remains inpatient appropriate because:Inpatient level of care appropriate due to severity of illness   Dispo: The patient is from: Home              Anticipated d/c is to: SNF              Anticipated d/c date is: 3 days              Patient currently is not medically stable to d/c.  Admitted for recurrent C. difficile colitis.  On p.o. vancomycin     Consultants:   None  Procedures:   None  Antimicrobials:  Vancomycin p.o.   Rocephin   Subjective: Patient seen and examined.  Complains of diarrhea abdominal pain.  Improving over interval  Objective: Vitals:   03/10/20 2025 03/10/20 2325 03/11/20 0503 03/11/20 1006  BP: (!) 131/70 (!) 112/63 96/80 (!) 116/64  Pulse: 82 91 99 90  Resp: 20 18 20 18   Temp: 97.9 F (36.6 C) 97.7 F (36.5 C) 97.9 F (36.6 C) (!) 97.4 F (36.3 C)  TempSrc: Oral Oral Oral Oral  SpO2: 100% 100% 100% 99%  Weight:  Height:        Intake/Output Summary (Last 24 hours) at 03/11/2020 1131 Last data filed at 03/11/2020 0830 Gross per 24 hour  Intake 1014.24 ml  Output 1500 ml  Net -485.76 ml   Filed Weights   03/09/20 1452 03/10/20 1639  Weight: 46.8 kg (!) 42.6 kg    Examination:  General: No apparent distress, patient appears well HEENT: Normocephalic, atraumatic Neck, supple, trachea midline, no tenderness Heart: Regular rate and rhythm, S1/S2 normal, no murmurs Lungs: Clear to auscultation bilaterally, no adventitious sounds, normal work of  breathing Abdomen: Soft, nondistended, tender to palpation epigastrium, hyperactive bowel sounds Extremities: Normal, atraumatic, no clubbing or cyanosis, normal muscle tone Skin: No rashes or lesions, normal color Neurologic: Cranial nerves grossly intact, sensation intact, alert and oriented x3 Psychiatric: Normal affect    Data Reviewed: I have personally reviewed following labs and imaging studies  CBC: Recent Labs  Lab 03/09/20 1543 03/10/20 0503  WBC 17.5* 11.5*  NEUTROABS 15.3*  --   HGB 9.7* 8.3*  HCT 27.7* 23.4*  MCV 96.9 95.5  PLT 642* 626*   Basic Metabolic Panel: Recent Labs  Lab 03/09/20 1543 03/10/20 0503  NA 138 138  K 4.0 3.5  CL 102 105  CO2 26 26  GLUCOSE 102* 124*  BUN 35* 25*  CREATININE 1.10 0.91  CALCIUM 8.6* 7.8*  MG  --  2.2   GFR: Estimated Creatinine Clearance: 39 mL/min (by C-G formula based on SCr of 0.91 mg/dL). Liver Function Tests: Recent Labs  Lab 03/09/20 1543 03/10/20 0503  AST 22 20  ALT 28 23  ALKPHOS 85 66  BILITOT 0.6 0.5  PROT 5.8* 4.6*  ALBUMIN 2.7* 2.2*   No results for input(s): LIPASE, AMYLASE in the last 168 hours. No results for input(s): AMMONIA in the last 168 hours. Coagulation Profile: No results for input(s): INR, PROTIME in the last 168 hours. Cardiac Enzymes: No results for input(s): CKTOTAL, CKMB, CKMBINDEX, TROPONINI in the last 168 hours. BNP (last 3 results) No results for input(s): PROBNP in the last 8760 hours. HbA1C: No results for input(s): HGBA1C in the last 72 hours. CBG: No results for input(s): GLUCAP in the last 168 hours. Lipid Profile: No results for input(s): CHOL, HDL, LDLCALC, TRIG, CHOLHDL, LDLDIRECT in the last 72 hours. Thyroid Function Tests: No results for input(s): TSH, T4TOTAL, FREET4, T3FREE, THYROIDAB in the last 72 hours. Anemia Panel: No results for input(s): VITAMINB12, FOLATE, FERRITIN, TIBC, IRON, RETICCTPCT in the last 72 hours. Sepsis Labs: Recent Labs  Lab  03/09/20 2207 03/10/20 0503  LATICACIDVEN 1.4 1.1    Recent Results (from the past 240 hour(s))  Urine culture     Status: Abnormal (Preliminary result)   Collection Time: 03/09/20  5:02 PM   Specimen: Urine, Random  Result Value Ref Range Status   Specimen Description   Final    URINE, RANDOM Performed at [facility], [address], [place], [place] [address]    Special Requests   Final    NONE Performed at [facility], [address] [place], [place] [address]    Culture (A)  Final    >=100,000 COLONIES/mL PROTEUS MIRABILIS SUSCEPTIBILITIES TO FOLLOW Performed at [facility], [place] [address] [place], [place] [address]    Report Status PENDING  Incomplete  Gastrointestinal Panel by PCR , Stool     Status: None   Collection Time: 03/09/20  5:02 PM   Specimen: Stool  Result Value Ref  Range Status   Campylobacter species NOT DETECTED NOT DETECTED Final   Plesimonas shigelloides NOT DETECTED NOT DETECTED Final   Salmonella species NOT DETECTED NOT DETECTED Final   Yersinia enterocolitica NOT DETECTED NOT DETECTED Final   Vibrio species NOT DETECTED NOT DETECTED Final   Vibrio cholerae NOT DETECTED NOT DETECTED Final   Enteroaggregative E coli (EAEC) NOT DETECTED NOT DETECTED Final   Enteropathogenic E coli (EPEC) NOT DETECTED NOT DETECTED Final   Enterotoxigenic E coli (ETEC) NOT DETECTED NOT DETECTED Final   Shiga like toxin producing E coli (STEC) NOT DETECTED NOT DETECTED Final   Shigella/Enteroinvasive E coli (EIEC) NOT DETECTED NOT DETECTED Final   Cryptosporidium NOT DETECTED NOT DETECTED Final   Cyclospora cayetanensis NOT DETECTED NOT DETECTED Final   Entamoeba histolytica NOT DETECTED NOT DETECTED Final   Giardia lamblia NOT DETECTED NOT DETECTED Final   Adenovirus F40/41 NOT DETECTED NOT DETECTED Final   Astrovirus NOT DETECTED NOT DETECTED Final   Norovirus GI/GII NOT DETECTED NOT DETECTED Final   Rotavirus A NOT DETECTED NOT  DETECTED Final   Sapovirus (I, II, IV, and V) NOT DETECTED NOT DETECTED Final    Comment: Performed at [facility], [address] [place], [place] [address]  C Difficile Quick Screen w PCR reflex     Status: Abnormal   Collection Time: 03/09/20  5:02 PM   Specimen: Stool  Result Value Ref Range Status   C Diff antigen POSITIVE (A) NEGATIVE Final   C Diff toxin POSITIVE (A) NEGATIVE Final   C Diff interpretation Toxin producing C. difficile detected.  Final    Comment: CRITICAL RESULT CALLED TO, READ BACK BY AND VERIFIED WITH: BILL SMITH 03/09/20 AR 1830 BY AR Performed at [facility], [address] [place], [place] [address]   Culture, blood (routine x 2)     Status: None (Preliminary result)   Collection Time: 03/09/20  6:34 PM   Specimen: BLOOD  Result Value Ref Range Status   Specimen Description BLOOD BLOOD RIGHT FOREARM  Final   Special Requests   Final    BOTTLES DRAWN AEROBIC AND ANAEROBIC Blood Culture results may not be optimal due to an inadequate volume of blood received in culture bottles   Culture   Final    NO GROWTH 2 DAYS Performed at [facility], [address] [place], [place] [address]    Report Status PENDING  Incomplete  SARS Coronavirus 2 by RT PCR (hospital order, performed in [place] hospital lab) Nasopharyngeal Nasopharyngeal Swab     Status: None   Collection Time: 03/09/20  6:37 PM   Specimen: Nasopharyngeal Swab  Result Value Ref Range Status   SARS Coronavirus 2 NEGATIVE NEGATIVE Final    Comment: (NOTE) SARS-CoV-2 target nucleic acids are NOT DETECTED.  The SARS-CoV-2 RNA is generally detectable in upper and lower respiratory specimens during the acute phase of infection. The lowest concentration of SARS-CoV-2 viral copies this assay can detect is 250 copies / mL. A negative result does not preclude SARS-CoV-2 infection and should not be used as the sole basis for treatment or other patient management  decisions.  A negative result may occur with improper specimen collection / handling, submission of specimen other than nasopharyngeal swab, presence of viral mutation(s) within the areas targeted by this assay, and inadequate number of viral copies (<250 copies / mL). A negative result must be combined with clinical observations, patient history, and epidemiological information.  Fact Sheet for Patients:  StrictlyIdeas.no  Fact Sheet for Healthcare Providers: BankingDealers.co.za  This test is not yet approved or  cleared by the [place] FDA and has been authorized for detection and/or diagnosis of SARS-CoV-2 by FDA under an Emergency Use Authorization (EUA).  This EUA will remain in effect (meaning this test can be used) for the duration of the COVID-19 declaration under Section 564(b)(1) of the Act, 21 U.S.C. section 360bbb-3(b)(1), unless the authorization is terminated or revoked sooner.  Performed at [facility], [address] [place], [place] [address]   Culture, blood (routine x 2)     Status: None (Preliminary result)   Collection Time: 03/09/20 10:02 PM   Specimen: BLOOD  Result Value Ref Range Status   Specimen Description BLOOD RAC  Final   Special Requests BOTTLES DRAWN AEROBIC AND ANAEROBIC BCAV  Final   Culture   Final    NO GROWTH 2 DAYS Performed at [facility], [address] [place], [place] [address]    Report Status PENDING  Incomplete         Radiology Studies: No results found.      Scheduled Meds: . aspirin EC  325 mg Oral Daily  . atorvastatin  10 mg Oral Daily  . Chlorhexidine Gluconate Cloth  6 each Topical Daily  . clopidogrel  75 mg Oral Daily  . ferrous sulfate  324 mg Oral Daily  . traZODone  75 mg Oral QHS  . vancomycin  125 mg Oral QID   Followed by  . [START ON 03/23/2020] vancomycin  125 mg Oral BID   Followed by  . [START ON 03/31/2020] vancomycin   125 mg Oral Daily   Followed by  . [START ON 04/07/2020] vancomycin  125 mg Oral QODAY   Followed by  . [START ON 05/05/2020] vancomycin  125 mg Oral Q3 days   Continuous Infusions: . sodium chloride 75 mL/hr at 03/11/20 0927  . cefTRIAXone (ROCEPHIN)  IV 1 g (03/10/20 1856)     LOS: 2 days    Time spent: 25 minutes    Sidney Ace, MD [facility]   If 7PM-7AM, please contact night-coverage  03/11/2020, 11:31 AM

## 2020-03-11 NOTE — Evaluation (Signed)
Physical Therapy Evaluation Patient Details Name: Alex Christensen MRN: [id number] DOB: [birth year] Today's Date: 03/11/2020   History of Present Illness  Pt is an 79 y.o. male with medical history significant of CML, CVA, hypertension, chronic kidney disease stage III, and hyperlipidemia with indwelling catheter and PEG tube for feeding. Pt was brought in from SNF secondary to cloudy urine diarrhea as well as clogged Foley catheter.  MD assessment includes: C-diff, UTI, sepsis, HTN, and iron deficiency anemia.    Clinical Impression  Pt somewhat confused and disoriented, unable to provide history but was able to follow 1-step commands well and overall put forth good effort during the session.  Pt required Min assistance with all functional tasks as well as to prevent posterior LOB in sitting at the EOB.  Pt was only able to remain in standing 10-15 seconds during each of three standing sessions and was only able to take 2-3 very small shuffling steps at the EOB before fatiguing and needing to return to sitting.  Pt reported no adverse symptoms during the session.  Pt will benefit from PT services in a SNF setting upon discharge to safely address deficits listed in patient problem list for decreased caregiver assistance and eventual return to PLOF.      Follow Up Recommendations SNF    Equipment Recommendations  Other (comment) (TBD at next venue of care)    Recommendations for Other Services       Precautions / Restrictions Precautions Precautions: Fall Precaution Comments: Enteric precautions Restrictions Weight Bearing Restrictions: No      Mobility  Bed Mobility Overal bed mobility: Needs Assistance Bed Mobility: Supine to Sit;Sit to Supine     Supine to sit: Min assist Sit to supine: Min assist   General bed mobility comments: Min A for trunk and BLE control and positioning  Transfers Overall transfer level: Needs assistance Equipment used: Rolling walker (2  wheeled) Transfers: Sit to/from Stand Sit to Stand: Min assist;From elevated surface         General transfer comment: Min A to stand from an elevated surface with pt unable to come to full upright standing position  Ambulation/Gait Ambulation/Gait assistance: Min assist Gait Distance (Feet): 1 Feet Assistive device: Rolling walker (2 wheeled) Gait Pattern/deviations: Step-to pattern;Trunk flexed;Shuffle;Decreased step length - right;Decreased step length - left Gait velocity: decreased   General Gait Details: Pt able to take 2-3 small, effortful, shuffling steps at the EOB before requiring to return to sitting; pt ambulated with flexed trunk and mod lean on the RW for support  Stairs            Wheelchair Mobility    Modified Rankin (Stroke Patients Only)       Balance Overall balance assessment: Needs assistance Sitting-balance support: No upper extremity supported;Feet supported Sitting balance-Leahy Scale: Poor Sitting balance - Comments: Occasional posterior lean with min A to correct Postural control: Posterior lean Standing balance support: Bilateral upper extremity supported;During functional activity Standing balance-Leahy Scale: Poor Standing balance comment: Min A for stability in standing                             Pertinent Vitals/Pain Pain Assessment: No/denies pain Faces Pain Scale: No hurt    Home Living Family/patient expects to be discharged to:: Private residence Living Arrangements: Spouse/significant other Available Help at Discharge: Family;Available 24 hours/day Type of Home: House Home Access: Level entry     Home Layout: One level Home  Equipment: Walker - 2 wheels Additional Comments: Per wife, pt lives in [facility] c threshold entry and walk in & tub showers.     Prior Function Level of Independence: Needs assistance   Gait / Transfers Assistance Needed: Requires assist + RW at SNF for t/fs  ADL's / Homemaking Assistance  Needed: Assist for LBD, bathing, toileting  Comments: History taken from chart review secondary to pt unable to provide     Hand Dominance   Dominant Hand: Left    Extremity/Trunk Assessment   Upper Extremity Assessment Upper Extremity Assessment: Generalized weakness    Lower Extremity Assessment Lower Extremity Assessment: Generalized weakness       Communication   Communication: No difficulties  Cognition Arousal/Alertness: Awake/alert Behavior During Therapy: Flat affect Overall Cognitive Status: No family/caregiver present to determine baseline cognitive functioning                                 General Comments: Decreased energy / motivation but participates c encouragement from wife at bed side. Pleasant t/o       General Comments      Exercises Total Joint Exercises Ankle Circles/Pumps: AROM;Strengthening;Both;10 reps;5 reps Short Arc Quad: AROM;AAROM;Both;10 reps Heel Slides: AAROM;Both;5 reps Hip ABduction/ADduction: AAROM;Both;10 reps;Strengthening Straight Leg Raises: AAROM;Strengthening;Both;10 reps Long Arc Quad: AROM;Both;10 reps;Strengthening Other Exercises Other Exercises: Pt and family educated re: OT role, DME recs, d/c recs, falls prevention, ECS, importance of mobility for functional strengthening Other Exercises: Don/doff gwon/socks, self-feeding, sup<>sit, sit<>stand, sitting/standing balance/tolerance, side steps at EOB   Assessment/Plan    PT Assessment Patient needs continued PT services  PT Problem List Decreased strength;Decreased activity tolerance;Decreased balance;Decreased mobility;Decreased knowledge of use of DME       PT Treatment Interventions DME instruction;Gait training;Functional mobility training;Therapeutic activities;Therapeutic exercise;Balance training;Patient/family education    PT Goals (Current goals can be found in the Care Plan section)  Acute Rehab PT Goals Patient Stated Goal: To get  stronger PT Goal Formulation: With patient Time For Goal Achievement: 03/23/20 Potential to Achieve Goals: Fair    Frequency Min 2X/week   Barriers to discharge Inaccessible home environment      Co-evaluation               AM-PAC PT "6 Clicks" Mobility  Outcome Measure Help needed turning from your back to your side while in a flat bed without using bedrails?: A Little Help needed moving from lying on your back to sitting on the side of a flat bed without using bedrails?: A Little Help needed moving to and from a bed to a chair (including a wheelchair)?: A Lot Help needed standing up from a chair using your arms (e.g., wheelchair or bedside chair)?: A Little Help needed to walk in hospital room?: Total Help needed climbing 3-5 steps with a railing? : Total 6 Click Score: 13    End of Session Equipment Utilized During Treatment: Gait belt;Other (comment) (belt placed under arms to avoid PEG tube) Activity Tolerance: Patient tolerated treatment well Patient left: in bed;with call bell/phone within reach;with bed alarm set Nurse Communication: Mobility status PT Visit Diagnosis: Unsteadiness on feet (R26.81);Difficulty in walking, not elsewhere classified (R26.2);Muscle weakness (generalized) (M62.81)    Time: 0623-7628 PT Time Calculation (min) (ACUTE ONLY): 33 min   Charges:   PT Evaluation $PT Eval Moderate Complexity: 1 Mod PT Treatments $Therapeutic Exercise: 8-22 mins        D. Scott Malary [place] PT,  DPT 03/11/20, 5:22 PM

## 2020-03-11 NOTE — Evaluation (Signed)
Occupational Therapy Evaluation Patient Details Name: Alex Christensen MRN: [id number] DOB: [birth year] Today's Date: 03/11/2020    History of Present Illness Alex Christensen is a 79 y.o. male with medical history significant of CML, hypertension, chronic kidney disease stage III, hyperlipidemia who is a resident of [facility] with indwelling catheter that was brought in by staff secondary to cloudy urine diarrhea as well as clogged Foley catheter.  Patient has been a resident there was poor oral intake.  He has no significant dysphagia but due to malnutrition has a PEG tube in place for feeding.  He has had previous C. difficile colitis and they noticed his diarrhea similar to previous ones.    Clinical Impression   Alex Christensen was seen for OT evaluation this date. Prior to hospital admission, pt was at [facility] requiring assist for mobility and LB ADLs. Pt presents to acute OT demonstrating impaired ADL performance and functional mobility 2/2 decreased LB access, functional strength/ROM/balance deficits, and decreased activity tolerance. Pt currently requires SETUP self-feeding/face washing at bed level. MOD A don gown seated EOB - assist for sitting balance 2/2 posterior lean and threading over BUE. MOD A for ADL t/f and 2 side steps at EOB. Pt would benefit from skilled OT to address noted impairments and functional limitations (see below for any additional details) in order to maximize safety and independence while minimizing falls risk and caregiver burden. Upon hospital discharge, recommend STR to maximize pt safety and return to PLOF.     Follow Up Recommendations  SNF    Equipment Recommendations   (TBD at next venue of care)    Recommendations for Other Services       Precautions / Restrictions Precautions Precautions: Fall Restrictions Weight Bearing Restrictions: No      Mobility Bed Mobility Overal bed mobility: Needs Assistance Bed Mobility: Supine to Sit;Sit to Supine      Supine to sit: Mod assist;HOB elevated Sit to supine: Max assist      Transfers Overall transfer level: Needs assistance   Transfers: Sit to/from Stand Sit to Stand: Mod assist;From elevated surface         General transfer comment: MOD A for sit<>stand and ~2 side steps at EOB    Balance Overall balance assessment: Needs assistance Sitting-balance support: No upper extremity supported;Feet supported Sitting balance-Leahy Scale: Poor Sitting balance - Comments: Progressive posterior lean c fatigue Postural control: Posterior lean Standing balance support: Bilateral upper extremity supported Standing balance-Leahy Scale: Poor                             ADL either performed or assessed with clinical judgement   ADL Overall ADL's : Needs assistance/impaired                                       General ADL Comments: SETUP self-feeding/face washing at bed level. MOD A don gown seated EOB - assist for sitting balance and threading over BUE. MOD A for ADL t/f      Vision         Perception     Praxis      Pertinent Vitals/Pain Pain Assessment: Faces Faces Pain Scale: No hurt     Hand Dominance Left   Extremity/Trunk Assessment Upper Extremity Assessment Upper Extremity Assessment: Generalized weakness (BUE AROM WFL, grossly 4-/5)   Lower Extremity Assessment Lower  Extremity Assessment: Generalized weakness       Communication Communication Communication: No difficulties   Cognition Arousal/Alertness: Awake/alert Behavior During Therapy: Flat affect Overall Cognitive Status: Within Functional Limits for tasks assessed                                 General Comments: Decreased energy / motivation but participates c encouragement from wife at bed side. Pleasant t/o    General Comments       Exercises Exercises: Other exercises Other Exercises Other Exercises: Pt and family educated re: OT role, DME recs,  d/c recs, falls prevention, ECS, importance of mobility for functional strengthening Other Exercises: Don/doff gwon/socks, self-feeding, sup<>sit, sit<>stand, sitting/standing balance/tolerance, side steps at EOB   Shoulder Instructions      Home Living Family/patient expects to be discharged to:: Skilled nursing facility Living Arrangements: Spouse/significant other                               Additional Comments: Per wife, pt lives in [facility] c threshold entry and walk in & tub showers.       Prior Functioning/Environment Level of Independence: Needs assistance  Gait / Transfers Assistance Needed: Requires assist + RW at SNF for t/fs ADL's / Homemaking Assistance Needed: Assist for LBD, bathing, toileting            OT Problem List: Decreased strength;Decreased range of motion;Decreased activity tolerance;Impaired balance (sitting and/or standing);Decreased safety awareness      OT Treatment/Interventions: Self-care/ADL training;Therapeutic exercise;Energy conservation;DME and/or AE instruction;Therapeutic activities;Patient/family education;Balance training    OT Goals(Current goals can be found in the care plan section) Acute Rehab OT Goals Patient Stated Goal: To return to PLOF  OT Goal Formulation: With patient/family Time For Goal Achievement: 03/25/20 Potential to Achieve Goals: Fair ADL Goals Pt Will Perform Grooming: sitting;with supervision Pt Will Perform Lower Body Dressing: bed level;with min assist Pt Will Transfer to Toilet: with min assist;regular height toilet;stand pivot transfer (c LRAD PRN)  OT Frequency: Min 2X/week   Barriers to D/C: Decreased caregiver support          Co-evaluation              AM-PAC OT "6 Clicks" Daily Activity     Outcome Measure Help from another person eating meals?: None Help from another person taking care of personal grooming?: A Little Help from another person toileting, which includes using toliet,  bedpan, or urinal?: A Lot Help from another person bathing (including washing, rinsing, drying)?: A Lot Help from another person to put on and taking off regular upper body clothing?: A Little Help from another person to put on and taking off regular lower body clothing?: A Lot 6 Click Score: 16   End of Session    Activity Tolerance: Patient limited by lethargy Patient left: in bed;with call bell/phone within reach;with bed alarm set;with family/visitor present  OT Visit Diagnosis: Other abnormalities of gait and mobility (R26.89);Muscle weakness (generalized) (M62.81)                Time: 1027-2536 OT Time Calculation (min): 40 min Charges:  OT General Charges $OT Visit: 1 Visit OT Evaluation $OT Eval Moderate Complexity: 1 Mod OT Treatments $Self Care/Home Management : 23-37 mins  Dessie Coma, M.S. OTR/L  03/11/20, 2:54 PM  ascom [phone]

## 2020-03-11 NOTE — Care Management Important Message (Signed)
Important Message  Patient Details  Name: Alex Christensen MRN: [id number] Date of Birth: 1941-02-22   Medicare Important Message Given:  N/A - LOS <3 / Initial given by admissions     [name] 03/11/2020, 7:56 AM

## 2020-03-11 NOTE — NC FL2 (Signed)
[place] LEVEL OF CARE SCREENING TOOL     IDENTIFICATION  Patient Name: Alex Christensen Birthdate: 1941/08/09 Sex: male Admission Date (Current Location): 03/09/2020  [facility] and [place] Number:  Engineering geologist and Address:  [facility], [address], [place], [place] [address]      Provider Number: [id number]  Attending Physician Name and Address:  Sidney Ace, MD  Relative Name and Phone Number:  Prentiss Polio (wife) [phone]    Current Level of Care: Hospital Recommended Level of Care: [place] Prior Approval Number:    Date Approved/Denied:   PASRR Number: [id number]  Discharge Plan: SNF    Current Diagnoses: Patient Active Problem List   Diagnosis Date Noted   Pressure injury of skin 03/11/2020   C. 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 12/17/2019   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[place]) 04/15/2019    Orientation RESPIRATION BLADDER Height & Weight     Self  Normal Indwelling catheter Weight: (!) 42.6 kg Height:  5\' 5"  (165.1 cm)  BEHAVIORAL SYMPTOMS/MOOD NEUROLOGICAL BOWEL NUTRITION STATUS      Incontinent Feeding tube  AMBULATORY STATUS COMMUNICATION OF NEEDS Skin   Total Care Verbally Other (Comment) (MASD- areas of pressure injury)                       Personal Care Assistance Level of Assistance  Total care       Total Care Assistance: Maximum assistance   Functional Limitations Info             SPECIAL CARE FACTORS FREQUENCY                       Contractures Contractures Info: Not present    Additional Factors Info   Code Status, Allergies Code Status Info: FULL Allergies Info: NKA           Current Medications (03/11/2020):  This is the current hospital active medication list Current Facility-Administered Medications  Medication Dose Route Frequency Provider Last Rate Last Admin   0.9 %  sodium chloride infusion   Intravenous Continuous Ralene Muskrat B, MD 75 mL/hr at 03/11/20 0927 New Bag at 03/11/20 0927   aspirin EC tablet 325 mg  325 mg Oral Daily Gala Romney L, MD   325 mg at 03/11/20 0914   atorvastatin (LIPITOR) tablet 10 mg  10 mg Oral Daily Gala Romney L, MD   10 mg at 03/11/20 0914   cefTRIAXone (ROCEPHIN) 1 g in sodium chloride 0.9 % 100 mL IVPB  1 g Intravenous Q24H Gala Romney L, MD 200 mL/hr at 03/10/20 1856 1 g at 03/10/20 1856   Chlorhexidine Gluconate Cloth 2 % PADS 6 each  6 each Topical Daily Ralene Muskrat B, MD   6 each at 03/11/20 0914   clopidogrel (PLAVIX) tablet 75 mg  75 mg Oral Daily Gala Romney L, MD   75 mg at 03/11/20 0914   ferrous sulfate tablet 324 mg  324 mg Oral Daily Gala Romney L, MD   324 mg at 03/11/20 0913   ondansetron (ZOFRAN) tablet 4 mg  4 mg Oral Q6H PRN  Elwyn Reach, MD       Or   ondansetron [facility]) injection 4 mg  4 mg Intravenous Q6H PRN Elwyn Reach, MD       traZODone (DESYREL) tablet 75 mg  75 mg Oral QHS Gala Romney L, MD   75 mg at 03/10/20 2251   vancomycin (VANCOCIN) 50 mg/mL oral solution 125 mg  125 mg Oral QID Elwyn Reach, MD   125 mg at 03/11/20 1332   Followed by   Derrill Memo ON 03/23/2020] vancomycin (VANCOCIN) 50 mg/mL oral solution 125 mg  125 mg Oral BID Elwyn Reach, MD       Followed by   Derrill Memo ON 03/31/2020] vancomycin (VANCOCIN) 50 mg/mL oral solution 125 mg  125 mg Oral Daily Elwyn Reach, MD       Followed by   Derrill Memo ON 04/07/2020] vancomycin (VANCOCIN) 50 mg/mL oral solution 125 mg  125 mg Oral Ervin Knack, MD       Followed by   Derrill Memo ON  05/05/2020] vancomycin (VANCOCIN) 50 mg/mL oral solution 125 mg  125 mg Oral Q3 days Elwyn Reach, MD         Discharge Medications: Please see discharge summary for a list of discharge medications.  Relevant Imaging Results:  Relevant Lab Results:   Additional Information SS# [id number]  Shelbie Hutching, RN

## 2020-03-11 NOTE — TOC Initial Note (Signed)
Transition of Care [facility]) - Initial/Assessment Note    Patient Details  Name: Alex Christensen MRN: [id number] Date of Birth: Apr 05, 1941  Transition of Care [facility]) CM/SW Contact:    Shelbie Hutching, RN Phone Number: 03/11/2020, 3:22 PM  Clinical Narrative:                 Patient is from [facility] and is long term care there.  Patient is followed by outpatient palliative through [facility] at the facility.  Plan for discharge is to return to [facility].   Expected Discharge Plan: Skilled Nursing Facility Barriers to Discharge: Continued Medical Work up   Patient Goals and CMS Choice   CMS Medicare.gov Compare Post Acute Care list provided to:: Patient Represenative (must comment) Choice offered to / list presented to : Spouse  Expected Discharge Plan and Services Expected Discharge Plan: [place]   Discharge Planning Services: CM Consult Post Acute Care Choice: [place] Living arrangements for the past 2 months: [place]                                      Prior Living Arrangements/Services Living arrangements for the past 2 months: [place] Lives with:: Facility Resident Patient language and need for interpreter reviewed:: Yes Do you feel safe going back to the place where you live?: Yes      Need for Family Participation in Patient Care: Yes (Comment) (long term care patient) Care giver support system in place?: Yes (comment) (wife and daughter)   Criminal Activity/Legal Involvement Pertinent to Current Situation/Hospitalization: No - Comment as needed  Activities of Daily Living Home Assistive Devices/Equipment: Grab bars around toilet, Grab bars in shower, Hand-held shower hose, Shower chair without back ADL Screening (condition at time of admission) Patient's cognitive ability adequate to safely complete daily activities?: No Is the patient deaf or have difficulty  hearing?: No Does the patient have difficulty seeing, even when wearing glasses/contacts?: No Does the patient have difficulty concentrating, remembering, or making decisions?: Yes Patient able to express need for assistance with ADLs?: No Does the patient have difficulty dressing or bathing?: Yes Independently performs ADLs?: No Communication: Independent Dressing (OT): Needs assistance Is this a change from baseline?: Pre-admission baseline Grooming: Needs assistance Is this a change from baseline?: Pre-admission baseline Feeding: Independent Bathing: Needs assistance Is this a change from baseline?: Pre-admission baseline Toileting: Needs assistance Is this a change from baseline?: Pre-admission baseline In/Out Bed: Needs assistance Is this a change from baseline?: Pre-admission baseline Walks in Home: Needs assistance Is this a change from baseline?: Pre-admission baseline Does the patient have difficulty walking or climbing stairs?: Yes Weakness of Legs: Both Weakness of Arms/Hands: Both  Permission Sought/Granted Permission sought to share information with : Facility Art therapist granted to share information with : Yes, Verbal Permission Granted     Permission granted to share info w AGENCY: [place]        Emotional Assessment   Attitude/Demeanor/Rapport: Unable to Assess Affect (typically observed): Unable to Assess Orientation: : Oriented to Self Alcohol / Substance Use: Not Applicable Psych Involvement: No (comment)  Admission diagnosis:  C. difficile colitis [A04.72] Infectious diarrhea [A09] Urinary tract infection associated with indwelling urethral catheter, initial encounter ([place]) [T61.443X, N39.0] Patient Active Problem List   Diagnosis Date Noted  . Pressure injury of skin 03/11/2020  . C. difficile colitis  03/09/2020  . Sepsis secondary to UTI ([place]) 03/09/2020  . Leucocytosis 03/09/2020  . Essential hypertension  12/23/2019  . Hyperlipemia 12/23/2019  . Anemia 12/23/2019  . CKD (chronic kidney disease), stage IIIa 12/23/2019  . 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 Hyponatremia 12/23/2019  . 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[place]) 04/15/2019   PCP:  [place] Pink, MD Pharmacy:   [id number] [place], [place] [address] [place] [address] Phone: [phone] Fax: [phone]     Social Determinants of Health (SDOH) Interventions    Readmission Risk Interventions No flowsheet data found.

## 2020-03-11 NOTE — Progress Notes (Signed)
Initial Nutrition Assessment  DOCUMENTATION CODES:   Severe malnutrition in context of chronic illness, Underweight  INTERVENTION:  Recommend liberalizing to regular diet.  Initiate nocturnal tube feed regimen: -Osmolite 1.5 Cal at 50 mL/hr x 14 hrs from 1800-0800 (700 mL goal volume) -Provides 1050 kcal (75% minimum estimated kcal needs), 44 grams of protein (63% minimum estimated protein needs), 532 mL H2O daily -Flush tube with 100 mL Q6hrs while patient is on feeding pump overnight and then an additional 100 mL after disconnecting tube feeds in AM  RD will monitor PO intake at follow-up and continue to adjust TF regimen as needed.  Provide MVI po daily.  Maintain HOB >30 degrees while patient is on nocturnal feeds and for at least 1 hour after stopping nocturnal feeds.  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  GOAL:   Patient will meet greater than or equal to 90% of their needs  MONITOR:   PO intake, Labs, Weight trends, TF tolerance, Skin, I & O's  REASON FOR ASSESSMENT:   Malnutrition Screening Tool    ASSESSMENT:   79 year old male with PMHx of CML, HTN, CKD stage III, HLD, hx CVA, iron deficiency anemia who is admitted from [facility] [place] with C difficile colitis.   Met with patient and his wife at bedside. Most of history was provided by wife. She reports that patient had his G-tube placed in June at [facility] in setting of poor PO intake. She reports patient's intake varies by meal. Sometimes he can only eat bites of a meal whereas other times he can eat a little more. There is no meal documentation available in chart. Per RN patient ate well at breakfast and ate approximately 75% of that meal, which is 250 kcal and 19 grams of protein. At lunch he had approximately 33% of his tilapia and bites of macaroni and cheese and  broccoli, which is approximately 75 kcal and 8 grams of protein. At [facility] patient was receiving Osmolite 1.5 Cal at 45 mL/hr x 12 hrs (810 kcal, 34 grams of protein, 410 mL H2O daily) + FWF of 100 mL Q6hrs. Wife reports this was not enough as he was still losing weight and she would like him to receive more calories/protein from nocturnal tube feeding. However she reports he has difficulty tolerating too high of a volume. She is okay with extending to provide over 14 hours. Will aim to meet about 75% of his minimum calorie needs based on current PO intake. Wife reports patient does not have any difficulties with chewing or swallowing and is on a regular texture diet with thin liquids.  Wife reports patient's UBW was 145 lbs. She reports he began slowly losing weight in 2016 but weight loss has been more severe lately. According to chart patient was 52.6 kg on 04/09/2019, 53.1 kg on 05/11/2019, 51.7 kg on 07/07/2019, 52.6 kg on 10/21/2019, and is now 42.6 kg (93.9 lbs). He has lost 10 kg (19% body weight) over the past 4 months, which is significant for time frame.  Medications reviewed and include: ferrous sulfate 324 mg daily, vancomycin, NS at 75 mL/hr, ceftriaxone.  Labs reviewed: BUN 25.  Enteral Access: 18 Fr. G-tube present on admission  Discussed with RN. Discussed with MD via secure chat. Okay to liberalize diet to regular. Plan is to resume nocturnal tube feeds.  NUTRITION -  FOCUSED PHYSICAL EXAM:    Most Recent Value  Orbital Region Severe depletion  Upper Arm Region Severe depletion  Thoracic and Lumbar Region Severe depletion  Buccal Region Severe depletion  Temple Region Severe depletion  Clavicle Bone Region Severe depletion  Clavicle and Acromion Bone Region Severe depletion  Scapular Bone Region Severe depletion  Dorsal Hand Severe depletion  Patellar Region Severe depletion  Anterior Thigh Region Severe depletion  Posterior Calf Region Severe depletion  Edema  (RD Assessment) None  Hair Reviewed  Eyes Reviewed  Mouth Reviewed  Skin Reviewed  Nails Reviewed     Diet Order:   Diet Order            Diet Heart Room service appropriate? Yes; Fluid consistency: Thin  Diet effective now                EDUCATION NEEDS:   No education needs have been identified at this time  Skin:  Skin Assessment: Skin Integrity Issues: Skin Integrity Issues:: Stage I Stage I: right ankle, right hip, left hip  Last BM:  Unknown  Height:   Ht Readings from Last 1 Encounters:  03/10/20 5' 5"  (1.651 m)   Weight:   Wt Readings from Last 1 Encounters:  03/10/20 (!) 42.6 kg   Ideal Body Weight:  61.8 kg  BMI:  Body mass index is 15.63 kg/m.  Estimated Nutritional Needs:   Kcal:  1400-1600  Protein:  70-80 grams  Fluid:  1.3-1.5 L/day  Jacklynn Barnacle, MS, RD, LDN Pager number available on Amion

## 2020-03-12 DIAGNOSIS — E43 Unspecified severe protein-calorie malnutrition: Secondary | ICD-10-CM | POA: Insufficient documentation

## 2020-03-12 LAB — URINE CULTURE: Culture: >=100000 — AB

## 2020-03-12 NOTE — Progress Notes (Signed)
PROGRESS NOTE    Alex Christensen  VZC:[id number] DOB: July 19, 1941 DOA: 03/09/2020 PCP: Maryland Pink, MD   Brief Narrative:  HPI: Alex Christensen is a 79 y.o. male with medical history significant of CML, hypertension, chronic kidney disease stage III, hyperlipidemia who is a resident of [facility] with indwelling catheter that was brought in by staff secondary to cloudy urine diarrhea as well as clogged Foley catheter.  Patient has been a resident there was poor oral intake.  He has no significant dysphagia but due to malnutrition has a PEG tube in place for feeding.  He has had previous C. difficile colitis and they noticed his diarrhea similar to previous ones.  He is weak and a poor historian.  Debilitated also.  Patient brought to the ER and evaluated.  His urinalysis is consistent with possible UTI and his C. difficile assay is positive for C. difficile diarrhea.  No fever or chills.  He is generally weak.  Patient is therefore being admitted to the hospital with C. difficile colitis recurrent and possibly UTI.  His Foley catheter has been replaced.  7/29: Patient seen and examined in the emergency room.  Reports symptomatic improvement since admission.  No fevers noted since admission.  Foley catheter replaced on admission.  7/30: Patient seen and examined.  No decompensating events overnight.  Per patient's report his diarrhea has improved.  Seems to be tolerating p.o.  7/31: Patient seen and examined.  No decompensating events overnight.  Diarrhea resolved however no bowel movements reported over interval.  Tolerating p.o. without issue.  Assessment & Plan:   Principal Problem:   C. 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 difficile colitis Acute infectious diarrhea Urinary tract infection Sepsis  secondary to above, improved Diarrhea resolved No bowel movements reported over last 24 hours Patient tolerating p.o. Bowel sounds normoactive Plan: Continue vancomycin, plans for long-term taper Supplemental intravenous fluids Plan on discharge in 24 hours the patient is able to have a regular bowel movement.  CML Patient on [place].  Will hold while dealing with acute infection  Essential hypertension Does not appear to be on any blood pressure control agents BP controlled over interval  History of CVA Plavix 75 mg daily Aspirin 325 mg daily  Hyperlipidemia Lipitor 10 mg daily  Iron deficiency anemia Continue ferrous sulfate 325 mg daily  Chronic malnutrition Nocturnal tube feeds RD following   DVT prophylaxis: Lovenox Code Status: Full Family Communication: [name] [phone] on 7/30  Disposition Plan: Status is: Inpatient  Remains inpatient appropriate because:Inpatient level of care appropriate due to severity of illness   Dispo: The patient is from: SNF              Anticipated d/c is to: SNF              Anticipated d/c date is: 1 day              Patient currently is not medically stable to d/c.    Anticipate discharge back to skilled nursing facility on 03/13/2020 if patient is able to have bowel movement and continues to tolerate p.o. without issue   Consultants:   None  Procedures:   None  Antimicrobials:  Vancomycin p.o.   Rocephin   Subjective: Patient seen and examined.  Complains of fatigue.  Denies diarrhea  Objective: Vitals:   03/11/20 1726 03/11/20 2007 03/12/20 0013 03/12/20 0315  BP: 128/79 (!) 103/57 (!) 146/76 (!) 159/80  Pulse: 88 74 84 87  Resp: 16 14 17 16   Temp: 98.1 F (36.7 C) 98.2 F (36.8 C) (!) 97.4 F (36.3 C) (!) 97.5 F (36.4 C)  TempSrc:  Oral Oral Oral  SpO2: 100% 99% 100% 100%  Weight:      Height:        Intake/Output Summary (Last 24 hours) at 03/12/2020 1105 Last data filed at  03/12/2020 0319 Gross per 24 hour  Intake 1976.29 ml  Output 1075 ml  Net 901.29 ml   Filed Weights   03/09/20 1452 03/10/20 1639  Weight: 46.8 kg (!) 42.6 kg    Examination:  General: No apparent distress, patient appears chronically ill HEENT: Normocephalic, atraumatic Neck, supple, trachea midline, no tenderness Heart: Regular rate and rhythm, S1/S2 normal, no murmurs Lungs: Clear to auscultation bilaterally, no adventitious sounds, normal work of breathing Abdomen: Soft, nontender, nondistended, positive bowel sounds (normoactive), PEG tube in place Extremities: Normal, atraumatic, no clubbing or cyanosis, normal muscle tone Skin: No rashes or lesions, pale Neurologic: Cranial nerves grossly intact, sensation intact, alert and oriented x1 Psychiatric: Normal affect     Data Reviewed: I have personally reviewed following labs and imaging studies  CBC: Recent Labs  Lab 03/09/20 1543 03/10/20 0503  WBC 17.5* 11.5*  NEUTROABS 15.3*  --   HGB 9.7* 8.3*  HCT 27.7* 23.4*  MCV 96.9 95.5  PLT 642* 500*   Basic Metabolic Panel: Recent Labs  Lab 03/09/20 1543 03/10/20 0503  NA 138 138  K 4.0 3.5  CL 102 105  CO2 26 26  GLUCOSE 102* 124*  BUN 35* 25*  CREATININE 1.10 0.91  CALCIUM 8.6* 7.8*  MG  --  2.2   GFR: Estimated Creatinine Clearance: 39 mL/min (by C-G formula based on SCr of 0.91 mg/dL). Liver Function Tests: Recent Labs  Lab 03/09/20 1543 03/10/20 0503  AST 22 20  ALT 28 23  ALKPHOS 85 66  BILITOT 0.6 0.5  PROT 5.8* 4.6*  ALBUMIN 2.7* 2.2*   No results for input(s): LIPASE, AMYLASE in the last 168 hours. No results for input(s): AMMONIA in the last 168 hours. Coagulation Profile: No results for input(s): INR, PROTIME in the last 168 hours. Cardiac Enzymes: No results for input(s): CKTOTAL, CKMB, CKMBINDEX, TROPONINI in the last 168 hours. BNP (last 3 results) No results for input(s): PROBNP in the last 8760 hours. HbA1C: No results for  input(s): HGBA1C in the last 72 hours. CBG: No results for input(s): GLUCAP in the last 168 hours. Lipid Profile: No results for input(s): CHOL, HDL, LDLCALC, TRIG, CHOLHDL, LDLDIRECT in the last 72 hours. Thyroid Function Tests: No results for input(s): TSH, T4TOTAL, FREET4, T3FREE, THYROIDAB in the last 72 hours. Anemia Panel: No results for input(s): VITAMINB12, FOLATE, FERRITIN, TIBC, IRON, RETICCTPCT in the last 72 hours. Sepsis Labs: Recent Labs  Lab 03/09/20 2207 03/10/20 0503  LATICACIDVEN 1.4 1.1    Recent Results (from the past 240 hour(s))  Urine culture     Status: Abnormal   Collection Time: 03/09/20  5:02 PM   Specimen: Urine, Random  Result Value Ref Range Status   Specimen Description   Final    URINE, RANDOM Performed at [facility], [address] [place], [place] [address]    Special Requests   Final    NONE Performed at [facility], [phone]  [address] [place], [place] [address]    Culture >=100,000 COLONIES/mL PROTEUS MIRABILIS (A)  Final   Report Status 03/12/2020 FINAL  Final   Organism ID, Bacteria PROTEUS MIRABILIS (A)  Final      Susceptibility   Proteus mirabilis - MIC*    AMPICILLIN <=2 SENSITIVE Sensitive     CEFAZOLIN <=4 SENSITIVE Sensitive     CEFTRIAXONE <=0.25 SENSITIVE Sensitive     CIPROFLOXACIN <=0.25 SENSITIVE Sensitive     GENTAMICIN <=1 SENSITIVE Sensitive     IMIPENEM 2 SENSITIVE Sensitive     NITROFURANTOIN 128 RESISTANT Resistant     TRIMETH/SULFA <=20 SENSITIVE Sensitive     AMPICILLIN/SULBACTAM <=2 SENSITIVE Sensitive     PIP/TAZO <=4 SENSITIVE Sensitive     * >=100,000 COLONIES/mL PROTEUS MIRABILIS  Gastrointestinal Panel by PCR , Stool     Status: None   Collection Time: 03/09/20  5:02 PM   Specimen: Stool  Result Value Ref Range Status   Campylobacter species NOT DETECTED NOT DETECTED Final   Plesimonas shigelloides NOT DETECTED NOT DETECTED Final   Salmonella species NOT DETECTED NOT DETECTED Final    Yersinia enterocolitica NOT DETECTED NOT DETECTED Final   Vibrio species NOT DETECTED NOT DETECTED Final   Vibrio cholerae NOT DETECTED NOT DETECTED Final   Enteroaggregative E coli (EAEC) NOT DETECTED NOT DETECTED Final   Enteropathogenic E coli (EPEC) NOT DETECTED NOT DETECTED Final   Enterotoxigenic E coli (ETEC) NOT DETECTED NOT DETECTED Final   Shiga like toxin producing E coli (STEC) NOT DETECTED NOT DETECTED Final   Shigella/Enteroinvasive E coli (EIEC) NOT DETECTED NOT DETECTED Final   Cryptosporidium NOT DETECTED NOT DETECTED Final   Cyclospora cayetanensis NOT DETECTED NOT DETECTED Final   Entamoeba histolytica NOT DETECTED NOT DETECTED Final   Giardia lamblia NOT DETECTED NOT DETECTED Final   Adenovirus F40/41 NOT DETECTED NOT DETECTED Final   Astrovirus NOT DETECTED NOT DETECTED Final   Norovirus GI/GII NOT DETECTED NOT DETECTED Final   Rotavirus A NOT DETECTED NOT DETECTED Final   Sapovirus (I, II, IV, and V) NOT DETECTED NOT DETECTED Final    Comment: Performed at [facility], [address] [place], [place] [address]  C Difficile Quick Screen w PCR reflex     Status: Abnormal   Collection Time: 03/09/20  5:02 PM   Specimen: Stool  Result Value Ref Range Status   C Diff antigen POSITIVE (A) NEGATIVE Final   C Diff toxin POSITIVE (A) NEGATIVE Final   C Diff interpretation Toxin producing C. difficile detected.  Final    Comment: CRITICAL RESULT CALLED TO, READ BACK BY AND VERIFIED WITH: BILL SMITH 03/09/20 AR 1830 BY AR Performed at [facility], [address] [place], [place] [address]   Culture, blood (routine x 2)     Status: None (Preliminary result)   Collection Time: 03/09/20  6:34 PM   Specimen: BLOOD  Result Value Ref Range Status   Specimen Description BLOOD BLOOD RIGHT FOREARM  Final   Special Requests   Final    BOTTLES DRAWN AEROBIC AND ANAEROBIC Blood Culture results may not be optimal due to an inadequate volume of blood received  in culture bottles   Culture   Final    NO GROWTH 3 DAYS Performed at [facility], [address], [place], [place] [address]    Report Status PENDING  Incomplete  SARS Coronavirus 2 by RT PCR (hospital order, performed in [facility] hospital lab) Nasopharyngeal Nasopharyngeal  Swab     Status: None   Collection Time: 03/09/20  6:37 PM   Specimen: Nasopharyngeal Swab  Result Value Ref Range Status   SARS Coronavirus 2 NEGATIVE NEGATIVE Final    Comment: (NOTE) SARS-CoV-2 target nucleic acids are NOT DETECTED.  The SARS-CoV-2 RNA is generally detectable in upper and lower respiratory specimens during the acute phase of infection. The lowest concentration of SARS-CoV-2 viral copies this assay can detect is 250 copies / mL. A negative result does not preclude SARS-CoV-2 infection and should not be used as the sole basis for treatment or other patient management decisions.  A negative result may occur with improper specimen collection / handling, submission of specimen other than nasopharyngeal swab, presence of viral mutation(s) within the areas targeted by this assay, and inadequate number of viral copies (<250 copies / mL). A negative result must be combined with clinical observations, patient history, and epidemiological information.  Fact Sheet for Patients:   StrictlyIdeas.no  Fact Sheet for Healthcare Providers: BankingDealers.co.za  This test is not yet approved or  cleared by the [place] FDA and has been authorized for detection and/or diagnosis of SARS-CoV-2 by FDA under an Emergency Use Authorization (EUA).  This EUA will remain in effect (meaning this test can be used) for the duration of the COVID-19 declaration under Section 564(b)(1) of the Act, 21 U.S.C. section 360bbb-3(b)(1), unless the authorization is terminated or revoked sooner.  Performed at [facility], [address]  [place], [place] [address]   Culture, blood (routine x 2)     Status: None (Preliminary result)   Collection Time: 03/09/20 10:02 PM   Specimen: BLOOD  Result Value Ref Range Status   Specimen Description BLOOD RAC  Final   Special Requests BOTTLES DRAWN AEROBIC AND ANAEROBIC BCAV  Final   Culture   Final    NO GROWTH 3 DAYS Performed at [facility], [address], [place], [place] [address]    Report Status PENDING  Incomplete         Radiology Studies: No results found.      Scheduled Meds: . aspirin EC  325 mg Oral Daily  . atorvastatin  10 mg Oral Daily  . Chlorhexidine Gluconate Cloth  6 each Topical Daily  . clopidogrel  75 mg Oral Daily  . feeding supplement (OSMOLITE 1.5 CAL)  700 mL Per Tube Q24H  . ferrous sulfate  324 mg Oral Daily  . free water  100 mL Per Tube Q6H  . multivitamin with minerals  1 tablet Oral Daily  . traZODone  75 mg Oral QHS  . vancomycin  125 mg Oral QID   Followed by  . [START ON 03/23/2020] vancomycin  125 mg Oral BID   Followed by  . [START ON 03/31/2020] vancomycin  125 mg Oral Daily   Followed by  . [START ON 04/07/2020] vancomycin  125 mg Oral QODAY   Followed by  . [START ON 05/05/2020] vancomycin  125 mg Oral Q3 days   Continuous Infusions: . sodium chloride 75 mL/hr at 03/12/20 0319  . cefTRIAXone (ROCEPHIN)  IV 1 g (03/11/20 1800)     LOS: 3 days    Time spent: 15 minutes    Sidney Ace, MD [facility]   If 7PM-7AM, please contact night-coverage  03/12/2020, 11:05 AM

## 2020-03-13 LAB — CBC WITH DIFFERENTIAL/PLATELET
Abs Immature Granulocytes: 0.05 10*3/uL (ref 0.00–0.07)
Basophils Absolute: 0 10*3/uL (ref 0.0–0.1)
Basophils Relative: 0 %
Eosinophils Absolute: 0.4 10*3/uL (ref 0.0–0.5)
Eosinophils Relative: 5 %
HCT: 21.8 % — ABNORMAL LOW (ref 39.0–52.0)
Hemoglobin: 7.3 g/dL — ABNORMAL LOW (ref 13.0–17.0)
Immature Granulocytes: 1 %
Lymphocytes Relative: 8 %
Lymphs Abs: 0.6 10*3/uL — ABNORMAL LOW (ref 0.7–4.0)
MCH: 33 pg (ref 26.0–34.0)
MCHC: 33.5 g/dL (ref 30.0–36.0)
MCV: 98.6 fL (ref 80.0–100.0)
Monocytes Absolute: 0.5 10*3/uL (ref 0.1–1.0)
Monocytes Relative: 6 %
Neutro Abs: 6.6 10*3/uL (ref 1.7–7.7)
Neutrophils Relative %: 80 %
Platelets: 502 10*3/uL — ABNORMAL HIGH (ref 150–400)
RBC: 2.21 MIL/uL — ABNORMAL LOW (ref 4.22–5.81)
RDW: 15 % (ref 11.5–15.5)
WBC: 8.2 10*3/uL (ref 4.0–10.5)
nRBC: 0 % (ref 0.0–0.2)

## 2020-03-13 LAB — COMPREHENSIVE METABOLIC PANEL
ALT: 22 U/L (ref 0–44)
AST: 22 U/L (ref 15–41)
Albumin: 2.1 g/dL — ABNORMAL LOW (ref 3.5–5.0)
Alkaline Phosphatase: 87 U/L (ref 38–126)
Anion gap: 8 (ref 5–15)
BUN: 13 mg/dL (ref 8–23)
CO2: 23 mmol/L (ref 22–32)
Calcium: 7.6 mg/dL — ABNORMAL LOW (ref 8.9–10.3)
Chloride: 102 mmol/L (ref 98–111)
Creatinine, Ser: 0.63 mg/dL (ref 0.61–1.24)
GFR calc Af Amer: >60 mL/min (ref >60)
GFR calc non Af Amer: >60 mL/min (ref >60)
Glucose, Bld: 111 mg/dL — ABNORMAL HIGH (ref 70–99)
Potassium: 3.4 mmol/L — ABNORMAL LOW (ref 3.5–5.1)
Sodium: 133 mmol/L — ABNORMAL LOW (ref 135–145)
Total Bilirubin: 0.4 mg/dL (ref 0.3–1.2)
Total Protein: 4.2 g/dL — ABNORMAL LOW (ref 6.5–8.1)

## 2020-03-13 MED ORDER — CEFDINIR 300 MG PO CAPS: 300.0000 mg | ORAL_CAPSULE | Freq: Two times a day (BID) | ORAL | 0 refills | Status: DC

## 2020-03-13 MED ORDER — VANCOMYCIN HCL 125 MG PO CAPS: 125.0000 mg | ORAL_CAPSULE | Freq: Four times a day (QID) | ORAL | 0 refills | Status: AC

## 2020-03-13 MED ORDER — CEFDINIR 300 MG PO CAPS
300.0000 mg | ORAL_CAPSULE | Freq: Two times a day (BID) | ORAL | Status: AC
  Administered 2020-03-13 – 2020-03-14 (×4): 300 mg via ORAL
  Filled 2020-03-13 (×4): qty 1

## 2020-03-13 MED ORDER — VANCOMYCIN HCL 125 MG PO CAPS: 125.0000 mg | ORAL_CAPSULE | ORAL | 0 refills | Status: DC

## 2020-03-13 MED ORDER — IMATINIB MESYLATE 100 MG PO TABS: 300.0000 mg | ORAL_TABLET | Freq: Every day | ORAL | Status: DC

## 2020-03-13 MED ORDER — VANCOMYCIN HCL 125 MG PO CAPS: 125.0000 mg | ORAL_CAPSULE | ORAL | 0 refills | Status: AC

## 2020-03-13 MED ORDER — VANCOMYCIN HCL 125 MG PO CAPS: 125.0000 mg | ORAL_CAPSULE | Freq: Every day | ORAL | 0 refills | Status: AC

## 2020-03-13 NOTE — Progress Notes (Signed)
PROGRESS NOTE    Alex Christensen  YBO:[id number] DOB: 04/17/1941 DOA: 03/09/2020 PCP: Maryland Pink, MD    Brief Narrative:   IDP:OEUMPNT [name] a 79 y.o.malewith medical history significant ofCML, hypertension, chronic kidney disease stage III, hyperlipidemia who is a resident of [facility] with indwelling catheter that was brought in by staff secondary to cloudy urine diarrhea as well as clogged Foley catheter. Patient has been a resident there was poor oral intake. He has no significant dysphagia but due to malnutrition has a PEG tube in place for feeding. He has had previous C. difficile colitis and they noticed his diarrhea similar to previous ones. He is weak and a poor historian. Debilitated also. Patient brought to the ER and evaluated. His urinalysis is consistent with possible UTI and his C. difficile assay is positive for C. difficile diarrhea. No fever or chills. He is generally weak. Patient is therefore being admitted to the hospital with C. difficile colitis recurrent and possibly UTI. His Foley catheter has been replaced.  7/29: Patient seen and examined in the emergency room.  Reports symptomatic improvement since admission.  No fevers noted since admission.  Foley catheter replaced on admission.  7/30: Patient seen and examined.  No decompensating events overnight.  Per patient's report his diarrhea has improved.  Seems to be tolerating p.o.  7/31: Patient seen and examined.  No decompensating events overnight.  Diarrhea resolved however no bowel movements reported over interval.  Tolerating p.o. without issue.  8/1: Patient seen and examined.  Diarrhea resolved.  Patient tolerating p.o. without issue.  Patient medically stable for discharge however unsafe discharge plan at this time.  Patient was previously a resident at [facility] for the past 3 weeks.  After discussion with the patient's wife she feels that the care there was inappropriate  or insufficient she does not wish that he return there.  Case management is aware and is looking for other placement options.  Assessment & Plan:   Principal Problem:   C. 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 difficile colitis Acute infectious diarrhea Urinary tract infection Sepsis secondary to above, improved Diarrhea resolved Patient endorses BM Patient tolerating p.o. Bowel sounds normoactive Plan: Continue vancomycin, plans for long-term taper.  Prescribed on discharge DC IVF Patient is medically stable for discharge at this time.  We do not have a safe disposition plan.  CML Patient on [place].  Will hold while dealing with acute infection  Essential hypertension Does not appear to be on any blood pressure control agents BP controlled over interval  History of CVA Plavix 75 mg daily Aspirin 325 mg daily  Hyperlipidemia Lipitor 10 mg daily  Iron deficiency anemia Continue ferrous sulfate 325 mg daily  Chronic malnutrition Nocturnal tube feeds RD following    DVT prophylaxis: Lovenox Code Status: Full Family Communication: None today Disposition Plan: Status is: Inpatient  Remains inpatient appropriate because:Unsafe d/c plan   Dispo: The patient is from: SNF              Anticipated d/c is to: SNF              Anticipated d/c date is: 1 day              Patient currently is medically stable to d/c.   Patient's acute colitis has improved.  No longer having diarrhea.  Bowel sounds improved.  Patient was previously resident at [facility] and patient's wife does not wish for him to return there.  Case management is aware and looking for other options.  Anticipated discharge on 8-21     Consultants:   None  Procedures:   None  Antimicrobials:   P.o.  vancomycin   Subjective: Seen and examined.  Complains of fatigue.  Otherwise asymptomatic.  No pain complaints.  Objective: Vitals:   03/12/20 1700 03/12/20 2028 03/13/20 0421 03/13/20 0802  BP: 114/76 (!) 134/77 (!) 141/82 (!) 107/63  Pulse: 88 83 82 92  Resp: 22 16 16 16   Temp: 99 F (37.2 C) (!) 97.5 F (36.4 C) (!) 97.5 F (36.4 C) 98 F (36.7 C)  TempSrc: Oral Oral Oral   SpO2: 99% 98% 100% 100%  Weight:      Height:        Intake/Output Summary (Last 24 hours) at 03/13/2020 1129 Last data filed at 03/13/2020 0422 Gross per 24 hour  Intake --  Output 1400 ml  Net -1400 ml   Filed Weights   03/09/20 1452 03/10/20 1639  Weight: 46.8 kg (!) 42.6 kg    Examination:  General: No apparent distress, patient appears chronically ill HEENT: Normocephalic, atraumatic Neck, supple, trachea midline, no tenderness Heart: Regular rate and rhythm, S1/S2 normal, no murmurs Lungs: Clear to auscultation bilaterally, no adventitious sounds, normal work of breathing Abdomen: Soft, nontender, nondistended, normal active bowel sounds, PEG tube in place Extremities: Normal, atraumatic, no clubbing or cyanosis, normal muscle tone Skin: No rashes or lesions, pale Neurologic: Cranial nerves grossly intact, sensation intact, alert and oriented x1 Psychiatric: Normal affect    Data Reviewed: I have personally reviewed following labs and imaging studies  CBC: Recent Labs  Lab 03/09/20 1543 03/10/20 0503 03/13/20 0748  WBC 17.5* 11.5* 8.2  NEUTROABS 15.3*  --  6.6  HGB 9.7* 8.3* 7.3*  HCT 27.7* 23.4* 21.8*  MCV 96.9 95.5 98.6  PLT 642* 538* 858*   Basic Metabolic Panel: Recent Labs  Lab 03/09/20 1543 03/10/20 0503 03/13/20 0748  NA 138 138 133*  K 4.0 3.5 3.4*  CL 102 105 102  CO2 26 26 23   GLUCOSE 102* 124* 111*  BUN 35* 25* 13  CREATININE 1.10 0.91 0.63  CALCIUM 8.6* 7.8* 7.6*  MG  --  2.2  --    GFR: Estimated Creatinine Clearance: 44.4 mL/min (by C-G formula  based on SCr of 0.63 mg/dL). Liver Function Tests: Recent Labs  Lab 03/09/20 1543 03/10/20 0503 03/13/20 0748  AST 22 20 22   ALT 28 23 22   ALKPHOS 85 66 87  BILITOT 0.6 0.5 0.4  PROT 5.8* 4.6* 4.2*  ALBUMIN 2.7* 2.2* 2.1*   No results for input(s): LIPASE, AMYLASE in the last 168 hours. No results for input(s): AMMONIA in the last 168 hours. Coagulation Profile: No results for input(s): INR, PROTIME in the last 168 hours. Cardiac Enzymes: No results for input(s): CKTOTAL, CKMB, CKMBINDEX, TROPONINI in the last 168 hours. BNP (last 3 results) No results for input(s): PROBNP in the last 8760 hours. HbA1C: No results for input(s): HGBA1C in the last 72 hours. CBG: No results for input(s): GLUCAP in the last 168 hours. Lipid Profile: No results for input(s): CHOL, HDL, LDLCALC, TRIG, CHOLHDL, LDLDIRECT in the last 72 hours. Thyroid Function Tests: No results for input(s): TSH, T4TOTAL, FREET4, T3FREE, THYROIDAB in the last 72 hours. Anemia Panel: No results for  input(s): VITAMINB12, FOLATE, FERRITIN, TIBC, IRON, RETICCTPCT in the last 72 hours. Sepsis Labs: Recent Labs  Lab 03/09/20 2207 03/10/20 0503  LATICACIDVEN 1.4 1.1    Recent Results (from the past 240 hour(s))  Urine culture     Status: Abnormal   Collection Time: 03/09/20  5:02 PM   Specimen: Urine, Random  Result Value Ref Range Status   Specimen Description   Final    URINE, RANDOM Performed at [facility], [address], [place], [place] [address]    Special Requests   Final    NONE Performed at [facility], [address] [place], [place] [address]    Culture >=100,000 COLONIES/mL PROTEUS MIRABILIS (A)  Final   Report Status 03/12/2020 FINAL  Final   Organism ID, Bacteria PROTEUS MIRABILIS (A)  Final      Susceptibility   Proteus mirabilis - MIC*    AMPICILLIN <=2 SENSITIVE Sensitive     CEFAZOLIN <=4 SENSITIVE Sensitive     CEFTRIAXONE <=0.25 SENSITIVE Sensitive      CIPROFLOXACIN <=0.25 SENSITIVE Sensitive     GENTAMICIN <=1 SENSITIVE Sensitive     IMIPENEM 2 SENSITIVE Sensitive     NITROFURANTOIN 128 RESISTANT Resistant     TRIMETH/SULFA <=20 SENSITIVE Sensitive     AMPICILLIN/SULBACTAM <=2 SENSITIVE Sensitive     PIP/TAZO <=4 SENSITIVE Sensitive     * >=100,000 COLONIES/mL PROTEUS MIRABILIS  Gastrointestinal Panel by PCR , Stool     Status: None   Collection Time: 03/09/20  5:02 PM   Specimen: Stool  Result Value Ref Range Status   Campylobacter species NOT DETECTED NOT DETECTED Final   Plesimonas shigelloides NOT DETECTED NOT DETECTED Final   Salmonella species NOT DETECTED NOT DETECTED Final   Yersinia enterocolitica NOT DETECTED NOT DETECTED Final   Vibrio species NOT DETECTED NOT DETECTED Final   Vibrio cholerae NOT DETECTED NOT DETECTED Final   Enteroaggregative E coli (EAEC) NOT DETECTED NOT DETECTED Final   Enteropathogenic E coli (EPEC) NOT DETECTED NOT DETECTED Final   Enterotoxigenic E coli (ETEC) NOT DETECTED NOT DETECTED Final   Shiga like toxin producing E coli (STEC) NOT DETECTED NOT DETECTED Final   Shigella/Enteroinvasive E coli (EIEC) NOT DETECTED NOT DETECTED Final   Cryptosporidium NOT DETECTED NOT DETECTED Final   Cyclospora cayetanensis NOT DETECTED NOT DETECTED Final   Entamoeba histolytica NOT DETECTED NOT DETECTED Final   Giardia lamblia NOT DETECTED NOT DETECTED Final   Adenovirus F40/41 NOT DETECTED NOT DETECTED Final   Astrovirus NOT DETECTED NOT DETECTED Final   Norovirus GI/GII NOT DETECTED NOT DETECTED Final   Rotavirus A NOT DETECTED NOT DETECTED Final   Sapovirus (I, II, IV, and V) NOT DETECTED NOT DETECTED Final    Comment: Performed at [facility], [address] [place], [place] [address]  C Difficile Quick Screen w PCR reflex     Status: Abnormal   Collection Time: 03/09/20  5:02 PM   Specimen: Stool  Result Value Ref Range Status   C Diff antigen POSITIVE (A) NEGATIVE Final   C Diff toxin  POSITIVE (A) NEGATIVE Final   C Diff interpretation Toxin producing C. difficile detected.  Final    Comment: CRITICAL RESULT CALLED TO, READ BACK BY AND VERIFIED WITH: BILL SMITH 03/09/20 AR 1830 BY AR Performed at [facility], [address] [place], [place] [address]   Culture, blood (routine x 2)     Status: None (Preliminary result)   Collection Time: 03/09/20  6:34 PM   Specimen: BLOOD  Result Value Ref Range Status   Specimen Description BLOOD BLOOD RIGHT FOREARM  Final   Special Requests   Final    BOTTLES DRAWN AEROBIC AND ANAEROBIC Blood Culture results may not be optimal due to an inadequate volume of blood received in culture bottles   Culture   Final    NO GROWTH 4 DAYS Performed at [facility], [address] [place], [place] [address]    Report Status PENDING  Incomplete  SARS Coronavirus 2 by RT PCR (hospital order, performed in [place] hospital lab) Nasopharyngeal Nasopharyngeal Swab     Status: None   Collection Time: 03/09/20  6:37 PM   Specimen: Nasopharyngeal Swab  Result Value Ref Range Status   SARS Coronavirus 2 NEGATIVE NEGATIVE Final    Comment: (NOTE) SARS-CoV-2 target nucleic acids are NOT DETECTED.  The SARS-CoV-2 RNA is generally detectable in upper and lower respiratory specimens during the acute phase of infection. The lowest concentration of SARS-CoV-2 viral copies this assay can detect is 250 copies / mL. A negative result does not preclude SARS-CoV-2 infection and should not be used as the sole basis for treatment or other patient management decisions.  A negative result may occur with improper specimen collection / handling, submission of specimen other than nasopharyngeal swab, presence of viral mutation(s) within the areas targeted by this assay, and inadequate number of viral copies (<250 copies / mL). A negative result must be combined with clinical observations, patient history, and epidemiological  information.  Fact Sheet for Patients:   StrictlyIdeas.no  Fact Sheet for Healthcare Providers: BankingDealers.co.za  This test is not yet approved or  cleared by the [place] FDA and has been authorized for detection and/or diagnosis of SARS-CoV-2 by FDA under an Emergency Use Authorization (EUA).  This EUA will remain in effect (meaning this test can be used) for the duration of the COVID-19 declaration under Section 564(b)(1) of the Act, 21 U.S.C. section 360bbb-3(b)(1), unless the authorization is terminated or revoked sooner.  Performed at [facility], [address] [place], [place] [address]   Culture, blood (routine x 2)     Status: None (Preliminary result)   Collection Time: 03/09/20 10:02 PM   Specimen: BLOOD  Result Value Ref Range Status   Specimen Description BLOOD RAC  Final   Special Requests BOTTLES DRAWN AEROBIC AND ANAEROBIC BCAV  Final   Culture   Final    NO GROWTH 4 DAYS Performed at [facility], [address] [place], [place] [address]    Report Status PENDING  Incomplete         Radiology Studies: No results found.      Scheduled Meds: . aspirin EC  325 mg Oral Daily  . atorvastatin  10 mg Oral Daily  . cefdinir  300 mg Oral Q12H  . Chlorhexidine Gluconate Cloth  6 each Topical Daily  . clopidogrel  75 mg Oral Daily  . feeding supplement (OSMOLITE 1.5 CAL)  700 mL Per Tube Q24H  . ferrous sulfate  324 mg Oral Daily  . free water  100 mL Per Tube Q6H  . multivitamin with minerals  1 tablet Oral Daily  . traZODone  75 mg Oral QHS  . vancomycin  125 mg Oral QID   Followed by  . [START ON 03/23/2020] vancomycin  125 mg Oral BID   Followed by  . [START ON 03/31/2020] vancomycin  125 mg Oral Daily  Followed by  . [START ON 04/07/2020] vancomycin  125 mg Oral QODAY   Followed by  . [START ON 05/05/2020] vancomycin  125 mg Oral Q3 days   Continuous Infusions:    LOS: 4 days    Time spent: 15 minutes    Sidney Ace, MD [facility] Pager [phone]  If 7PM-7AM, please contact night-coverage 03/13/2020, 11:29 AM

## 2020-03-13 NOTE — TOC Progression Note (Signed)
Transition of Care [facility]) - Progression Note    Patient Details  Name: Alex Christensen MRN: [id number] Date of Birth: 11-28-1940  Transition of Care [facility] CM/SW Contact  Boris Sharper, LCSW Phone Number: 03/13/2020, 10:27 AM  Clinical Narrative:    CSW was notified by the nurse that pt is from [facility] but pt's Wife does not want him to go back there. CSW contacted pt's wife to discuss this matter she stated that the pt had been at [facility] for about 3 weeks and she was not pleased with the care he received. Pt's wife stated that she would like him to go to [facility] but was open to other option in [place] and [place]. CSW faxed out referral to surrounding facilities.  CSW notified pt's MD and discharge was cancelled due to unsafe discharge.   TOC will continue to follow.  Expected Discharge Plan: [place] Barriers to Discharge: Continued Medical Work up  Expected Discharge Plan and Services Expected Discharge Plan: [place]   Discharge Planning Services: CM Consult Post Acute Care Choice: [place] Living arrangements for the past 2 months: [place] Expected Discharge Date: 03/13/20                                     Social Determinants of Health (SDOH) Interventions    Readmission Risk Interventions No flowsheet data found.

## 2020-03-14 ENCOUNTER — Encounter: Payer: Self-pay | Admitting: Internal Medicine

## 2020-03-14 LAB — CULTURE, BLOOD (ROUTINE X 2)
Culture: NO GROWTH
Culture: NO GROWTH

## 2020-03-14 MED ORDER — SODIUM BICARBONATE 650 MG PO TABS
650.0000 mg | ORAL_TABLET | Freq: Once | ORAL | Status: AC
  Administered 2020-03-14: 650 mg
  Filled 2020-03-14: qty 1

## 2020-03-14 MED ORDER — PANCRELIPASE (LIP-PROT-AMYL) 12000-38000 UNITS PO CPEP
24000.0000 [IU] | ORAL_CAPSULE | Freq: Once | ORAL | Status: AC
  Administered 2020-03-14: 24000 [IU] via ORAL
  Filled 2020-03-14: qty 2

## 2020-03-14 MED ORDER — PANCRELIPASE (LIP-PROT-AMYL) 12000-38000 UNITS PO CPEP
24000.0000 [IU] | ORAL_CAPSULE | Freq: Once | ORAL | Status: AC
  Administered 2020-03-14: 24000 [IU] via ORAL
  Filled 2020-03-14 (×2): qty 2

## 2020-03-14 NOTE — Discharge Summary (Addendum)
Physician Discharge Summary  Alex Christensen VEL:[id number] DOB: 03-19-1941 DOA: 03/09/2020  PCP: Maryland Pink, MD  Admit date: 03/09/2020 Discharge date: 03/15/2020  Admitted From: Home Disposition:  SNF  Recommendations for Outpatient Follow-up:  1. Follow up with PCP in 1-2 weeks 2.   Home Health:No Equipment/Devices:PEG Discharge Condition:Stable CODE STATUS:Full Diet recommendation:  Dysphagia   Brief/Interim Summary:  CHE:NIDPOEU [name] a 79 y.o.malewith medical history significant ofCML, hypertension, chronic kidney disease stage III, hyperlipidemia who is a resident of [facility] with indwelling catheter that was brought in by staff secondary to cloudy urine diarrhea as well as clogged Foley catheter. Patient has been a resident there was poor oral intake. He has no significant dysphagia but due to malnutrition has a PEG tube in place for feeding. He has had previous C. difficile colitis and they noticed his diarrhea similar to previous ones. He is weak and a poor historian. Debilitated also. Patient brought to the ER and evaluated. His urinalysis is consistent with possible UTI and his C. difficile assay is positive for C. difficile diarrhea. No fever or chills. He is generally weak. Patient is therefore being admitted to the hospital with C. difficile colitis recurrent and possibly UTI. His Foley catheter has been replaced.  7/29: Patient seen and examined in the emergency room. Reports symptomatic improvement since admission. No fevers noted since admission. Foley catheter replaced on admission.  7/30: Patient seen and examined. No decompensating events overnight. Per patient's report his diarrhea has improved. Seems to be tolerating p.o.  7/31:Patient seen and examined. No decompensating events overnight. Diarrhea resolved however no bowel movements reported over interval. Tolerating p.o. without issue.  8/1: Patient seen and examined.   Diarrhea resolved.  Patient tolerating p.o. without issue.  Patient medically stable for discharge however unsafe discharge plan at this time.  Patient was previously a resident at [facility] for the past 3 weeks.  After discussion with the patient's wife she feels that the care there was inappropriate or insufficient she does not wish that he return there.  Case management is aware and is looking for other placement options.  8/2: Patient seen and examined.  Diarrhea resolved.  Tolerating p.o. without issue.  Unfortunately PEG tube became clogged because I believe somebody was putting medications down there even though the patient takes medications by mouth.  [facility] however we cannot safely dispo patient until PEG tube issue has been addressed  8/3: Patient seen and examined on the day of discharge.  Interventional radiology evaluated and replace PEG.  PEG tube was flushed.  Able to use immediately.  Stable for discharge to [facility] at this time.  Discharge Diagnoses:  Principal Problem:   C. 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 difficile colitis Acute infectious diarrhea Urinary tract infection Sepsis secondary to above, improved Diarrhea resolved Patient endorses BM Patient tolerating p.o. Bowel sounds normoactive Plan: Vancomycin long-term taper prescribed  CML Patient on [place].  This has been restarted  Essential hypertension Does not appear to be on any blood pressure control agents BP controlled over interval  History of CVA Plavix 75 mg daily Aspirin 325 mg  daily  Hyperlipidemia Lipitor 10 mg daily  Iron deficiency anemia Continue ferrous sulfate 325 mg daily  Chronic malnutrition Nocturnal tube feeds RD following Nocturnal tube feeds as follows: feeding supplement (OSMOLITE 1.5 CAL) liquid 700 mL Dose: 700 mL Freq: Every 24 hours Route: PER TUBE Last Dose: Stopped (03/14/20 0058) Start: 03/11/20 1800 Admin Instructions: Provide Osmolite 1.5 Cal at 50 mL/hr x 14 hrs each evening from 1800-0800     Discharge Instructions  Discharge Instructions    Diet - low sodium heart healthy   Complete by: As directed    Increase activity slowly   Complete by: As directed    Increase activity slowly   Complete by: As directed    Increase activity slowly   Complete by: As directed    No wound care   Complete by: As directed    No wound care   Complete by: As directed    No wound care   Complete by: As directed      Allergies as of 03/15/2020   No Known Allergies     Medication List    TAKE these medications   aspirin 325 MG tablet Take 1 tablet (325 mg total) by mouth daily. Stop after 3 months and continue plavix alone   atorvastatin 10 MG tablet Commonly known as: LIPITOR Take 10 mg by mouth daily.   clopidogrel 75 MG tablet Commonly known as: PLAVIX Take 1 tablet (75 mg total) by mouth daily.   feeding supplement (OSMOLITE 1.5 CAL) Liqd Place 1,000 mLs into feeding tube continuous. feeding supplement (OSMOLITE 1.5 CAL) liquid 700 mL Dose: 700 mL Freq: Every 24 hours Route: PER TUBE Last Dose: Stopped (03/14/20 0058) Start: 03/11/20 1800 Admin Instructions: Provide Osmolite 1.5 Cal at 50 mL/hr x 14 hrs each evening from 1800-0800   ferrous sulfate 324 MG Tbec Take 324 mg by mouth daily.   imatinib 100 MG tablet Commonly known as: GLEEVEC Take 3 tablets (300 mg total) by mouth daily.   traZODone 50 MG tablet Commonly known as: DESYREL Take 75 mg by mouth at bedtime.   vancomycin 125 MG capsule Commonly known as:  VANCOCIN Take 1 capsule (125 mg total) by mouth 4 (four) times daily for 9 days.   vancomycin 125 MG capsule Commonly known as: VANCOCIN Take 1 capsule (125 mg total) by mouth 4 (four) times daily for 7 days. Start taking on: March 23, 2020   vancomycin 125 MG capsule Commonly known as: VANCOCIN Take 1 capsule (125 mg total) by mouth daily for 7 days. Start taking on: March 31, 2020   vancomycin 125 MG capsule Commonly known as: VANCOCIN Take 1 capsule (125 mg total) by mouth every other day for 8 days. Start taking on: April 07, 2020   vancomycin 125 MG capsule Commonly known as: VANCOCIN Take 1 capsule (125 mg total) by mouth every 3 (three) days for 7 doses. Start taking on: May 05, 2020       Contact information for after-discharge care    Destination    [facility] Preferred SNF .   Service: Skilled Nursing Contact information: [address] [place] [phone]                 No Known Allergies  Consultations:  IR   Procedures/Studies: No results found. (Echo, Carotid, EGD, Colonoscopy, ERCP)    Subjective: Seen and examined the day of discharge.  Stable, no distress.  Stable for discharge to skilled nursing facility  Discharge Exam: Vitals:   03/15/20 1433 03/15/20 1434  BP: (!) 134/114 (!) 149/125  Pulse: 96 88  Resp: 16   Temp: 98.2 F (36.8 C)   SpO2: 100%    Vitals:   03/15/20 1245 03/15/20 1247 03/15/20 1433 03/15/20 1434  BP: (!) 144/110 134/77 (!) 134/114 (!) 149/125  Pulse: 88 87 96 88  Resp: 14  16   Temp: 98.2 F (36.8 C)  98.2 F (36.8 C)   TempSrc:      SpO2: 100%  100%   Weight:      Height:        General: Pt is alert, awake, not in acute distress Cardiovascular: RRR, S1/S2 +, no rubs, no gallops Respiratory: CTA bilaterally, no wheezing, no rhonchi Abdominal: Soft, NT, ND, bowel sounds +, PEG tube in place Extremities: no edema, no cyanosis    The results  of significant diagnostics from this hospitalization (including imaging, microbiology, ancillary and laboratory) are listed below for reference.     Microbiology: Recent Results (from the past 240 hour(s))  Urine culture     Status: Abnormal   Collection Time: 03/09/20  5:02 PM   Specimen: Urine, Random  Result Value Ref Range Status   Specimen Description   Final    URINE, RANDOM Performed at [facility], [address] [place], [place] [address]    Special Requests   Final    NONE Performed at [facility], [address] [place], [place] [address]    Culture >=100,000 COLONIES/mL PROTEUS MIRABILIS (A)  Final   Report Status 03/12/2020 FINAL  Final   Organism ID, Bacteria PROTEUS MIRABILIS (A)  Final      Susceptibility   Proteus mirabilis - MIC*    AMPICILLIN <=2 SENSITIVE Sensitive     CEFAZOLIN <=4 SENSITIVE Sensitive     CEFTRIAXONE <=0.25 SENSITIVE Sensitive     CIPROFLOXACIN <=0.25 SENSITIVE Sensitive     GENTAMICIN <=1 SENSITIVE Sensitive     IMIPENEM 2 SENSITIVE Sensitive     NITROFURANTOIN 128 RESISTANT Resistant     TRIMETH/SULFA <=20 SENSITIVE Sensitive     AMPICILLIN/SULBACTAM <=2 SENSITIVE Sensitive     PIP/TAZO <=4 SENSITIVE Sensitive     * >=100,000 COLONIES/mL PROTEUS MIRABILIS  Gastrointestinal Panel by PCR , Stool     Status: None   Collection Time: 03/09/20  5:02 PM   Specimen: Stool  Result Value Ref Range Status   Campylobacter species NOT DETECTED NOT DETECTED Final   Plesimonas shigelloides NOT DETECTED NOT DETECTED Final   Salmonella species NOT DETECTED NOT DETECTED Final   Yersinia enterocolitica NOT DETECTED NOT DETECTED Final   Vibrio species NOT DETECTED NOT DETECTED Final   Vibrio cholerae NOT DETECTED NOT DETECTED Final   Enteroaggregative E coli (EAEC) NOT DETECTED NOT DETECTED Final   Enteropathogenic E coli (EPEC) NOT DETECTED NOT DETECTED Final   Enterotoxigenic E coli (ETEC) NOT DETECTED NOT DETECTED Final    Shiga like toxin producing E coli (STEC) NOT DETECTED NOT DETECTED Final   Shigella/Enteroinvasive E coli (EIEC) NOT DETECTED NOT DETECTED Final   Cryptosporidium NOT DETECTED NOT DETECTED Final   Cyclospora cayetanensis NOT DETECTED NOT DETECTED Final   Entamoeba histolytica NOT DETECTED NOT DETECTED Final   Giardia lamblia NOT DETECTED NOT DETECTED Final   Adenovirus F40/41 NOT DETECTED NOT DETECTED Final   Astrovirus NOT DETECTED NOT DETECTED Final   Norovirus GI/GII NOT DETECTED NOT DETECTED Final   Rotavirus A NOT DETECTED NOT DETECTED  Final   Sapovirus (I, II, IV, and V) NOT DETECTED NOT DETECTED Final    Comment: Performed at [facility], [place], [place] [address]  C Difficile Quick Screen w PCR reflex     Status: Abnormal   Collection Time: 03/09/20  5:02 PM   Specimen: Stool  Result Value Ref Range Status   C Diff antigen POSITIVE (A) NEGATIVE Final   C Diff toxin POSITIVE (A) NEGATIVE Final   C Diff interpretation Toxin producing C. difficile detected.  Final    Comment: CRITICAL RESULT CALLED TO, READ BACK BY AND VERIFIED WITH: BILL SMITH 03/09/20 AR 1830 BY AR Performed at [facility], [address] [place], [place] [address]   Culture, blood (routine x 2)     Status: None   Collection Time: 03/09/20  6:34 PM   Specimen: BLOOD  Result Value Ref Range Status   Specimen Description BLOOD BLOOD RIGHT FOREARM  Final   Special Requests   Final    BOTTLES DRAWN AEROBIC AND ANAEROBIC Blood Culture results may not be optimal due to an inadequate volume of blood received in culture bottles   Culture   Final    NO GROWTH 5 DAYS Performed at [facility], [address], [place], [place] [address]    Report Status 03/14/2020 FINAL  Final  SARS Coronavirus 2 by RT PCR (hospital order, performed in [facility] hospital lab) Nasopharyngeal Nasopharyngeal Swab     Status: None   Collection Time: 03/09/20  6:37 PM   Specimen:  Nasopharyngeal Swab  Result Value Ref Range Status   SARS Coronavirus 2 NEGATIVE NEGATIVE Final    Comment: (NOTE) SARS-CoV-2 target nucleic acids are NOT DETECTED.  The SARS-CoV-2 RNA is generally detectable in upper and lower respiratory specimens during the acute phase of infection. The lowest concentration of SARS-CoV-2 viral copies this assay can detect is 250 copies / mL. A negative result does not preclude SARS-CoV-2 infection and should not be used as the sole basis for treatment or other patient management decisions.  A negative result may occur with improper specimen collection / handling, submission of specimen other than nasopharyngeal swab, presence of viral mutation(s) within the areas targeted by this assay, and inadequate number of viral copies (<250 copies / mL). A negative result must be combined with clinical observations, patient history, and epidemiological information.  Fact Sheet for Patients:   StrictlyIdeas.no  Fact Sheet for Healthcare Providers: BankingDealers.co.za  This test is not yet approved or  cleared by the [place] FDA and has been authorized for detection and/or diagnosis of SARS-CoV-2 by FDA under an Emergency Use Authorization (EUA).  This EUA will remain in effect (meaning this test can be used) for the duration of the COVID-19 declaration under Section 564(b)(1) of the Act, 21 U.S.C. section 360bbb-3(b)(1), unless the authorization is terminated or revoked sooner.  Performed at [facility], [address] [place], [place] [address]   Culture, blood (routine x 2)     Status: None   Collection Time: 03/09/20 10:02 PM   Specimen: BLOOD  Result Value Ref Range Status   Specimen Description BLOOD RAC  Final   Special Requests BOTTLES DRAWN AEROBIC AND ANAEROBIC BCAV  Final   Culture   Final    NO GROWTH 5 DAYS Performed at [facility], [address]  [place], [place] [address]    Report Status 03/14/2020 FINAL  Final     Labs: BNP (  last 3 results) No results for input(s): BNP in the last 8760 hours. Basic Metabolic Panel: Recent Labs  Lab 03/09/20 1543 03/10/20 0503 03/13/20 0748  NA 138 138 133*  K 4.0 3.5 3.4*  CL 102 105 102  CO2 26 26 23   GLUCOSE 102* 124* 111*  BUN 35* 25* 13  CREATININE 1.10 0.91 0.63  CALCIUM 8.6* 7.8* 7.6*  MG  --  2.2  --    Liver Function Tests: Recent Labs  Lab 03/09/20 1543 03/10/20 0503 03/13/20 0748  AST 22 20 22   ALT 28 23 22   ALKPHOS 85 66 87  BILITOT 0.6 0.5 0.4  PROT 5.8* 4.6* 4.2*  ALBUMIN 2.7* 2.2* 2.1*   No results for input(s): LIPASE, AMYLASE in the last 168 hours. No results for input(s): AMMONIA in the last 168 hours. CBC: Recent Labs  Lab 03/09/20 1543 03/10/20 0503 03/13/20 0748  WBC 17.5* 11.5* 8.2  NEUTROABS 15.3*  --  6.6  HGB 9.7* 8.3* 7.3*  HCT 27.7* 23.4* 21.8*  MCV 96.9 95.5 98.6  PLT 642* 538* 502*   Cardiac Enzymes: No results for input(s): CKTOTAL, CKMB, CKMBINDEX, TROPONINI in the last 168 hours. BNP: Invalid input(s): POCBNP CBG: No results for input(s): GLUCAP in the last 168 hours. D-Dimer No results for input(s): DDIMER in the last 72 hours. Hgb A1c No results for input(s): HGBA1C in the last 72 hours. Lipid Profile No results for input(s): CHOL, HDL, LDLCALC, TRIG, CHOLHDL, LDLDIRECT in the last 72 hours. Thyroid function studies No results for input(s): TSH, T4TOTAL, T3FREE, THYROIDAB in the last 72 hours.  Invalid input(s): FREET3 Anemia work up No results for input(s): VITAMINB12, FOLATE, FERRITIN, TIBC, IRON, RETICCTPCT in the last 72 hours. Urinalysis    Component Value Date/Time   COLORURINE AMBER (A) 03/09/2020 1516   APPEARANCEUR TURBID (A) 03/09/2020 1516   LABSPEC 1.016 03/09/2020 1516   PHURINE 8.0 03/09/2020 1516   GLUCOSEU NEGATIVE 03/09/2020 1516   HGBUR NEGATIVE 03/09/2020 1516   BILIRUBINUR NEGATIVE 03/09/2020 1516    KETONESUR NEGATIVE 03/09/2020 1516   PROTEINUR >=300 (A) 03/09/2020 1516   NITRITE POSITIVE (A) 03/09/2020 1516   LEUKOCYTESUR LARGE (A) 03/09/2020 1516   Sepsis Labs Invalid input(s): PROCALCITONIN,  WBC,  LACTICIDVEN Microbiology Recent Results (from the past 240 hour(s))  Urine culture     Status: Abnormal   Collection Time: 03/09/20  5:02 PM   Specimen: Urine, Random  Result Value Ref Range Status   Specimen Description   Final    URINE, RANDOM Performed at [facility], [address] [place], [place] [address]    Special Requests   Final    NONE Performed at [facility], [address] [place], [place] [address]    Culture >=100,000 COLONIES/mL PROTEUS MIRABILIS (A)  Final   Report Status 03/12/2020 FINAL  Final   Organism ID, Bacteria PROTEUS MIRABILIS (A)  Final      Susceptibility   Proteus mirabilis - MIC*    AMPICILLIN <=2 SENSITIVE Sensitive     CEFAZOLIN <=4 SENSITIVE Sensitive     CEFTRIAXONE <=0.25 SENSITIVE Sensitive     CIPROFLOXACIN <=0.25 SENSITIVE Sensitive     GENTAMICIN <=1 SENSITIVE Sensitive     IMIPENEM 2 SENSITIVE Sensitive     NITROFURANTOIN 128 RESISTANT Resistant     TRIMETH/SULFA <=20 SENSITIVE Sensitive     AMPICILLIN/SULBACTAM <=2 SENSITIVE Sensitive     PIP/TAZO <=4 SENSITIVE Sensitive     * >=100,000 COLONIES/mL PROTEUS MIRABILIS  Gastrointestinal Panel by PCR , Stool     Status: None   Collection Time: 03/09/20  5:02 PM   Specimen: Stool  Result Value Ref Range Status   Campylobacter species NOT DETECTED NOT DETECTED Final   Plesimonas shigelloides NOT DETECTED NOT DETECTED Final   Salmonella species NOT DETECTED NOT DETECTED Final   Yersinia enterocolitica NOT DETECTED NOT DETECTED Final   Vibrio species NOT DETECTED NOT DETECTED Final   Vibrio cholerae NOT DETECTED NOT DETECTED Final   Enteroaggregative E coli (EAEC) NOT DETECTED NOT DETECTED Final   Enteropathogenic E coli (EPEC) NOT DETECTED NOT DETECTED  Final   Enterotoxigenic E coli (ETEC) NOT DETECTED NOT DETECTED Final   Shiga like toxin producing E coli (STEC) NOT DETECTED NOT DETECTED Final   Shigella/Enteroinvasive E coli (EIEC) NOT DETECTED NOT DETECTED Final   Cryptosporidium NOT DETECTED NOT DETECTED Final   Cyclospora cayetanensis NOT DETECTED NOT DETECTED Final   Entamoeba histolytica NOT DETECTED NOT DETECTED Final   Giardia lamblia NOT DETECTED NOT DETECTED Final   Adenovirus F40/41 NOT DETECTED NOT DETECTED Final   Astrovirus NOT DETECTED NOT DETECTED Final   Norovirus GI/GII NOT DETECTED NOT DETECTED Final   Rotavirus A NOT DETECTED NOT DETECTED Final   Sapovirus (I, II, IV, and V) NOT DETECTED NOT DETECTED Final    Comment: Performed at [facility], [address] [place], [place] [address]  C Difficile Quick Screen w PCR reflex     Status: Abnormal   Collection Time: 03/09/20  5:02 PM   Specimen: Stool  Result Value Ref Range Status   C Diff antigen POSITIVE (A) NEGATIVE Final   C Diff toxin POSITIVE (A) NEGATIVE Final   C Diff interpretation Toxin producing C. difficile detected.  Final    Comment: CRITICAL RESULT CALLED TO, READ BACK BY AND VERIFIED WITH: BILL SMITH 03/09/20 AR 1830 BY AR Performed at [facility], [address] [place], [place] [address]   Culture, blood (routine x 2)     Status: None   Collection Time: 03/09/20  6:34 PM   Specimen: BLOOD  Result Value Ref Range Status   Specimen Description BLOOD BLOOD RIGHT FOREARM  Final   Special Requests   Final    BOTTLES DRAWN AEROBIC AND ANAEROBIC Blood Culture results may not be optimal due to an inadequate volume of blood received in culture bottles   Culture   Final    NO GROWTH 5 DAYS Performed at [facility], [address] [place], [place] [address]    Report Status 03/14/2020 FINAL  Final  SARS Coronavirus 2 by RT PCR (hospital order, performed in [facility] hospital lab) Nasopharyngeal Nasopharyngeal Swab      Status: None   Collection Time: 03/09/20  6:37 PM   Specimen: Nasopharyngeal Swab  Result Value Ref Range Status   SARS Coronavirus 2 NEGATIVE NEGATIVE Final    Comment: (NOTE) SARS-CoV-2 target nucleic acids are NOT DETECTED.  The SARS-CoV-2 RNA is generally detectable in upper and lower respiratory specimens during the acute phase of infection. The lowest concentration of SARS-CoV-2 viral copies this assay can detect is 250 copies / mL. A negative result does not preclude SARS-CoV-2 infection and should not be used as the sole basis for treatment or other patient management decisions.  A negative result may occur with improper specimen collection / handling, submission of specimen other than nasopharyngeal swab, presence of viral mutation(s) within the areas targeted by this assay, and inadequate  number of viral copies (<250 copies / mL). A negative result must be combined with clinical observations, patient history, and epidemiological information.  Fact Sheet for Patients:   StrictlyIdeas.no  Fact Sheet for Healthcare Providers: BankingDealers.co.za  This test is not yet approved or  cleared by the [place] FDA and has been authorized for detection and/or diagnosis of SARS-CoV-2 by FDA under an Emergency Use Authorization (EUA).  This EUA will remain in effect (meaning this test can be used) for the duration of the COVID-19 declaration under Section 564(b)(1) of the Act, 21 U.S.C. section 360bbb-3(b)(1), unless the authorization is terminated or revoked sooner.  Performed at [facility], [address] [place], [place] [address]   Culture, blood (routine x 2)     Status: None   Collection Time: 03/09/20 10:02 PM   Specimen: BLOOD  Result Value Ref Range Status   Specimen Description BLOOD RAC  Final   Special Requests BOTTLES DRAWN AEROBIC AND ANAEROBIC BCAV  Final   Culture   Final    NO GROWTH 5  DAYS Performed at [facility], [address], [place], [place] [address]    Report Status 03/14/2020 FINAL  Final     Time coordinating discharge: Over 30 minutes  SIGNED:   Sidney Ace, MD  [facility] 03/15/2020, 2:58 PM Pager   If 7PM-7AM, please contact night-coverage

## 2020-03-14 NOTE — Progress Notes (Signed)
PROGRESS NOTE    [facility]  WUG:[id number] DOB: 1941-04-24 DOA: 03/09/2020 PCP: [place] Pink, MD    Brief Narrative:   UUE:[id number] [name] a 79 y.o.[facility] medical history significant ofCML, hypertension, chronic kidney disease stage III, hyperlipidemia who is a resident of [facility] with indwelling catheter that was brought in by staff secondary to cloudy urine diarrhea as well as clogged Foley catheter. Patient has been a resident there was poor oral intake. He has no significant dysphagia but due to malnutrition has a PEG tube in place for feeding. He has had previous C. difficile colitis and they noticed his diarrhea similar to previous ones. He is weak and a poor historian. Debilitated also. Patient brought to the ER and evaluated. His urinalysis is consistent with possible UTI and his C. difficile assay is positive for C. difficile diarrhea. No fever or chills. He is generally weak. Patient is therefore being admitted to the hospital with C. difficile colitis recurrent and possibly UTI. His Foley catheter has been replaced.  7/29: Patient seen and examined in the emergency room.  Reports symptomatic improvement since admission.  No fevers noted since admission.  Foley catheter replaced on admission.  7/30: Patient seen and examined.  No decompensating events overnight.  Per patient's report his diarrhea has improved.  Seems to be tolerating p.o.  7/31: Patient seen and examined.  No decompensating events overnight.  Diarrhea resolved however no bowel movements reported over interval.  Tolerating p.o. without issue.  8/1: Patient seen and examined.  Diarrhea resolved.  Patient tolerating p.o. without issue.  Patient medically stable for discharge however unsafe discharge plan at this time.  Patient was previously a resident at [facility] for the past 3 weeks.  After discussion with the patient's wife she feels that the care there was inappropriate  or insufficient she does not wish that he return there.  Case management is aware and is looking for other placement options.  8/2: Patient seen and examined.  Diarrhea resolved.  Tolerating p.o. without issue.  Unfortunately PEG tube became clogged because I believe somebody was putting medications down there even though the patient takes medications by mouth.  S[facility] bed has been found at [facility] however we cannot safely dispo patient until PEG tube issue has been addressed  Assessment & Plan:   Principal Problem:   C. 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 after midnight Patient is on Plavix  C. difficile colitis Acute infectious diarrhea Urinary tract infection Sepsis secondary to above, improved Diarrhea resolved Patient endorses BM Patient tolerating p.o. Bowel sounds normoactive Plan: Continue vancomycin, plans for long-term taper.  Prescribed on discharge DC IVF Patient is medically stable for discharge at this time.  We do not have a safe disposition plan.  [facility] Patient on [place].  This has been restarted  Essential hypertension Does not appear to be on any blood pressure control agents BP controlled over interval  History of CVA Plavix 75 mg daily Aspirin 325 mg daily  Hyperlipidemia Lipitor 10 mg daily  Iron deficiency anemia Continue ferrous sulfate 325 mg daily  Chronic malnutrition Nocturnal tube feeds RD following    DVT prophylaxis: Lovenox Code Status: Full Family  Communication: Wife at bedside Disposition Plan: Status is: Inpatient  Remains inpatient appropriate because:Unsafe d/c  plan   Dispo: The patient is from: SNF              Anticipated d/c is to: SNF              Anticipated d/c date is: 1 day              Patient currently is medically stable to d/c.   Patient's acute colitis has improved.  No longer having diarrhea.  Bowel sounds improved.  Patient was previously resident at [facility] and patient's wife does not wish for him to return there.  [facility] has a bed at [facility] however we cannot safely dispo the patient there until we have his PEG tube situation under control.  IR consulted for PEG tube evaluation.   Consultants:   None  Procedures:   None  Antimicrobials:   P.o. vancomycin   Subjective: Seen and examined.  Complains of malaise.  Otherwise asymptomatic.  No pain complaints.  Objective: Vitals:   03/13/20 2052 03/13/20 2124 03/14/20 0534 03/14/20 1117  BP: (!) 145/85 127/73 (!) 129/75 132/73  Pulse: 84 85 83 92  Resp: 16 20 16    Temp: 97.6 F (36.4 C) 98.2 F (36.8 C) 97.6 F (36.4 C) 97.8 F (36.6 C)  TempSrc: Oral Oral Axillary   SpO2: 100% 100% 100% 98%  Weight:      Height:        Intake/Output Summary (Last 24 hours) at 03/14/2020 1529 Last data filed at 03/14/2020 0556 Gross per 24 hour  Intake --  Output 1050 ml  Net -1050 ml   Filed Weights   03/09/20 1452 03/10/20 1639  Weight: 46.8 kg (!) 42.6 kg    Examination:  General: No apparent distress, patient appears well, appears chronically ill HEENT: Normocephalic, atraumatic Neck, supple, trachea midline, no tenderness Heart: Regular rate and rhythm, S1/S2 normal, no murmurs Lungs: Clear to auscultation bilaterally, no adventitious sounds, normal work of breathing Abdomen: Soft, nontender, nondistended, positive bowel sounds status post PEG Extremities: Normal, atraumatic, no clubbing or cyanosis, normal muscle tone Skin: No rashes or lesions, normal color, pale Neurologic: Cranial nerves grossly intact, sensation intact, alert and  oriented x2 Psychiatric: Flattened `affect     Data Reviewed: I have personally reviewed following labs and imaging studies  CBC: Recent Labs  Lab 03/09/20 1543 03/10/20 0503 03/13/20 0748  WBC 17.5* 11.5* 8.2  NEUTROABS 15.3*  --  6.6  HGB 9.7* 8.3* 7.3*  HCT 27.7* 23.4* 21.8*  MCV 96.9 95.5 98.6  PLT 642* 538* 956*   Basic Metabolic Panel: Recent Labs  Lab 03/09/20 1543 03/10/20 0503 03/13/20 0748  NA 138 138 133*  K 4.0 3.5 3.4*  CL 102 105 102  CO2 26 26 23   GLUCOSE 102* 124* 111*  BUN 35* 25* 13  CREATININE 1.10 0.91 0.63  CALCIUM 8.6* 7.8* 7.6*  MG  --  2.2  --    GFR: Estimated Creatinine Clearance: 44.4 mL/min (by C-G formula based on SCr of 0.63 mg/dL). Liver Function Tests: Recent Labs  Lab 03/09/20 1543 03/10/20 0503 03/13/20 0748  AST 22 20 22   ALT 28 23 22   ALKPHOS 85 66 87  BILITOT 0.6 0.5 0.4  PROT 5.8* 4.6* 4.2*  ALBUMIN 2.7* 2.2* 2.1*   No results for input(s): LIPASE, AMYLASE in the last 168 hours. No results for input(s): AMMONIA in the last 168  hours. Coagulation Profile: No results for input(s): INR, PROTIME in the last 168 hours. Cardiac Enzymes: No results for input(s): CKTOTAL, CKMB, CKMBINDEX, TROPONINI in the last 168 hours. BNP (last 3 results) No results for input(s): PROBNP in the last 8760 hours. HbA1C: No results for input(s): HGBA1C in the last 72 hours. CBG: No results for input(s): GLUCAP in the last 168 hours. Lipid Profile: No results for input(s): CHOL, HDL, LDLCALC, TRIG, CHOLHDL, LDLDIRECT in the last 72 hours. Thyroid Function Tests: No results for input(s): TSH, T4TOTAL, FREET4, T3FREE, THYROIDAB in the last 72 hours. Anemia Panel: No results for input(s): VITAMINB12, FOLATE, FERRITIN, TIBC, IRON, RETICCTPCT in the last 72 hours. Sepsis Labs: Recent Labs  Lab 03/09/20 2207 03/10/20 0503  LATICACIDVEN 1.4 1.1    Recent Results (from the past 240 hour(s))  Urine culture     Status: Abnormal    Collection Time: 03/09/20  5:02 PM   Specimen: Urine, Random  Result Value Ref Range Status   Specimen Description   Final    URINE, RANDOM Performed at [facility], [address], [place], [place] [address]    Special Requests   Final    NONE Performed at [facility], [address] [place], [place] [address]    Culture >=100,000 COLONIES/mL PROTEUS MIRABILIS (A)  Final   Report Status 03/12/2020 FINAL  Final   Organism ID, Bacteria PROTEUS MIRABILIS (A)  Final      Susceptibility   Proteus mirabilis - MIC*    AMPICILLIN <=2 SENSITIVE Sensitive     CEFAZOLIN <=4 SENSITIVE Sensitive     CEFTRIAXONE <=0.25 SENSITIVE Sensitive     CIPROFLOXACIN <=0.25 SENSITIVE Sensitive     GENTAMICIN <=1 SENSITIVE Sensitive     IMIPENEM 2 SENSITIVE Sensitive     NITROFURANTOIN 128 RESISTANT Resistant     TRIMETH/SULFA <=20 SENSITIVE Sensitive     AMPICILLIN/SULBACTAM <=2 SENSITIVE Sensitive     PIP/TAZO <=4 SENSITIVE Sensitive     * >=100,000 COLONIES/mL PROTEUS MIRABILIS  Gastrointestinal Panel by PCR , Stool     Status: None   Collection Time: 03/09/20  5:02 PM   Specimen: Stool  Result Value Ref Range Status   Campylobacter species NOT DETECTED NOT DETECTED Final   Plesimonas shigelloides NOT DETECTED NOT DETECTED Final   Salmonella species NOT DETECTED NOT DETECTED Final   Yersinia enterocolitica NOT DETECTED NOT DETECTED Final   Vibrio species NOT DETECTED NOT DETECTED Final   Vibrio cholerae NOT DETECTED NOT DETECTED Final   Enteroaggregative E coli (EAEC) NOT DETECTED NOT DETECTED Final   Enteropathogenic E coli (EPEC) NOT DETECTED NOT DETECTED Final   Enterotoxigenic E coli (ETEC) NOT DETECTED NOT DETECTED Final   Shiga like toxin producing E coli (STEC) NOT DETECTED NOT DETECTED Final   Shigella/Enteroinvasive E coli (EIEC) NOT DETECTED NOT DETECTED Final   Cryptosporidium NOT DETECTED NOT DETECTED Final   Cyclospora cayetanensis NOT DETECTED NOT DETECTED  Final   Entamoeba histolytica NOT DETECTED NOT DETECTED Final   Giardia lamblia NOT DETECTED NOT DETECTED Final   Adenovirus F40/41 NOT DETECTED NOT DETECTED Final   Astrovirus NOT DETECTED NOT DETECTED Final   Norovirus GI/GII NOT DETECTED NOT DETECTED Final   Rotavirus A NOT DETECTED NOT DETECTED Final   Sapovirus (I, II, IV, and V) NOT DETECTED NOT DETECTED Final    Comment: Performed at [facility], [address], [place], [place] [address]  C Difficile Quick Screen w PCR reflex  Status: Abnormal   Collection Time: 03/09/20  5:02 PM   Specimen: Stool  Result Value Ref Range Status   C Diff antigen POSITIVE (A) NEGATIVE Final   C Diff toxin POSITIVE (A) NEGATIVE Final   C Diff interpretation Toxin producing C. difficile detected.  Final    Comment: CRITICAL RESULT CALLED TO, READ BACK BY AND VERIFIED WITH: BILL SMITH 03/09/20 AR 1830 BY AR Performed at [facility], [address] [place], [place] [address]   Culture, blood (routine x 2)     Status: None   Collection Time: 03/09/20  6:34 PM   Specimen: BLOOD  Result Value Ref Range Status   Specimen Description BLOOD BLOOD RIGHT FOREARM  Final   Special Requests   Final    BOTTLES DRAWN AEROBIC AND ANAEROBIC Blood Culture results may not be optimal due to an inadequate volume of blood received in culture bottles   Culture   Final    NO GROWTH 5 DAYS Performed at [facility], [address] [place], [place] [address]    Report Status 03/14/2020 FINAL  Final  SARS Coronavirus 2 by RT PCR (hospital order, performed in [place] hospital lab) Nasopharyngeal Nasopharyngeal Swab     Status: None   Collection Time: 03/09/20  6:37 PM   Specimen: Nasopharyngeal Swab  Result Value Ref Range Status   SARS Coronavirus 2 NEGATIVE NEGATIVE Final    Comment: (NOTE) SARS-CoV-2 target nucleic acids are NOT DETECTED.  The SARS-CoV-2 RNA is generally detectable in upper and lower respiratory specimens  during the acute phase of infection. The lowest concentration of SARS-CoV-2 viral copies this assay can detect is 250 copies / mL. A negative result does not preclude SARS-CoV-2 infection and should not be used as the sole basis for treatment or other patient management decisions.  A negative result may occur with improper specimen collection / handling, submission of specimen other than nasopharyngeal swab, presence of viral mutation(s) within the areas targeted by this assay, and inadequate number of viral copies (<250 copies / mL). A negative result must be combined with clinical observations, patient history, and epidemiological information.  Fact Sheet for Patients:   StrictlyIdeas.no  Fact Sheet for [facility] Providers: BankingDealers.co.za  This test is not yet approved or  cleared by the [place] FDA and has been authorized for detection and/or diagnosis of SARS-CoV-2 by FDA under an Emergency Use Authorization (EUA).  This EUA will remain in effect (meaning this test can be used) for the duration of the COVID-19 declaration under Section 564(b)(1) of the Act, 21 U.S.C. section 360bbb-3(b)(1), unless the authorization is terminated or revoked sooner.  Performed at [facility], [address] [place], [place] [address]   Culture, blood (routine x 2)     Status: None   Collection Time: 03/09/20 10:02 PM   Specimen: BLOOD  Result Value Ref Range Status   Specimen Description BLOOD RAC  Final   Special Requests BOTTLES DRAWN AEROBIC AND ANAEROBIC BCAV  Final   Culture   Final    NO GROWTH 5 DAYS Performed at [facility], [address] [place], [place] [address]    Report Status 03/14/2020 FINAL  Final         Radiology Studies: No results found.      Scheduled Meds: . aspirin EC  325 mg Oral Daily  . atorvastatin  10 mg Oral Daily  . cefdinir  300 mg Oral Q12H  . Chlorhexidine  Gluconate  Cloth  6 each Topical Daily  . clopidogrel  75 mg Oral Daily  . feeding supplement (OSMOLITE 1.5 CAL)  700 mL Per Tube Q24H  . ferrous sulfate  324 mg Oral Daily  . free water  100 mL Per Tube Q6H  . imatinib  300 mg Oral Q breakfast  . multivitamin with minerals  1 tablet Oral Daily  . traZODone  75 mg Oral QHS  . vancomycin  125 mg Oral QID   Followed by  . [START ON 03/23/2020] vancomycin  125 mg Oral BID   Followed by  . [START ON 03/31/2020] vancomycin  125 mg Oral Daily   Followed by  . [START ON 04/07/2020] vancomycin  125 mg Oral QODAY   Followed by  . [START ON 05/05/2020] vancomycin  125 mg Oral Q3 days   Continuous Infusions:   LOS: 5 days    Time spent: 25 minutes    Sidney Ace, MD [facility] Pager [phone]  If 7PM-7AM, please contact night-coverage 03/14/2020, 3:29 PM

## 2020-03-14 NOTE — Progress Notes (Signed)
Pt. PEG tube is clogged -up. Attempted to de-clog twice but  unsuccessful. Pt oral intake is very poor. Refused breakfast, Lunch, and dinner. Pt. will be NPO at midnight for possible intervention by IR.

## 2020-03-14 NOTE — Progress Notes (Signed)
Patient transferred from 1C tonight with peg tube clogged. Patient in no acute distress,  RN attempted to unclog peg tube with small amount of coke cola but was unsuccessful. [facility]

## 2020-03-14 NOTE — TOC Progression Note (Signed)
Transition of Care [facility]) - Progression Note    Patient Details  Name: [facility] MRN: [id number] Date of Birth: 1940-11-11  [facility]) CM/SW Contact  Beverly Sessions, RN Phone Number: 03/14/2020, 2:59 PM  Clinical Narrative:    Larena Sox with Claiborne Billings at [facility] that patient is not long term care, he is short term rehab.  TOC spoke with wife.  She also confirms that patient is STR and plans is to return home after completion of SRT.  Wife does not want patient to return to [facility] only other bed offer is [facility].  She has accepted.  Chris notified at [facility]  Plan was for discharge today. EMS packet printed and on chart.  However it was noted that Peg tube is clogged.  This will have to be fixed before patient can discharge  Chris at [facility] updated.  Gerald Stabs also confirms that patient will not need repeat covid test prior to discharge   Expected Discharge Plan: [place] Barriers to Discharge: Continued Medical Work up  Expected Discharge Plan and Services Expected Discharge Plan: [place]   Discharge Planning Services: CM Consult Post Acute Care Choice: [place] Living arrangements for the past 2 months: [place] Expected Discharge Date: 03/14/20                                     Social Determinants of Health (SDOH) Interventions    Readmission Risk Interventions No flowsheet data found.

## 2020-03-15 ENCOUNTER — Encounter: Payer: Self-pay | Admitting: Internal Medicine

## 2020-03-15 MED ORDER — OSMOLITE 1.5 CAL PO LIQD: 1000.0000 mL | ORAL | 0 refills | Status: DC

## 2020-03-15 NOTE — Progress Notes (Signed)
IR called to assess clogged G-tube. Unknown when/where tube placed originally, pt did not know.  Multiple unsuccessful attempts to declog. LUQ tube identified as 18 Fr balloon retention.  Tube removed and replaced with new 18 Fr balloon retention MIC G-tube. Approx 8 mL saline used to fill balloon. Bumper secured. Tube flushed easily without resistance. Tube okay for use immediately  Ascencion Dike PA-C Interventional Radiology 03/15/2020 11:09 AM

## 2020-03-15 NOTE — Care Management Important Message (Signed)
Important Message  Patient Details  Name: Latham Kinzler MRN: [id number] Date of Birth: 02-25-1941   Medicare Important Message Given:  Yes     Beverly Sessions, RN 03/15/2020, 9:58 AM

## 2020-03-15 NOTE — TOC Transition Note (Signed)
Transition of Care [facility]) - CM/SW Discharge Note   Patient Details  Name: Payten Beaumier MRN: [id number] Date of Birth: 1940/09/14  [facility]) CM/SW Contact:  Beverly Sessions, RN Phone Number: 03/15/2020, 2:16 PM   Clinical Narrative:     Patient to discharge to [facility] today  Chris with [facility] notified, DC info sent in the hub  Updated EMS packet on chart Bedside RN to call report.    EMS transport called for   Final next level of care: [place] Barriers to Discharge: No Barriers Identified   Patient Goals and CMS Choice   CMS Medicare.gov Compare Post Acute Care list provided to:: Patient Represenative (must comment) Choice offered to / list presented to : Spouse  Discharge Placement              Patient chooses bed at: [facility] [place] Patient to be transferred to facility by: EMS      Discharge Plan and Services   Discharge Planning Services: CM Consult Post Acute Care Choice: [place]                               Social Determinants of Health (SDOH) Interventions     Readmission Risk Interventions No flowsheet data found.

## 2020-03-15 NOTE — Progress Notes (Signed)
Patient discharge teaching given, including activity, diet, follow-up appoints, and medications. Patient verbalized understanding of all discharge instructions. IV access was d/c'd. Vitals are stable. Skin is intact except as charted in most recent assessments. Report called to  Pt to be transported to [place] by EMS.

## 2020-03-15 NOTE — Progress Notes (Signed)
MD ordered patient to be discharged to [facility].  Discharge instructions were reviewed with [name], RN at the facility and she voiced understanding.  All patients questions were answered. EMS has been called.

## 2020-03-24 ENCOUNTER — Other Ambulatory Visit: Payer: Self-pay

## 2020-03-24 ENCOUNTER — Non-Acute Institutional Stay: Payer: Medicare Other | Admitting: Primary Care

## 2020-03-24 DIAGNOSIS — Z515 Encounter for palliative care: Secondary | ICD-10-CM

## 2020-03-24 DIAGNOSIS — C9210 Chronic myeloid leukemia, BCR/ABL-positive, not having achieved remission: Secondary | ICD-10-CM

## 2020-03-24 NOTE — Progress Notes (Addendum)
[facility] Consult Note Telephone: [phone]  Fax: [phone]  PATIENT NAME: Alex Christensen [address] [place] [address] [phone] (home)  DOB: Dec 19, 1940 MRN: [id number]  PRIMARY CARE PROVIDER:    Juluis Pitch, MD [address] [place],  [place] [address] [phone]   REFERRING PROVIDER:   Juluis Pitch, MD Juluis Pitch, MD [address] [place],  [place] [address]   RESPONSIBLE PARTY:   Extended Emergency Contact Information Primary Emergency Contact: frederky Douglas Mobile Phone: [phone] Relation: Spouse Secondary Emergency Contact: Colston, Pyle Mobile Phone: [phone] Relation: Daughter  I met face to face with patient and family in facility.  ASSESSMENT AND RECOMMENDATIONS:   1. Advance Care Planning/Goals of Care: Goals include to maximize quality of life and symptom management. Our advance care planning conversation included a discussion about:     The value and importance of advance care planning   Experiences with loved ones who have been seriously ill or have died   Exploration of personal, cultural or spiritual beliefs that might influence medical decisions   Exploration of goals of care in the event of a sudden injury or illness   Identification of a healthcare agent - Wife  Review of an  advance directive document .  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   2. Symptom Management:   Nutrition: Eating some, but would like Mech. Soft over puree. He has some tube feeds as well, 750 cal/ day. We discussed careful hand feeding over tube feeding benefits. Wife would like tube to be plugged for several weeks to see if pt appetite picks up. I have discussed with SNF staff to have Med Director speak with wife.  Level of care: Wife has been looking at assisted living but I recommend LTC placement. She could also consider taking pt  home with hospice if patient decline continues after rehab time frame. We also discussed Part B rehab in place as well.   3. Follow up Palliative Care Visit: Palliative care will continue to follow for goals of care clarification and symptom management. Return 2-4 weeks or prn.  4. Family /Caregiver/Community Supports:  Wife at bedside, has children out of state. Currently in SNF.  5. Cognitive / Functional decline: A and O x 1-2. Speaks slowly and processes questions slowly. Can feed self finger foods, is dependent in most adls and all iadls.  I spent 45 minutes providing this consultation,  from 1300 to 1345. More than 50% of the time in this consultation was spent coordinating communication.   CHIEF COMPLAINT: Debility  HISTORY OF PRESENT ILLNESS:  Alex Christensen is a 79 y.o. year old male with multiple medical problems including Debility, immobility, CML. Palliative Care was asked to follow this patient by consultation request of Juluis Pitch, MD  to help address advance care planning and goals of care. This is the initial visit.  CODE STATUS: DNR on chart but SNF states full code  PPS: 30%  HOSPICE ELIGIBILITY/DIAGNOSIS: TBD  PAST MEDICAL HISTORY:  Past Medical History:  Diagnosis Date  . High blood pressure   . 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 Smoking status: Former Research scientist (life sciences)  . Smokeless tobacco: Never Used  Substance Use Topics  . Alcohol use: Yes   FAMILY HX: No family history on file.  ALLERGIES: No Known Allergies   PERTINENT MEDICATIONS:  Outpatient Encounter Medications as of 03/24/2020  Medication Sig  . atorvastatin (LIPITOR) 10 MG tablet Take 10 mg by mouth daily.   . clopidogrel (PLAVIX) 75 MG tablet Take 1 tablet (75 mg total) by mouth daily.  . ferrous sulfate 324 MG TBEC Take 324 mg by mouth daily.  Marland Kitchen imatinib (GLEEVEC) 100 MG tablet Take 3 tablets (300 mg total) by mouth daily.  . Nutritional Supplements  (FEEDING SUPPLEMENT, OSMOLITE 1.5 CAL,) LIQD Place 1,000 mLs into feeding tube continuous. feeding supplement (OSMOLITE 1.5 CAL) liquid 700 mL Dose: 700 mL Freq: Every 24 hours Route: PER TUBE Last Dose: Stopped (03/14/20 0058) Start: 03/11/20 1800 Admin Instructions: Provide Osmolite 1.5 Cal at 50 mL/hr x 14 hrs each evening from 1800-0800  . traZODone (DESYREL) 50 MG tablet Take 75 mg by mouth at bedtime.   . vancomycin (VANCOCIN) 125 MG capsule Take 1 capsule (125 mg total) by mouth 4 (four) times daily for 7 days.  Derrill Memo ON 03/31/2020] vancomycin (VANCOCIN) 125 MG capsule Take 1 capsule (125 mg total) by mouth daily for 7 days.  Derrill Memo ON 04/07/2020] vancomycin (VANCOCIN) 125 MG capsule Take 1 capsule (125 mg total) by mouth every other day for 8 days.  Derrill Memo ON 05/05/2020] vancomycin (VANCOCIN) 125 MG capsule Take 1 capsule (125 mg total) by mouth every 3 (three) days for 7 doses.   No facility-administered encounter medications on file as of 03/24/2020.     PHYSICAL EXAM / ROS:   Current and past weights: 9/20 118, 3/21 = 115 lbs, current 118 lbs General:  frail appearing, thin Cardiovascular: no chest pain reported, no edema, hgb 7.7 on 03/18/20, 7.3 on 03/13/20 Pulmonary: no cough, no increased SOB, room air Abdomen: appetite fair, eating 25% of po feeds, 40 ml x 12 hrs, (750 kcal/ day) incontinent of bowel , c.. diff infection GU: denies dysuria, incontinent of urine MSK:  ++ joint and ROM abnormalities, ambulatory with PT, few steps Skin: bruises on arms Neurological: Weakness,  Moderate cognitive impairment  Jason Coop, NP , DNP, MPH, [facility]  COVID-19 PATIENT SCREENING TOOL  Person answering questions: ____________staff______ _____   1.  Is the patient or any family member in the home showing any signs or symptoms regarding respiratory infection?               Person with Symptom- __________NA_________________  a. Fever                                                                           Yes___ No___          ___________________  b. Shortness of breath                                                    Yes___ No___          ___________________ c. Cough/congestion  Yes___  No___         ___________________ d. Body aches/pains                                                         Yes___ No___        ____________________ e. Gastrointestinal symptoms (diarrhea, nausea)           Yes___ No___        ____________________  2. Within the past 14 days, has anyone living in the home had any contact with someone with or under investigation for COVID-19?    Yes___ No_X_   Person __________________   

## 2020-04-01 ENCOUNTER — Telehealth: Payer: Self-pay | Admitting: Primary Care

## 2020-04-01 NOTE — Telephone Encounter (Signed)
T/c from Wife to discuss hospice services in place or ALF. He will be assessed for ALF but as previously discussed, more likely to be LTC level of care. Discussed hospice COPS and plan of care, philosophy of end of life supportive care, and financial coverage. Will f/u with palliative pt at SNF.

## 2020-04-03 ENCOUNTER — Telehealth: Payer: Self-pay | Admitting: Oncology

## 2020-04-03 NOTE — Telephone Encounter (Signed)
Entered chart by mistake.

## 2020-04-06 ENCOUNTER — Non-Acute Institutional Stay: Payer: Medicare Other | Admitting: Primary Care

## 2020-04-06 ENCOUNTER — Other Ambulatory Visit: Payer: Self-pay

## 2020-04-06 DIAGNOSIS — Z515 Encounter for palliative care: Secondary | ICD-10-CM

## 2020-04-06 DIAGNOSIS — E43 Unspecified severe protein-calorie malnutrition: Secondary | ICD-10-CM

## 2020-04-06 DIAGNOSIS — C9210 Chronic myeloid leukemia, BCR/ABL-positive, not having achieved remission: Secondary | ICD-10-CM

## 2020-04-06 NOTE — Progress Notes (Signed)
[facility] Consult Note Telephone: [phone]  Fax: [phone]  PATIENT NAME: Alex Christensen [address] [place] [address] [phone] (home)  DOB: 11-27-1940 MRN: [id number]  PRIMARY CARE PROVIDER:    Juluis Pitch, MD,  [address] [place] [address] [phone]  REFERRING PROVIDER:   Juluis Pitch, MD [address] [place],  [place] [address] [phone]  RESPONSIBLE PARTY:   Extended Emergency Contact Information Primary Emergency Contact: frederky [place] Mobile Phone: [phone] Relation: Spouse Secondary Emergency Contact: [place], [place] Mobile Phone: [phone] Relation: Daughter  I met face to face with patient and family in facility.  ASSESSMENT AND RECOMMENDATIONS:   1. Advance Care Planning/Goals of Care: Goals include to maximize quality of life and symptom management. Our advance care planning conversation included a discussion about:     The value and importance of advance care planning   Experiences with loved ones who have been seriously ill or have died   Exploration of personal, cultural or spiritual beliefs that might influence medical decisions   Exploration of goals of care in the event of a sudden injury or illness   Identification and preparation of a healthcare agent   Review  of an  advance directive document.   We discussed pt profound decline and FTT. Wife has questions about hospice but also wants to pursue chemotherapeutic. I feel his decline is significant. Wt is 118 lb and he is eating puree diet. Wts a year ago = 128 lbs,8% loss. Pt does have a gastric tube and is receiving some supplemental nutrition although I feel this is not going to reverse his syndrome of decline. She agreed to reverse Full code to DNR and continue to consider scope of treatment options. He has little potential for a resuscitation event given his advanced frailty.  Wife had questions about hospice  services and what care addition that would provide. We discussed goals of care and hospice philosophy,concept of hospice plan of care, and how this might help no matter his venue. [facility] so determine if she can care for him at home.  2. Symptom Management:   Nutrition; eating solid food AMA from outside. Speech has recommended puree and are serving that at [facility]. I discussed case and goals with ST today. There are no more modalities to offer for  Rehabilitation of dysphagia, hence the puree recommendation. Albumin = 2.1 g/dl; hemoglobin = 7.3 on 8/1.   Disease process:  3. Follow up Palliative Care Visit: Palliative care will continue to follow for goals of care clarification and symptom management. Return 1-2 weeks or prn.  4. Family /Caregiver/Community Supports: Wife at bedside, in [place] currently. She has discussed taking him home but has paid for his LTC in place for a month, perhaps 2. Daughters are in [place].  5. Cognitive / Functional decline: Alert, smiling, pleasant. Not able to purposefully converse. Bedbound. Can feed self few bites, other adls dependent.  I spent 50 minutes providing this consultation,  from 1400 to 1450. More than 50% of the time in this consultation was spent coordinating communication.   CHIEF COMPLAINT: poor nutritional intake  HISTORY OF PRESENT ILLNESS:  Alex Christensen is a 79 y.o. year old male with multiple medical problems including dementia, leukemia, FTT. Palliative Care was asked to follow this patient by consultation request of Juluis Pitch, MD to help address advance care planning and goals of care. This is a follow  up visit.  CODE STATUS: TBD, discussing DNR  PPS: 30%  HOSPICE ELIGIBILITY/DIAGNOSIS: yes/protein calorie malnutrition.  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  ALLERGIES: No Known Allergies   PERTINENT MEDICATIONS:  Outpatient Encounter Medications as of 04/06/2020  Medication Sig   atorvastatin (LIPITOR) 10 MG tablet Take 10 mg by mouth daily.    clopidogrel (PLAVIX) 75 MG tablet Take 1 tablet (75 mg total) by mouth daily.   ferrous sulfate 324 MG TBEC Take 324 mg by mouth daily.   imatinib (GLEEVEC) 100 MG tablet Take 3 tablets (300 mg total) by mouth daily.   Nutritional Supplements (FEEDING SUPPLEMENT, OSMOLITE 1.5 CAL,) LIQD Place 1,000 mLs into feeding tube continuous. feeding supplement (OSMOLITE 1.5 CAL) liquid 700 mL Dose: 700 mL Freq: Every 24 hours Route: PER TUBE Last Dose: Stopped (03/14/20 0058) Start: 03/11/20 1800 Admin Instructions: Provide Osmolite 1.5 Cal at 50 mL/hr x 14 hrs each evening from 1800-0800   traZODone (DESYREL) 50 MG tablet Take 75 mg by mouth at bedtime.    vancomycin (VANCOCIN) 125 MG capsule Take 1 capsule (125 mg total) by mouth daily for 7 days.   [START ON 04/07/2020] vancomycin (VANCOCIN) 125 MG capsule Take 1 capsule (125 mg total) by mouth every other day for 8 days.   [START ON 05/05/2020] vancomycin (VANCOCIN) 125 MG capsule Take 1 capsule (125 mg total) by mouth every 3 (three) days for 7 doses.   No facility-administered encounter medications on file as of 04/06/2020.    PHYSICAL EXAM / ROS:   Current and past weights: 118 lbs General: NAD, very frail appearing, pale, thin Cardiovascular: no chest pain reported, no  LE edema  Pulmonary: Occ cough, no increased SOB, room air Abdomen: appetite poor, has tube feeds 750 cals/day, denies constipation, incontinent of bowel GU: denies dysuria, incontinent of urine MSK:  + joint and ROM abnormalities, non  Ambulatory, bedbound Skin: no rashes or wounds reported Neurological: Weakness,  cognitive impairment, denies pain on exam  Jason Coop, NP , DNP, MPH, [facility]  COVID-19 PATIENT SCREENING TOOL  Person answering questions: ____________Staff_____ _____   1.  Is the patient or any family member in the home showing any signs or symptoms regarding respiratory infection?               Person with Symptom- __________NA_________________  a. Fever                                                                          Yes___ No___          ___________________  b. Shortness of breath  Yes___ No___          ___________________ °c. Cough/congestion                                       Yes___  No___         ___________________ °d. Body aches/pains                                                         Yes___ No___        ____________________ °e. Gastrointestinal symptoms (diarrhea, nausea)           Yes___ No___        ____________________ ° °2. Within the past 14 days, has anyone living in the home had any contact with someone with or under investigation for COVID-19?    °Yes___ No_X_   Person __________________ ° ° °

## 2020-04-10 ENCOUNTER — Telehealth: Payer: Self-pay | Admitting: Internal Medicine

## 2020-04-10 NOTE — Telephone Encounter (Signed)
I had a very long discussion with patient's [name] on 8/26 and 8/27-regarding patient's continued treatment for CML in the context of his multiple medical problems [failure to thrive/dementia/recurrent C. Difficile/recent stroke].    Patient most recently in hematologic response.  Long discussion regarding coming off [place] [facility] [especially for hospice enrollment] as CML currently lower on the list of his immediate problems. However, pt has gleevec dose for 3-4 months at hand, which she wants to continue using which is again reasonable. Alex Christensen states that she is currently trying to figure out the "best for her husband and her herself" given the current situation. She will call us if she needs any other resources.   FYI-

## 2020-04-21 ENCOUNTER — Non-Acute Institutional Stay: Payer: Medicare Other | Admitting: Primary Care

## 2020-04-21 ENCOUNTER — Other Ambulatory Visit: Payer: Self-pay

## 2020-04-21 DIAGNOSIS — Z515 Encounter for palliative care: Secondary | ICD-10-CM

## 2020-04-21 DIAGNOSIS — E43 Unspecified severe protein-calorie malnutrition: Secondary | ICD-10-CM

## 2020-04-21 DIAGNOSIS — C9210 Chronic myeloid leukemia, BCR/ABL-positive, not having achieved remission: Secondary | ICD-10-CM

## 2020-04-21 NOTE — Progress Notes (Signed)
[facility] Consult Note Telephone: [phone]  Fax: [phone]  PATIENT NAME: Alex Christensen [address] [place] [address] [phone] (home)  DOB: [birth year] MRN: [id number]  PRIMARY CARE PROVIDER:    Juluis Pitch, MD,  [address] [place] [address] [phone]  REFERRING PROVIDER:   Juluis Pitch, MD [address] [place],  [place] [address] [phone]  RESPONSIBLE PARTY:   Extended Emergency Contact Information Primary Emergency Contact: Alex Christensen [place] Mobile Phone: [phone] Relation: Spouse Secondary Emergency Contact: Blessed, [place] Mobile Phone: [phone] Relation: Daughter  I met face to face with patient in the facility.  ASSESSMENT AND RECOMMENDATIONS:    1. Advance Care Planning/Goals of Care: Goals include to maximize quality of life and symptom management.. Currently Full Code on file at SNF. I will discuss with wife. Albumin was 2.2 in July, 2.1 in August and Sept. 2.8 after feeds began. He is quite frail and debilitated.  2. Symptom Management:   Confused but pleasant, asks for flights to [facility]. Wife would like more standard diet but he is getting puree now. Weights are stable but he gets feeds at hs. We have discussed quality of life diet as ST will not advance diet. Pt has wasting and protein malnutrition. Some improvement of albumin with tube feeds. Has break down on sacrum and scrotum per staff. Skin is beginning to break down per staff, and he continues to have poor po intake.  I put a call into POA wife and left a message. I would like to talk with her about goals of care as he is becoming very frail and remains a full codeWith full interventions desired. He is currently comfortable pleasant and interactive. He volunteers he has no pain. I will continue to monitor and discuss goals of care and care planning with the family.  3. Follow up Palliative Care Visit: Palliative care will  continue to follow for goals of care clarification and symptom management. Return 3-4 weeks or prn.  4. Family /Caregiver/Community Supports: Wife is POA, lives in [place].  5. Cognitive / Functional decline: A and O x 1, unable to do any adls.   I spent 35 minutes providing this consultation,  from 1330 to 1405. More than 50% of the time in this consultation was spent coordinating communication.   CHIEF COMPLAINT: Protein calorie malnutrition  HISTORY OF PRESENT ILLNESS:  Alex Christensen is a 79 y.o. year old male with multiple medical problems including wt loss, malnutrition, leukemia, debility. Palliative Care was asked to follow this patient by consultation request of Juluis Pitch, MD to help address advance care planning and goals of care. This is a follow up visit.  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  ALLERGIES: No Known Allergies   PERTINENT MEDICATIONS:  Outpatient Encounter Medications as of 04/21/2020  Medication Sig   atorvastatin (LIPITOR) 10 MG tablet Take 10 mg by mouth daily.    clopidogrel (PLAVIX) 75 MG tablet Take 1 tablet (75 mg total) by mouth daily.   ferrous sulfate 324 MG TBEC Take 324 mg by mouth daily.  Nutritional Supplements (FEEDING SUPPLEMENT, OSMOLITE 1.5 CAL,) LIQD Place 1,000 mLs into feeding tube continuous. feeding supplement (OSMOLITE 1.5 CAL) liquid 700 mL Dose: 700 mL Freq: Every 24 hours Route: PER TUBE Last Dose: Stopped (03/14/20 0058) Start: 03/11/20 1800 Admin Instructions: Provide Osmolite 1.5 Cal at 50 mL/hr x 14 hrs each evening from 1800-0800   traZODone (DESYREL) 50 MG tablet Take 75  mg by mouth at bedtime.    [START ON 05/05/2020] vancomycin (VANCOCIN) 125 MG capsule Take 1 capsule (125 mg total) by mouth every 3 (three) days for 7 doses.   No facility-administered encounter medications on file as of 04/21/2020.    PHYSICAL EXAM / ROS:   Current and past weights: 117, requested wts to be checked this week. General: frail appearing, cachectic Cardiovascular: S1S2, no chest pain reported, no edema  Pulmonary: LCTA, no cough, no increased SOB, room air , incontinent of bowel GU: denies dysuria, incontinent of urine  MSK:  + contractures and joint and ROM abnormalities,  Non ambulatory Skin: multiple skin bruising, staff reports sacral irritation stage 1. Neurological: Weakness, advanced dementia, denies pain  Jason Coop, NP , DNP, MPH, [facility]  COVID-19 PATIENT SCREENING TOOL  Person answering questions: ____________staff______ _____   1.  Is the patient or any family member in the home showing any signs or symptoms regarding respiratory infection?               Person with Symptom- __________NA_________________  a. Fever                                                                          Yes___ No___          ___________________  b. Shortness of breath                                                    Yes___ No___          ___________________ c. Cough/congestion                                       Yes___  No___         ___________________ d. Body aches/pains                                                         Yes___ No___        ____________________ e. Gastrointestinal symptoms (diarrhea, nausea)           Yes___ No___        ____________________  2. Within the past 14 days, has anyone living in the home had any contact with someone with or under investigation for COVID-19?    Yes___ No_X_   Person __________________

## 2020-05-18 ENCOUNTER — Other Ambulatory Visit: Payer: Self-pay

## 2020-05-18 ENCOUNTER — Non-Acute Institutional Stay: Payer: Medicare Other | Admitting: Primary Care

## 2020-05-18 DIAGNOSIS — Z515 Encounter for palliative care: Secondary | ICD-10-CM

## 2020-05-18 DIAGNOSIS — E43 Unspecified severe protein-calorie malnutrition: Secondary | ICD-10-CM

## 2020-05-18 DIAGNOSIS — C9210 Chronic myeloid leukemia, BCR/ABL-positive, not having achieved remission: Secondary | ICD-10-CM

## 2020-05-18 NOTE — Progress Notes (Signed)
[facility] Consult Note Telephone: [phone]  Fax: [phone]  PATIENT NAME: Alex Christensen [address] [place] [address] [phone] (home)  DOB: 01-19-1941 MRN: [id number]  PRIMARY CARE PROVIDER:    Juluis Pitch, MD,  [address] [place] [address] [phone]  REFERRING PROVIDER:   Juluis Pitch, MD [address] [place],  [place] [address] [phone]  RESPONSIBLE PARTY:   Extended Emergency Contact Information Primary Emergency Contact: Alex Christensen [place] Mobile Phone: [phone] Relation: Spouse Secondary Emergency Contact: Alex Christensen, Alex Christensen Mobile Phone: [phone] Relation: Daughter  I met face to face with patient and family in facility.  ASSESSMENT AND RECOMMENDATIONS:   1. Advance Care Planning/Goals of Care: Goals include to maximize quality of life and symptom management. Goals of care include full scope of treatment options. I met with patient wife and discussed his course of treatment at the SNF in recent weeks. She states he is eating more, but he is getting tube feeds 14 hours/ day now. His weight has stabilized from increased nutrition via tube.  2. Symptom Management:   Intake po remains poor and now has increased tube feeds form 12 to 14 hours. Albumin is stable. Patient denies pain or discomfort but is not mobile in the bed. He must be repositioned for all movement. He is interactive but limited verbal responses. Increasingly immobile and not able to perform any adls, iadls now whereas a month ago he was self feeding some.  3. Follow up Palliative Care Visit: Palliative care will continue to follow for goals of care clarification and symptom management. Return 4 weeks or prn.  4. Family /Caregiver/Community Supports: Wife is POA, living in LTC.  5. Cognitive / Functional decline: A and O x 1, dependent in all adls, iadls. Increasing frailty.  I spent 25 minutes providing this consultation,   from 1330 to 1325. More than 50% of the time in this consultation was spent coordinating communication.   CHIEF COMPLAINT: debility, protein calorie malnutrition  HISTORY OF PRESENT ILLNESS:  Alex Christensen is an 79 y.o. year old male with multiple medical problems including debility, poor intake, immobility, cognitive impairment. Palliative Care was asked to follow this patient by consultation request of Juluis Pitch, MD to help address advance care planning and goals of care. This is a follow up visit.  CODE STATUS: FULL CODE  PPS: 30%  HOSPICE ELIGIBILITY/DIAGNOSIS: TBD  PHYSICAL EXAM / ROS:   Current and past weights: Curent weight is 113 lbs, was 115 lbs  2 weeks ago. 6 months ago 117 lbs.  General:  frail appearing, thin Cardiovascular: no chest pain reported, no  LE edema  Pulmonary: no cough, no increased SOB, room air Abdomen: appetite poor, Currently on tube feedings,  denies constipation, incontinent of bowel GU: denies dysuria, incontinent of urine MSK:  + joint and ROM abnormalities, non-ambulatory Skin:+ pressure injury Neurological: Weakness, denies pain, moderate cognitive impairment.  CURRENT PROBLEM LIST:  Patient Active Problem List   Diagnosis Date Noted   Protein-calorie malnutrition, severe 03/12/2020   Pressure injury of skin 03/11/2020   C. 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  ALLERGIES: No Known Allergies   PERTINENT MEDICATIONS:  Outpatient Encounter Medications as of 05/18/2020  Medication Sig   Amino Acids-Protein Hydrolys (FEEDING SUPPLEMENT, PRO-STAT SUGAR FREE 64,) LIQD Take 30 mLs by mouth in the morning and at bedtime.   aspirin 325 MG EC tablet Take 325 mg by mouth daily.   atorvastatin (LIPITOR) 10 MG tablet Take 10 mg by mouth daily.    clopidogrel (PLAVIX) 75 MG tablet Take 1 tablet (75 mg total) by mouth daily.   ferrous sulfate 324 MG TBEC Take 324 mg by mouth daily.   imatinib (GLEEVEC) 100 MG tablet Take 300 mg by mouth daily. Dissolve in water or apple juice, administer immediately.   Nutritional Supplements (FEEDING SUPPLEMENT, OSMOLITE 1.5 CAL,) LIQD Place 1,000 mLs into feeding tube continuous. feeding supplement (OSMOLITE 1.5 CAL) liquid 700 mL Dose: 700 mL Freq: Every 24 hours Route: PER TUBE Last Dose: Stopped (03/14/20 0058) Start: 03/11/20 1800 Admin Instructions: Provide Osmolite 1.5 Cal at 50 mL/hr x 14 hrs each evening from 1800-0800   ondansetron (ZOFRAN) 4 MG tablet Take 4 mg by mouth every 8 (eight) hours as needed for nausea or vomiting.   traZODone (DESYREL) 50 MG tablet Take 75 mg by mouth at bedtime.    zinc oxide 20 % ointment Apply 1 application topically 3 (three) times daily as needed for irritation.   [DISCONTINUED] vancomycin (VANCOCIN) 125 MG capsule Take 1 capsule (125 mg total) by mouth every 3 (three) days for 7 doses.   No facility-administered encounter medications on file as of 05/18/2020.    Jason Coop, NP , DNP, MPH, [facility]  COVID-19 PATIENT SCREENING TOOL  Person answering questions: ____________staff______ _____   1.  Is the patient or any family member in the home showing any signs or symptoms regarding respiratory infection?               Person  with Symptom- __________NA_________________  a. Fever                                                                          Yes___ No___          ___________________  b. Shortness of breath                                                    Yes___ No___          ___________________ c. Cough/congestion                                       Yes___  No___  ___________________ °d. Body aches/pains                                                         Yes___ No___        ____________________ °e. Gastrointestinal symptoms (diarrhea, nausea)           Yes___ No___        ____________________ ° °2. Within the past 14 days, has anyone living in the home had any contact with someone with or under investigation for COVID-19?    °Yes___ No_X_   Person __________________ ° ° °

## 2020-05-31 ENCOUNTER — Other Ambulatory Visit: Payer: Self-pay

## 2020-05-31 ENCOUNTER — Observation Stay
Admission: EM | Admit: 2020-05-31 | Discharge: 2020-06-01 | Disposition: A | Discharge status: Skilled Nursing Facility | Payer: Medicare Other | Attending: Hospitalist | Admitting: Hospitalist

## 2020-05-31 DIAGNOSIS — I10 Essential (primary) hypertension: Secondary | ICD-10-CM | POA: Diagnosis not present

## 2020-05-31 DIAGNOSIS — R4182 Altered mental status, unspecified: Secondary | ICD-10-CM | POA: Diagnosis not present

## 2020-05-31 DIAGNOSIS — Z20822 Contact with and (suspected) exposure to covid-19: Secondary | ICD-10-CM | POA: Diagnosis not present

## 2020-05-31 DIAGNOSIS — Z87891 Personal history of nicotine dependence: Secondary | ICD-10-CM | POA: Diagnosis not present

## 2020-05-31 DIAGNOSIS — K922 Gastrointestinal hemorrhage, unspecified: Secondary | ICD-10-CM | POA: Diagnosis present

## 2020-05-31 DIAGNOSIS — Z79899 Other long term (current) drug therapy: Secondary | ICD-10-CM | POA: Diagnosis not present

## 2020-05-31 DIAGNOSIS — R531 Weakness: Principal | ICD-10-CM | POA: Insufficient documentation

## 2020-05-31 DIAGNOSIS — D649 Anemia, unspecified: Secondary | ICD-10-CM | POA: Insufficient documentation

## 2020-05-31 MED ORDER — SODIUM CHLORIDE 0.9 % IV BOLUS
500.0000 mL | Freq: Once | INTRAVENOUS | Status: AC
  Administered 2020-06-01: 500 mL via INTRAVENOUS

## 2020-05-31 NOTE — ED Triage Notes (Signed)
Pt to ED via EMS from [facility], according to staff pt has had "change in behavior" x3days. Pt appears pale and hypotensive with ems. Pt With blood around mouth, per ems facility states blood tinged sputum x1 occurrence. Pt has hx of leukemia.

## 2020-06-01 ENCOUNTER — Telehealth: Payer: Self-pay | Admitting: Emergency Medicine

## 2020-06-01 ENCOUNTER — Inpatient Hospital Stay (HOSPITAL_BASED_OUTPATIENT_CLINIC_OR_DEPARTMENT_OTHER)
Admit: 2020-06-01 | Discharge: 2020-06-01 | Disposition: A | Discharge status: Home / Self Care | Payer: Medicare Other | Attending: Family Medicine | Admitting: Family Medicine

## 2020-06-01 ENCOUNTER — Emergency Department: Payer: Medicare Other

## 2020-06-01 DIAGNOSIS — R778 Other specified abnormalities of plasma proteins: Secondary | ICD-10-CM

## 2020-06-01 DIAGNOSIS — I9589 Other hypotension: Secondary | ICD-10-CM | POA: Diagnosis not present

## 2020-06-01 DIAGNOSIS — R400 Somnolence: Secondary | ICD-10-CM

## 2020-06-01 DIAGNOSIS — R7989 Other specified abnormal findings of blood chemistry: Secondary | ICD-10-CM | POA: Diagnosis not present

## 2020-06-01 DIAGNOSIS — E871 Hypo-osmolality and hyponatremia: Secondary | ICD-10-CM

## 2020-06-01 DIAGNOSIS — K922 Gastrointestinal hemorrhage, unspecified: Secondary | ICD-10-CM | POA: Diagnosis present

## 2020-06-01 DIAGNOSIS — E860 Dehydration: Secondary | ICD-10-CM

## 2020-06-01 DIAGNOSIS — D62 Acute posthemorrhagic anemia: Secondary | ICD-10-CM

## 2020-06-01 DIAGNOSIS — R531 Weakness: Secondary | ICD-10-CM | POA: Diagnosis not present

## 2020-06-01 LAB — COMPREHENSIVE METABOLIC PANEL
ALT: 31 U/L (ref 0–44)
AST: 29 U/L (ref 15–41)
Albumin: 2.1 g/dL — ABNORMAL LOW (ref 3.5–5.0)
Alkaline Phosphatase: 62 U/L (ref 38–126)
Anion gap: 7 (ref 5–15)
BUN: 50 mg/dL — ABNORMAL HIGH (ref 8–23)
CO2: 24 mmol/L (ref 22–32)
Calcium: 7.7 mg/dL — ABNORMAL LOW (ref 8.9–10.3)
Chloride: 97 mmol/L — ABNORMAL LOW (ref 98–111)
Creatinine, Ser: 1.14 mg/dL (ref 0.61–1.24)
GFR, Estimated: >60 mL/min (ref >60)
Glucose, Bld: 136 mg/dL — ABNORMAL HIGH (ref 70–99)
Potassium: 4.7 mmol/L (ref 3.5–5.1)
Sodium: 128 mmol/L — ABNORMAL LOW (ref 135–145)
Total Bilirubin: 0.5 mg/dL (ref 0.3–1.2)
Total Protein: 4.5 g/dL — ABNORMAL LOW (ref 6.5–8.1)

## 2020-06-01 LAB — CBC WITH DIFFERENTIAL/PLATELET
Abs Immature Granulocytes: 0.09 10*3/uL — ABNORMAL HIGH (ref 0.00–0.07)
Basophils Absolute: 0.1 10*3/uL (ref 0.0–0.1)
Basophils Relative: 1 %
Eosinophils Absolute: 0.1 10*3/uL (ref 0.0–0.5)
Eosinophils Relative: 1 %
HCT: 17.4 % — ABNORMAL LOW (ref 39.0–52.0)
Hemoglobin: 5.9 g/dL — ABNORMAL LOW (ref 13.0–17.0)
Immature Granulocytes: 1 %
Lymphocytes Relative: 4 %
Lymphs Abs: 0.5 10*3/uL — ABNORMAL LOW (ref 0.7–4.0)
MCH: 30.7 pg (ref 26.0–34.0)
MCHC: 33.9 g/dL (ref 30.0–36.0)
MCV: 90.6 fL (ref 80.0–100.0)
Monocytes Absolute: 0.7 10*3/uL (ref 0.1–1.0)
Monocytes Relative: 6 %
Neutro Abs: 11.1 10*3/uL — ABNORMAL HIGH (ref 1.7–7.7)
Neutrophils Relative %: 87 %
Platelets: 345 10*3/uL (ref 150–400)
RBC: 1.92 MIL/uL — ABNORMAL LOW (ref 4.22–5.81)
RDW: 15.3 % (ref 11.5–15.5)
WBC: 12.5 10*3/uL — ABNORMAL HIGH (ref 4.0–10.5)
nRBC: 0 % (ref 0.0–0.2)

## 2020-06-01 LAB — RESPIRATORY PANEL BY RT PCR (FLU A&B, COVID)
Influenza A by PCR: NEGATIVE
Influenza B by PCR: NEGATIVE
SARS Coronavirus 2 by RT PCR: NEGATIVE

## 2020-06-01 LAB — CBC
HCT: 30.4 % — ABNORMAL LOW (ref 39.0–52.0)
Hemoglobin: 10.9 g/dL — ABNORMAL LOW (ref 13.0–17.0)
MCH: 30.8 pg (ref 26.0–34.0)
MCHC: 35.9 g/dL (ref 30.0–36.0)
MCV: 85.9 fL (ref 80.0–100.0)
Platelets: 277 10*3/uL (ref 150–400)
RBC: 3.54 MIL/uL — ABNORMAL LOW (ref 4.22–5.81)
RDW: 15 % (ref 11.5–15.5)
WBC: 10.4 10*3/uL (ref 4.0–10.5)
nRBC: 0 % (ref 0.0–0.2)

## 2020-06-01 LAB — PREPARE RBC (CROSSMATCH)

## 2020-06-01 LAB — TROPONIN I (HIGH SENSITIVITY)
Troponin I (High Sensitivity): 147 ng/L (ref <18)
Troponin I (High Sensitivity): 158 ng/L (ref <18)
Troponin I (High Sensitivity): 176 ng/L (ref <18)
Troponin I (High Sensitivity): 189 ng/L (ref <18)

## 2020-06-01 LAB — BASIC METABOLIC PANEL
Anion gap: 8 (ref 5–15)
BUN: 47 mg/dL — ABNORMAL HIGH (ref 8–23)
CO2: 22 mmol/L (ref 22–32)
Calcium: 8 mg/dL — ABNORMAL LOW (ref 8.9–10.3)
Chloride: 99 mmol/L (ref 98–111)
Creatinine, Ser: 0.9 mg/dL (ref 0.61–1.24)
GFR, Estimated: >60 mL/min (ref >60)
Glucose, Bld: 81 mg/dL (ref 70–99)
Potassium: 4.3 mmol/L (ref 3.5–5.1)
Sodium: 129 mmol/L — ABNORMAL LOW (ref 135–145)

## 2020-06-01 LAB — MAGNESIUM: Magnesium: 2.1 mg/dL (ref 1.7–2.4)

## 2020-06-01 LAB — ECHOCARDIOGRAM COMPLETE
S' Lateral: 2.06 cm
Weight: 1506.18 oz

## 2020-06-01 LAB — HEMOGLOBIN AND HEMATOCRIT, BLOOD
HCT: 30.3 % — ABNORMAL LOW (ref 39.0–52.0)
Hemoglobin: 10.5 g/dL — ABNORMAL LOW (ref 13.0–17.0)

## 2020-06-01 MED ORDER — ONDANSETRON HCL 4 MG PO TABS: 4.0000 mg | ORAL_TABLET | Freq: Three times a day (TID) | ORAL | Status: DC | PRN

## 2020-06-01 MED ORDER — ONDANSETRON HCL 4 MG/2ML IJ SOLN: 4.0000 mg | Freq: Four times a day (QID) | INTRAMUSCULAR | Status: DC | PRN

## 2020-06-01 MED ORDER — TRAZODONE HCL 50 MG PO TABS: 25.0000 mg | ORAL_TABLET | Freq: Every evening | ORAL | Status: DC | PRN

## 2020-06-01 MED ORDER — ATORVASTATIN CALCIUM 20 MG PO TABS
10.0000 mg | ORAL_TABLET | Freq: Every day | ORAL | Status: DC
  Administered 2020-06-01: 10 mg via ORAL
  Filled 2020-06-01: qty 1

## 2020-06-01 MED ORDER — PRO-STAT SUGAR FREE PO LIQD
30.0000 mL | Freq: Every day | ORAL | Status: DC
  Administered 2020-06-01: 30 mL via ORAL

## 2020-06-01 MED ORDER — ACETAMINOPHEN 650 MG RE SUPP: 650.0000 mg | Freq: Four times a day (QID) | RECTAL | Status: DC | PRN

## 2020-06-01 MED ORDER — SODIUM CHLORIDE 0.9 % IV SOLN: 10.0000 mL/h | Freq: Once | INTRAVENOUS | Status: DC

## 2020-06-01 MED ORDER — OSMOLITE 1.5 CAL PO LIQD
1000.0000 mL | ORAL | Status: DC
  Administered 2020-06-01: 1000 mL

## 2020-06-01 MED ORDER — TRAZODONE HCL 50 MG PO TABS: 75.0000 mg | ORAL_TABLET | Freq: Every day | ORAL | Status: DC

## 2020-06-01 MED ORDER — FERROUS SULFATE 325 (65 FE) MG PO TABS
325.0000 mg | ORAL_TABLET | Freq: Every day | ORAL | Status: DC
  Administered 2020-06-01: 325 mg via ORAL
  Filled 2020-06-01: qty 1

## 2020-06-01 MED ORDER — SODIUM CHLORIDE 0.9 % IV SOLN
8.0000 mg/h | INTRAVENOUS | Status: DC
  Administered 2020-06-01 (×2): 8 mg/h via INTRAVENOUS
  Filled 2020-06-01 (×2): qty 80

## 2020-06-01 MED ORDER — ACETAMINOPHEN 325 MG PO TABS: 650.0000 mg | ORAL_TABLET | Freq: Four times a day (QID) | ORAL | Status: DC | PRN

## 2020-06-01 MED ORDER — ONDANSETRON HCL 4 MG PO TABS: 4.0000 mg | ORAL_TABLET | Freq: Four times a day (QID) | ORAL | Status: DC | PRN

## 2020-06-01 MED ORDER — SODIUM CHLORIDE 0.9 % IV SOLN
80.0000 mg | Freq: Once | INTRAVENOUS | Status: AC
  Administered 2020-06-01: 01:00:00 80 mg via INTRAVENOUS
  Filled 2020-06-01: qty 80

## 2020-06-01 MED ORDER — PANTOPRAZOLE SODIUM 40 MG PO TBEC: 40.0000 mg | DELAYED_RELEASE_TABLET | Freq: Every day | ORAL | Status: DC

## 2020-06-01 MED ORDER — SODIUM CHLORIDE 0.9 % IV SOLN: INTRAVENOUS | Status: DC

## 2020-06-01 NOTE — ED Notes (Signed)
Patient unable to sign for discharge due to mental status. Family left prior to discharge.

## 2020-06-01 NOTE — ED Notes (Addendum)
Billie Ruddy, MD stated it was okay to start tube feeding now due to not receiving feeding last night.

## 2020-06-01 NOTE — ED Notes (Signed)
Lab in room drawing labs at this time.

## 2020-06-01 NOTE — ED Notes (Signed)
Nurse Delilah Shan and myself checked patient bottom and applied cream to patient's bottom at this time

## 2020-06-01 NOTE — ED Notes (Signed)
Dr. Alice Reichert at bedside. States that patient is ready for discharge.

## 2020-06-01 NOTE — Discharge Summary (Addendum)
Physician Discharge Summary   Alex Christensen  male DOB: [birth year]  ZOX:[id number]  PCP: Juluis Pitch, MD  Admit date: 05/31/2020 Discharge date: 06/01/2020  Admitted From: SNF Disposition:  SNF Wife updated at the bedside prior to discharge.  CODE STATUS: Full code   Hospital Course:  For full details, please see H&P, progress notes, consult notes and ancillary notes.  Briefly,  Alex Christensen  is a 79 y.o. Caucasian male with a known history of hypertension and CML, on tube-feed who presented to the emergency room with reported acute onset of generalized weakness and fatigue with altered mental status at [facility] skilled nursing facility where he resides.   # Chronic anemia # Acute GI bleed ruled out Facility reported melena, and blood-tinged sputum x1.  Wife reported pt had a mole above his lips that bled after shaving, and some blood got into pt's mouth.  Pt is on ASA 325 and plavix at home.  Pt's baseline Hgb between 7's-9's.  On presentation, Hgb was 5.9, which was likely erroneous, because after 2u pRBC transfusion, Hgb increased to 10.9 (recheck confirmed).  GI was consulted on presentation and agreed no inpatient GI procedures were necessary.  Recommended PPI for gastric protection.  Generalized weakness and AMS Per wife at the bedside, pt is largely bed-bound and appeared to be at baseline mental status.  Wife reported at baseline, pt does become more lethargic and sleep more at times.    Elevated troponin, ACS ruled out Trop peaked at 189, flat.  EKG no ACS-related changes.  Pt denied chest pain.  No inpatient cardiology consult necessary.  CML  followed by [facility].  Continued on Gleevec.  PEG tube status   Discharge Diagnoses:  Active Problems:   GI bleeding    Discharge Instructions:  Allergies as of 06/01/2020   No Known Allergies     Medication List    TAKE these medications   aspirin 325 MG EC tablet Take 325 mg by mouth  daily.   atorvastatin 10 MG tablet Commonly known as: LIPITOR Take 10 mg by mouth daily at 6 PM.   clopidogrel 75 MG tablet Commonly known as: PLAVIX Take 1 tablet (75 mg total) by mouth daily.   feeding supplement (OSMOLITE 1.5 CAL) Liqd Place 1,000 mLs into feeding tube continuous. feeding supplement (OSMOLITE 1.5 CAL) liquid 700 mL Dose: 700 mL Freq: Every 24 hours Route: PER TUBE Last Dose: Stopped (03/14/20 0058) Start: 03/11/20 1800 Admin Instructions: Provide Osmolite 1.5 Cal at 50 mL/hr x 14 hrs each evening from 1800-0800   feeding supplement (PRO-STAT SUGAR FREE 64) Liqd Take 30 mLs by mouth in the morning and at bedtime.   ferrous sulfate 300 (60 Fe) MG/5ML syrup Take 300 mg by mouth daily.   imatinib 100 MG tablet Commonly known as: GLEEVEC Take 300 mg by mouth daily. Dissolve in water or apple juice, administer immediately.   nystatin cream Commonly known as: MYCOSTATIN Apply 1 application topically 2 (two) times daily. Apply to scrotum and sacral area   pantoprazole 40 MG tablet Commonly known as: Protonix Take 1 tablet (40 mg total) by mouth daily.   traZODone 50 MG tablet Commonly known as: DESYREL Take 75 mg by mouth at bedtime.   zinc oxide 11.3 % Crea cream Commonly known as: BALMEX Apply 1 application topically as directed. Apply to sacrum and groin after each brief change        Follow-up Information    Juluis Pitch, MD. Schedule an appointment  as soon as possible for a visit in 1 week(s).   Specialty: Family Medicine Contact information: [place] [address] [phone]               No Known Allergies   The results of significant diagnostics from this hospitalization (including imaging, microbiology, ancillary and laboratory) are listed below for reference.   Consultations:   Procedures/Studies: CT Head Wo Contrast  Result Date: 06/01/2020 CLINICAL DATA:  Minor head trauma.  Altered behavior for 3 days. EXAM:  CT HEAD WITHOUT CONTRAST TECHNIQUE: Contiguous axial images were obtained from the base of the skull through the vertex without intravenous contrast. COMPARISON:  MRI brain 12/18/2019.  CT head 12/17/2019 FINDINGS: Brain: Diffuse cerebral atrophy. Ventricular dilatation likely representing central atrophy. Low-attenuation changes throughout the deep white matter consistent with small vessel ischemic change. 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 No mass effect or midline shift. No abnormal extra-axial fluid collections. Gray-white matter junctions are distinct. Basal cisterns are not effaced. No acute intracranial hemorrhage. Vascular: Moderate intracranial arterial vascular calcifications. Skull: Calvarium appears intact. Sinuses/Orbits: Paranasal sinuses and mastoid air cells are clear. Other: None. IMPRESSION: 1. No acute intracranial abnormalities. 2. Chronic atrophy and small vessel ischemic changes. 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 Electronically Signed   By: Lucienne Capers M.D.   On: 06/01/2020 00:39   DG Chest Portable 1 View  Result Date: 06/01/2020 CLINICAL DATA:  Weakness. EXAM: PORTABLE CHEST 1 VIEW COMPARISON:  None. FINDINGS: The heart size and mediastinal contours are within normal limits. Both lungs are clear. The visualized skeletal structures are unremarkable. IMPRESSION: No active disease. Electronically Signed   By: Lucienne Capers M.D.   On: 06/01/2020 01:00   DG Femur Min 2 Views Left  Result Date: 06/01/2020 CLINICAL DATA:  Pain after a fall. EXAM: LEFT FEMUR 2 VIEWS COMPARISON:  None. FINDINGS: Degenerative changes in the left hip. No evidence of acute fracture or dislocation of the left femur. No focal bone lesion or bone destruction. Prominent vascular calcifications. IMPRESSION: No acute bony abnormalities. Electronically Signed   By: Lucienne Capers M.D.   On: 06/01/2020 00:59      Labs: BNP (last 3 results) No results for input(s): BNP in the last 8760 hours. Basic Metabolic  Panel: Recent Labs  Lab 05/31/20 2358 06/01/20 0825  NA 128* 129*  K 4.7 4.3  CL 97* 99  CO2 24 22  GLUCOSE 136* 81  BUN 50* 47*  CREATININE 1.14 0.90  CALCIUM 7.7* 8.0*  MG  --  2.1   Liver Function Tests: Recent Labs  Lab 05/31/20 2358  AST 29  ALT 31  ALKPHOS 62  BILITOT 0.5  PROT 4.5*  ALBUMIN 2.1*   No results for input(s): LIPASE, AMYLASE in the last 168 hours. No results for input(s): AMMONIA in the last 168 hours. CBC: Recent Labs  Lab 05/31/20 2358 06/01/20 0825 06/01/20 1049  WBC 12.5* 10.4  --   NEUTROABS 11.1*  --   --   HGB 5.9* 10.9* 10.5*  HCT 17.4* 30.4* 30.3*  MCV 90.6 85.9  --   PLT 345 277  --    Cardiac Enzymes: No results for input(s): CKTOTAL, CKMB, CKMBINDEX, TROPONINI in the last 168 hours. BNP: Invalid input(s): POCBNP CBG: No results for input(s): GLUCAP in the last 168 hours. D-Dimer No results for input(s): DDIMER in the last 72 hours. Hgb A1c No results for input(s): HGBA1C in the last 72 hours. Lipid Profile No results for input(s): CHOL, HDL, LDLCALC,  TRIG, CHOLHDL, LDLDIRECT in the last 72 hours. Thyroid function studies No results for input(s): TSH, T4TOTAL, T3FREE, THYROIDAB in the last 72 hours.  Invalid input(s): FREET3 Anemia work up No results for input(s): VITAMINB12, FOLATE, FERRITIN, TIBC, IRON, RETICCTPCT in the last 72 hours. Urinalysis    Component Value Date/Time   COLORURINE AMBER (A) 03/09/2020 1516   APPEARANCEUR TURBID (A) 03/09/2020 1516   LABSPEC 1.016 03/09/2020 1516   PHURINE 8.0 03/09/2020 1516   GLUCOSEU NEGATIVE 03/09/2020 1516   HGBUR NEGATIVE 03/09/2020 1516   BILIRUBINUR NEGATIVE 03/09/2020 1516   KETONESUR NEGATIVE 03/09/2020 1516   PROTEINUR >=300 (A) 03/09/2020 1516   NITRITE POSITIVE (A) 03/09/2020 1516   LEUKOCYTESUR LARGE (A) 03/09/2020 1516   Sepsis Labs Invalid input(s): PROCALCITONIN,  WBC,  LACTICIDVEN Microbiology Recent Results (from the past 240 hour(s))  Respiratory  Panel by RT PCR (Flu A&B, Covid) - Nasopharyngeal Swab     Status: None   Collection Time: 06/01/20  2:09 AM   Specimen: Nasopharyngeal Swab  Result Value Ref Range Status   SARS Coronavirus 2 by RT PCR NEGATIVE NEGATIVE Final    Comment: (NOTE) SARS-CoV-2 target nucleic acids are NOT DETECTED.  The SARS-CoV-2 RNA is generally detectable in upper respiratoy specimens during the acute phase of infection. The lowest concentration of SARS-CoV-2 viral copies this assay can detect is 131 copies/mL. A negative result does not preclude SARS-Cov-2 infection and should not be used as the sole basis for treatment or other patient management decisions. A negative result may occur with  improper specimen collection/handling, submission of specimen other than nasopharyngeal swab, presence of viral mutation(s) within the areas targeted by this assay, and inadequate number of viral copies (<131 copies/mL). A negative result must be combined with clinical observations, patient history, and epidemiological information. The expected result is Negative.  Fact Sheet for Patients:  PinkCheek.be  Fact Sheet for Healthcare Providers:  GravelBags.it  This test is no t yet approved or cleared by the [place] FDA and  has been authorized for detection and/or diagnosis of SARS-CoV-2 by FDA under an Emergency Use Authorization (EUA). This EUA will remain  in effect (meaning this test can be used) for the duration of the COVID-19 declaration under Section 564(b)(1) of the Act, 21 U.S.C. section 360bbb-3(b)(1), unless the authorization is terminated or revoked sooner.     Influenza A by PCR NEGATIVE NEGATIVE Final   Influenza B by PCR NEGATIVE NEGATIVE Final    Comment: (NOTE) The Xpert Xpress SARS-CoV-2/FLU/RSV assay is intended as an aid in  the diagnosis of influenza from Nasopharyngeal swab specimens and  should not be used as a sole basis  for treatment. Nasal washings and  aspirates are unacceptable for Xpert Xpress SARS-CoV-2/FLU/RSV  testing.  Fact Sheet for Patients: PinkCheek.be  Fact Sheet for Healthcare Providers: GravelBags.it  This test is not yet approved or cleared by the [place] FDA and  has been authorized for detection and/or diagnosis of SARS-CoV-2 by  FDA under an Emergency Use Authorization (EUA). This EUA will remain  in effect (meaning this test can be used) for the duration of the  Covid-19 declaration under Section 564(b)(1) of the Act, 21  U.S.C. section 360bbb-3(b)(1), unless the authorization is  terminated or revoked. Performed at [facility], [address] [place], [place] [address]      Total time spend on discharging this patient, including the last patient exam, discussing the hospital stay, instructions for ongoing care as it relates to  all pertinent caregivers, as well as preparing the medical discharge records, prescriptions, and/or referrals as applicable, is 50 minutes.    Enzo Bi, MD  [facility] 06/01/2020, 3:26 PM  If 7PM-7AM, please contact night-coverage

## 2020-06-01 NOTE — ED Provider Notes (Addendum)
[facility] Emergency Department Provider Note  Time seen: 12:03 AM  I have reviewed the triage vital signs and the nursing notes.   HISTORY  Chief Complaint Weakness   HPI Alex Christensen is a 79 y.o. male with a past medical history of chronic leukemia, hyperlipidemia, anemia, CKD, presents from his nursing facility for generalized weakness.  According to the facility they have noticed some blood-tinged sputum tonight have also noticed the patient to be looking pale and more weak than typical.  EMS states patient was hypotensive initially upon arrival.   Here the patient is awake alert, no acute distress.  Has no acute complaints.  Patient does not exactly know why he is here, cannot contribute to review of systems at this time.  Past Medical History:  Diagnosis Date  . High blood pressure   . Leukemia, chronic myeloid [facility])     Patient Active Problem List   Diagnosis Date Noted  . Protein-calorie malnutrition, severe 03/12/2020  . Pressure injury of skin 03/11/2020  . C. difficile colitis 03/09/2020  . Sepsis secondary to UTI ([place]) 03/09/2020  . Leucocytosis 03/09/2020  . Essential hypertension 12/23/2019  . Hyperlipemia 12/23/2019  . Anemia 12/23/2019  . CKD (chronic kidney disease), stage IIIa 12/23/2019  . 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 Hyponatremia 12/23/2019  . 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[place]) 04/15/2019    Past Surgical History:  Procedure Laterality Date  . TOOTH EXTRACTION      Prior to Admission medications   Medication Sig Start Date End Date Taking? Authorizing Provider  Amino Acids-Protein Hydrolys (FEEDING SUPPLEMENT, PRO-STAT SUGAR FREE 64,) LIQD Take 30 mLs by mouth in the morning and at bedtime.    [provider]  aspirin 325 MG EC tablet Take 325 mg by mouth daily.    [provider]  atorvastatin (LIPITOR) 10 MG tablet Take 10 mg  by mouth daily.  04/09/19 05/18/20  [provider]  clopidogrel (PLAVIX) 75 MG tablet Take 1 tablet (75 mg total) by mouth daily. 12/24/19   Donzetta Starch, NP  ferrous sulfate 324 MG TBEC Take 324 mg by mouth daily.    [provider]  imatinib (GLEEVEC) 100 MG tablet Take 300 mg by mouth daily. Dissolve in water or apple juice, administer immediately.    [provider]  Nutritional Supplements (FEEDING SUPPLEMENT, OSMOLITE 1.5 CAL,) LIQD Place 1,000 mLs into feeding tube continuous. feeding supplement (OSMOLITE 1.5 CAL) liquid 700 mL Dose: 700 mL Freq: Every 24 hours Route: PER TUBE Last Dose: Stopped (03/14/20 0058) Start: 03/11/20 1800 Admin Instructions: Provide Osmolite 1.5 Cal at 50 mL/hr x 14 hrs each evening from 1800-0800 03/15/20   Ralene Muskrat B, MD  ondansetron (ZOFRAN) 4 MG tablet Take 4 mg by mouth every 8 (eight) hours as needed for nausea or vomiting.    [provider]  traZODone (DESYREL) 50 MG tablet Take 75 mg by mouth at bedtime.  04/09/19 05/18/20  [provider]  zinc oxide 20 % ointment Apply 1 application topically 3 (three) times daily as needed for irritation.    [provider]    No Known Allergies  No family history on file.  Social History Social History   Tobacco Use  . Smoking status: Former Research scientist (life sciences)  . Smokeless tobacco: Never Used  Substance Use Topics  . Alcohol use: Yes  . Drug use: Not on file  Review of Systems Unable to obtain adequate/accurate review of systems secondary to baseline mental status versus confusion  ____________________________________________   PHYSICAL EXAM:  VITAL SIGNS: ED Triage Vitals  Enc Vitals Group     BP 05/31/20 2352 106/67     Pulse Rate 05/31/20 2347 (!) 104     Resp 05/31/20 2347 14     Temp --      Temp src --      SpO2 05/31/20 2347 95 %     Weight 05/31/20 2348 94 lb 2.2 oz (42.7 kg)     Height --      Head Circumference --      [facility] Flow --       Pain Score 05/31/20 2348 0     Pain Loc --      Pain Edu? --      Excl. in [place]? --    Constitutional: Patient is awake and alert, lying in bed, no acute distress. Eyes: Normal exam ENT      Head: Normocephalic and atraumatic.      Mouth/Throat: Mucous membranes are moist. Cardiovascular: Normal rate, regular rhythm.  Respiratory: Normal respiratory effort without tachypnea nor retractions. Breath sounds are clear Gastrointestinal: Soft and nontender. No distention.  Musculoskeletal: Nontender with normal range of motion in all extremities.  Neurologic:  Normal speech and language. No gross focal neurologic deficits  Skin:  Skin is warm, pale in appearance Psychiatric: Mood and affect are normal.   ____________________________________________    EKG  EKG viewed and interpreted by myself shows sinus tachycardia 117 bpm with a narrow QRS, normal axis, normal intervals nonspecific ST changes.  No ST elevation.  ____________________________________________    RADIOLOGY  CT head negative for acute abnormality. Femur x-ray negative. Chest x-ray negative  ____________________________________________   INITIAL IMPRESSION / ASSESSMENT AND PLAN / ED COURSE  Pertinent labs & imaging results that were available during my care of the patient were reviewed by me and considered in my medical decision making (see chart for details).   Patient presents emergency department for weakness from his nursing facility also with changes in behavior/mental status.  Patient initially hypotensive per EMS.  Arrives with a blood pressure 106/67 in the emergency department does appear somewhat pale.  Patient is not a great historian and cannot contribute much to his history or review of systems currently.  However given the complaint of generalized weakness and initial hypotension we will check labs including cardiac enzymes.  Per report of blood tinged sputum we will obtain a chest x-ray.  We will dose  500 cc of normal saline and continue to closely monitor while in the emergency department.  We will dose 500 cc of normal saline while obtaining lab work.  Patient did complain of left leg pain.  Great range of motion on my exam.  Patient still is extremely dark in color, strongly guaiac positive consistent with melena.  Patient's labs are resulted with a hemoglobin of 5.9.  Baseline appears to be closer to 9.0.  We will give 1 unit of emergent release blood and additional unit of typed blood.  Spoke to the patient's wife who is in the room she is agreeable plan of care and will consent for the blood products for the patient.  Patient will be admitted to the hospital service for further work-up and treatment.  CRITICAL CARE Performed by: Harvest Dark   Total critical care time: 30 minutes  Critical care time was exclusive of separately billable  procedures and treating other patients.  Critical care was necessary to treat or prevent imminent or life-threatening deterioration.  Critical care was time spent personally by me on the following activities: development of treatment plan with patient and/or surrogate as well as nursing, discussions with consultants, evaluation of patient's response to treatment, examination of patient, obtaining history from patient or surrogate, ordering and performing treatments and interventions, ordering and review of laboratory studies, ordering and review of radiographic studies, pulse oximetry and re-evaluation of patient's condition.   ____________________________________________   FINAL CLINICAL IMPRESSION(S) / ED DIAGNOSES  weakness GI bleed Symptomatic anemia   Harvest Dark, MD 06/01/20 0127    Harvest Dark, MD 06/01/20 0127

## 2020-06-01 NOTE — ED Notes (Signed)
Lab called and notified to draw blood work on patient. Lab verbalized understanding .

## 2020-06-01 NOTE — ED Notes (Signed)
Per MD order, patient is only to receive 2 bags of blood.  Pt has already received both bags of blood.

## 2020-06-01 NOTE — Telephone Encounter (Signed)
This RN called [facility] where patient was discharged to earlier today. This RN spoke with primary RN Wendy Poet at [facility], notified her that patient's MOST form left at ED upon discharge. Per Caryl Pina, leave in envelope and staff from [facility] will come pick up tomorrow. Informed Caryl Pina, RN that this RN will leave in envelope with patient's information at front desk of ED. Caryl Pina, RN states understanding at this time.

## 2020-06-01 NOTE — ED Notes (Signed)
Lab called for blood collect at this time

## 2020-06-01 NOTE — ED Notes (Signed)
Called [facility] for transport to [facility]  [phone]

## 2020-06-01 NOTE — ED Notes (Signed)
Echocardiogram at bedside.

## 2020-06-01 NOTE — Progress Notes (Signed)
*  PRELIMINARY RESULTS* Echocardiogram 2D Echocardiogram has been performed.  Sherrie Sport 06/01/2020, 8:57 AM

## 2020-06-01 NOTE — ED Notes (Signed)
Lab called to collect final blood work.

## 2020-06-01 NOTE — ED Notes (Signed)
Turned patient on his side and checked his bottom ,his bottom is red applied cream at this time

## 2020-06-01 NOTE — ED Notes (Signed)
Lab at bedside

## 2020-06-01 NOTE — ED Notes (Signed)
Report given to Ashley,RN at [facility]. RN aware that nurse will be coming back to facility.

## 2020-06-01 NOTE — Progress Notes (Signed)
   [facility] cardiology consulted for elevated troponin on Alex Christensen 06/01/2020.    On review of EMR, the patient has followed with Dr. Nehemiah Massed of [facility] with last office visit 06/2019.    Message sent to internal medicine provider to request clarification regarding if the patient specifically requested [facility] & pending response.  Signed, Arvil Chaco, PA-C 06/01/2020, 9:10 AM

## 2020-06-01 NOTE — Consult Note (Signed)
GI Inpatient Consult Note  Reason for Consult: Potential GI bleed   Attending Requesting Consult: Dr. Enzo Bi, MD  History of Present Illness: Alex Christensen is a 79 y.o. male seen for evaluation of possible GI bleed at the request of Dr. Enzo Bi. Patient has a PMH of HTN, HLD, CKD Stage IIIa, Hx of TIA, CML, c difficile colitis. He presented to the [facility] via EMS from [facility] around midnight last night after staff reported altered mental status x 3 days and generalized weakness. Per staff at [facility], they noticed blood-tinged sputum last night and patient appearing paler than normal. He was hypotensive upon EMS arrival to [facility], but BP improved upon presentation to the [facility] ED. Blood work was significant for hemoglobin 5.9, hematocrit 17.4 which was decreased from his baseline hemoglobin of 9.7. High-sensitivity troponin I were elevated and cardiology was consulted and echo ordered. He had portable chest x-ray, noncontrasted CT head showed no acute changes. He was started on Protonix, given 500 mL IV normal saline bolus, and received 2 units pRBCs. Per ED physician, stool was dark in color and was guaiac positive. Blood was rechecked and patient had hemoglobin 10.9 this morning and was 10.5 around 1100. He has no overt gastrointestinal blood loss since being here in the ED. Nursing reports no bowel movements. He is on aspirin, Plavix, and Gleevec at home. No history of GI bleeding. He does take oral iron supplement daily. Wife who helps with history is present in the room during examination. She reports he did have a mole shaved on his right cheek which did cause bleeding last night with some blood getting into his mouth. Patient denies any nausea, vomiting, dysphagia, odynophagia, early satiety, or epigastric abdominal pain. He has never had an endoscopy or colonoscopy. No known family history of colon cancer, adenomatous polyps, or IBD. He does have a PEG tube in place for feeding and has been receiving  nutrition though his PEG. He is followed by palliative care as an outpatient. Wife is not wanting aggressive or invasive GI procedures if not medically necessary.     Past Medical History:  Past Medical History:  Diagnosis Date  . High blood pressure   . Leukemia, chronic myeloid ([place])     Problem List: Patient Active Problem List   Diagnosis Date Noted  . GI bleeding 06/01/2020  . Protein-calorie malnutrition, severe 03/12/2020  . Pressure injury of skin 03/11/2020  . C. difficile colitis 03/09/2020  . Sepsis secondary to UTI ([place]) 03/09/2020  . Leucocytosis 03/09/2020  . Essential hypertension 12/23/2019  . Hyperlipemia 12/23/2019  . Anemia 12/23/2019  . CKD (chronic kidney disease), stage IIIa 12/23/2019  . 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 Hyponatremia 12/23/2019  . 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[place]) 04/15/2019    Past Surgical History: Past Surgical History:  Procedure Laterality Date  . TOOTH EXTRACTION      Allergies: No Known Allergies  Home Medications: (Not in a hospital admission)  Home medication reconciliation was completed with the patient.   Scheduled Inpatient Medications:   . atorvastatin  10 mg Oral Daily  . feeding supplement (PRO-STAT SUGAR FREE 64)  30 mL Oral Daily  . ferrous sulfate  325 mg Oral Daily  . traZODone  75 mg Oral QHS    Continuous Inpatient Infusions:   . sodium chloride    . sodium chloride    . sodium chloride 100 mL/hr at 06/01/20  2878  . feeding supplement (OSMOLITE 1.5 CAL)    . pantoprozole (PROTONIX) infusion 8 mg/hr (06/01/20 1406)    PRN Inpatient Medications:  acetaminophen **OR** acetaminophen, ondansetron **OR** ondansetron (ZOFRAN) IV, ondansetron, traZODone  Family History: family history is not on file.  The patient's family history is negative for inflammatory bowel disorders, GI malignancy, or solid organ  transplantation.  Social History:   reports that he has quit smoking. He has never used smokeless tobacco. He reports current alcohol use. The patient denies ETOH, tobacco, or drug use.   Review of Systems: Constitutional: Weight is stable.  Eyes: No changes in vision. ENT: No oral lesions, sore throat.  GI: see HPI.  Heme/Lymph: No easy bruising.  CV: No chest pain.  GU: No hematuria.  Integumentary: No rashes.  Neuro: No headaches.  Psych: No depression/anxiety.  Endocrine: No heat/cold intolerance.  Allergic/Immunologic: No urticaria.  Resp: No cough, SOB.  Musculoskeletal: No joint swelling.    Physical Examination: BP 117/66 (BP Location: Left Arm)   Pulse 100   Temp 97.7 F (36.5 C) (Axillary)   Resp 16   Wt 42.7 kg   SpO2 100%   BMI 15.67 kg/m  Chronically ill-appearing male in ED stretcher. Answers questions. Wife present in room.  Gen: NAD, alert and oriented x 4 HEENT: PEERLA, EOMI, Neck: supple, no JVD or thyromegaly Chest: CTA bilaterally, no wheezes, crackles, or other adventitious sounds CV: RRR, no m/g/c/r Abd: soft, ND, +BS in all four quadrants; PEG in place in LUQ, no HSM, guarding, ridigity, or rebound tenderness Ext: no edema, well perfused with 2+ pulses, Skin: no rash or lesions noted Lymph: no LAD  Data: Lab Results  Component Value Date   WBC 10.4 06/01/2020   HGB 10.5 (L) 06/01/2020   HCT 30.3 (L) 06/01/2020   MCV 85.9 06/01/2020   PLT 277 06/01/2020   Recent Labs  Lab 05/31/20 2358 06/01/20 0825 06/01/20 1049  HGB 5.9* 10.9* 10.5*   Lab Results  Component Value Date   NA 129 (L) 06/01/2020   K 4.3 06/01/2020   CL 99 06/01/2020   CO2 22 06/01/2020   BUN 47 (H) 06/01/2020   CREATININE 0.90 06/01/2020   Lab Results  Component Value Date   ALT 31 05/31/2020   AST 29 05/31/2020   ALKPHOS 62 05/31/2020   BILITOT 0.5 05/31/2020   No results for input(s): APTT, INR, PTT in the last 168 hours. Assessment/Plan:  79 y/o  Caucasian male with a PMH of HTN, HLD, CKD Stage IIIa, Hx of TIA, CML, Hx c difficile colitis 01/2020 presented to the [facility] ED via EMS from [facility] late last night for altered mental status and generalized weakness  1. Possible GI bleed/acute on chronic anemia  2. Heme positive stool  3. Chronic antiplatelet therapy - on ASA and Plavix at home  4. CML - followed by [facility]. On Gleevec which has side effect of potential GI bleed  5. Elevated troponins - likely demand ischemia. Cardiology consult pending. Echo today.  6. PEG tube status  Recommendations:  -No overt gastrointestinal blood loss in over 14 hours since being in the ED and specifically no melena or hematochezia -He is s/p 2 units pRBCs with improvements in hemoglobin from 5.9 to 10.9 which is higher than would be suspected after just 2 units. It is possible the initial hemoglobin was sampling error, erroneous result, or he had self-limited bleed -Discussed with patient and wife that definitive evaluation with EGD and  colonoscopy could be considered for endoluminal evaluation and diagnostic purposes, but patient and wife decline any invasive evaluation at this time given lack of overt GI hemorrhage -No plans for EGD and colonoscopy at this time -Patient should be started on Protonix 40 mg daily for gastric protection -Resume ASA, Plavix, and Gleevec if medically necessary -If he develops overt melena, hematochezia, or symptoms of symptomatic anemia, he was advised to present immediately to the ED   Patient and wife agree with above plan of care. All of their questions were answered.   Thank you for the consult. Please call with questions or concerns.  Reeves Forth [facility] [phone] (Cell)

## 2020-06-01 NOTE — Care Management CC44 (Signed)
Condition Code 44 Documentation Completed  Patient Details  Name: Alex Christensen MRN: [id number] Date of Birth: 1941-02-01   Condition Code 44 given:  Yes Patient signature on Condition Code 44 notice:  Yes Documentation of 2 MD's agreement:  Yes Code 44 added to claim:  Yes   Patient with 1:1 sitter in room, spoke with wife [name].   Victorino Dike, RN 06/01/2020, 4:01 PM

## 2020-06-01 NOTE — H&P (Addendum)
Alex Christensen   PATIENT NAME: Alex Christensen    MR#:  [id number]  DATE OF BIRTH:  May 08, 1941  DATE OF ADMISSION:  05/31/2020  PRIMARY CARE PHYSICIAN: Juluis Pitch, MD   REQUESTING/REFERRING PHYSICIAN: Harvest Dark, MD  CHIEF COMPLAINT:   Chief Complaint  Patient presents with   Weakness    HISTORY OF PRESENT ILLNESS:  Alex Christensen  is a 79 y.o. Caucasian male with a known history of hypertension and CML, presented to the emergency room with acute onset of generalized weakness and fatigue with altered mental status at [facility] facility where he resides. He has been noted to have melena and was found to be anemic in the ER. He did not have any reported chest pain or dyspnea or cough or wheezing or hemoptysis. No dysuria, oliguria or hematuria or flank pain. Is a very poor historian. He had brief run of asymptomatic  ventricular tachycardia in the emergency room.  Upon presentation to the emergency room, labs revealed CBC with leukocytosis of 12.5 and anemia with hemoglobin of 5.9 hematocrit 17.4 compared to 7.3 and 21.8 on 03/13/2020. High-sensitivity troponin I was 158 and later 147. CMP was remarkable for albumin of 2.1 with total protein 4.5 and a BUN of 50 with a creatinine of 1.14 with sodium 128 and chloride 97.EKG showed sinus tachycardia with rate 117 with incomplete right bundle branch block and low voltage QRS. Portable chest ray showed no acute cardiopulmonary disease. 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 There were no intracranial abnormalities. Left femur x-ray showed no acute bony abnormalities.  The patient was given 500 mill IV normal saline bolus and 80 mg of IV Protonix bolus followed by IV Protonix drip. He will be admitted to a progressive unit bed for further evaluation and management. 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 Unobtainable as the patient was almost nonverbal  DRUG ALLERGIES:  No Known Allergies  REVIEW OF SYSTEMS:   As per history of present illness. All pertinent systems were reviewed above. Constitutional, HEENT, cardiovascular, respiratory, GI, GU, musculoskeletal, neuro, psychiatric, endocrine, integumentary and hematologic systems were reviewed and are otherwise negative/unremarkable except for positive findings mentioned above in the HPI.   MEDICATIONS AT HOME:   Prior to Admission medications   Medication Sig Start Date End Date Taking? Authorizing Provider  Amino Acids-Protein Hydrolys (FEEDING SUPPLEMENT, PRO-STAT SUGAR FREE 64,) LIQD Take 30 mLs by mouth in the morning and at bedtime.   Yes [provider]  aspirin 325 MG EC tablet Take 325 mg by mouth daily.   Yes [provider]  atorvastatin (LIPITOR) 10 MG tablet Take 10 mg by mouth daily at 6 PM.   Yes [provider]  clopidogrel (PLAVIX) 75 MG tablet Take 1 tablet (75 mg total) by mouth daily. 12/24/19  Yes Donzetta Starch, NP  ferrous sulfate 300 (60 Fe) MG/5ML syrup Take 300 mg by mouth daily.  Yes [provider]  imatinib (GLEEVEC) 100 MG tablet Take 300 mg by mouth daily. Dissolve in water or apple juice, administer immediately.   Yes [provider]  nystatin cream (MYCOSTATIN) Apply 1 application topically 2 (two) times daily. Apply to scrotum and sacral area   Yes [provider]  traZODone (DESYREL) 50 MG tablet Take 75 mg by mouth at bedtime.   Yes [provider]  zinc oxide (BALMEX)  11.3 % CREA cream Apply 1 application topically as directed. Apply to sacrum and groin after each brief change   Yes [provider]  Nutritional Supplements (FEEDING SUPPLEMENT, OSMOLITE 1.5 CAL,) LIQD Place 1,000 mLs into feeding tube continuous. feeding supplement (OSMOLITE 1.5 CAL) liquid 700 mL Dose: 700 mL Freq: Every 24 hours Route: PER TUBE Last Dose: Stopped (03/14/20 0058) Start: 03/11/20 1800 Admin Instructions: Provide Osmolite 1.5 Cal at 50 mL/hr x 14 hrs each evening from 1800-0800 03/15/20   Ralene Muskrat B, MD      VITAL SIGNS:  Blood pressure 112/62, pulse 84, temperature 97.7 F (36.5 C), temperature source Axillary, resp. rate 14, weight 42.7 kg, SpO2 95 %.  PHYSICAL EXAMINATION:  Physical Exam  GENERAL:  79 y.o.-year-old Caucasian male patient lying in the bed with no acute distress.  EYES: Pupils equal, round, reactive to light and accommodation. Positive pallor. No scleral icterus. Extraocular muscles intact.  HEENT: Head atraumatic, normocephalic. Oropharynx and nasopharynx clear.  NECK:  Supple, no jugular venous distention. No thyroid enlargement, no tenderness.  LUNGS: Normal breath sounds bilaterally, no wheezing, rales,rhonchi or crepitation. No use of accessory muscles of respiration.  CARDIOVASCULAR: Regular rate and rhythm, S1, S2 normal. No murmurs, rubs, or gallops.  ABDOMEN: Soft, nondistended, nontender. Bowel sounds present. No organomegaly or mass.  EXTREMITIES: No pedal edema, cyanosis, or clubbing.  NEUROLOGIC: Cranial nerves II through XII are intact. Muscle strength 5/5 in all extremities. Sensation intact. Gait not checked.  PSYCHIATRIC: The patient is fairly somnolent but arousable. He has not answered any questions. He has not been cooperative with exam. No good eye contact. SKIN: No obvious rash, lesion, or ulcer.   LABORATORY PANEL:   CBC Recent Labs  Lab 05/31/20 2358  WBC 12.5*  HGB 5.9*  HCT 17.4*  PLT 345    ------------------------------------------------------------------------------------------------------------------  Chemistries  Recent Labs  Lab 05/31/20 2358  NA 128*  K 4.7  CL 97*  CO2 24  GLUCOSE 136*  BUN 50*  CREATININE 1.14  CALCIUM 7.7*  AST 29  ALT 31  ALKPHOS 62  BILITOT 0.5   ------------------------------------------------------------------------------------------------------------------  Cardiac Enzymes No results for input(s): TROPONINI in the last 168 hours. ------------------------------------------------------------------------------------------------------------------  RADIOLOGY:  CT Head Wo Contrast  Result Date: 06/01/2020 CLINICAL DATA:  Minor head trauma.  Altered behavior for 3 days. EXAM: CT HEAD WITHOUT CONTRAST TECHNIQUE: Contiguous axial images were obtained from the base of the skull through the vertex without intravenous contrast. COMPARISON:  MRI brain 12/18/2019.  CT head 12/17/2019 FINDINGS: Brain: Diffuse cerebral atrophy. Ventricular dilatation likely representing central atrophy. Low-attenuation changes throughout the deep white matter consistent with small vessel ischemic change. 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 No mass effect or midline shift. No abnormal extra-axial fluid collections. Gray-white matter junctions are distinct. Basal cisterns are not effaced. No acute intracranial hemorrhage. Vascular: Moderate intracranial arterial vascular calcifications. Skull: Calvarium appears intact. Sinuses/Orbits: Paranasal sinuses and mastoid air cells are clear. Other: None. IMPRESSION: 1. No acute intracranial abnormalities. 2. Chronic atrophy and small vessel ischemic changes. Multiple  old lacunar infarcts. Electronically Signed   By: Lucienne Capers M.D.   On: 06/01/2020 00:39   DG Chest Portable 1 View  Result Date: 06/01/2020 CLINICAL DATA:  Weakness. EXAM: PORTABLE CHEST 1 VIEW COMPARISON:  None. FINDINGS: The heart size and mediastinal  contours are within normal limits. Both lungs are clear. The visualized skeletal structures are unremarkable. IMPRESSION: No active disease. Electronically Signed   By: Lucienne Capers M.D.   On: 06/01/2020 01:00   DG Femur Min 2 Views Left  Result Date: 06/01/2020 CLINICAL DATA:  Pain after a fall. EXAM: LEFT FEMUR 2 VIEWS COMPARISON:  None. FINDINGS: Degenerative changes in the left hip. No evidence of acute fracture or dislocation of the left femur. No focal bone lesion or bone destruction. Prominent vascular calcifications. IMPRESSION: No acute bony abnormalities. Electronically Signed   By: Lucienne Capers M.D.   On: 06/01/2020 00:59      IMPRESSION AND PLAN:   1. GI bleeding with subsequent acute blood loss anemia on top of chronic anemia. -The patient will be admitted to a progressive unit bed. -He was typed and crossmatch and will be transfused 2 units of packed red blood cells. -We will follow serial hemoglobins and hematocrits and posttransfusion H&H. -We will continue him on IV Protonix drip. -We will stop aspirin, Plavix and Gleevec given GI bleeding. --I notified Dr. Vicente Males about the patient.  2. Elevated troponin I. -He could be having myocardial strain secondary to his severe anemia and differential diagnosis would include non-ST elevation MI. -2D echo and cardiology consultation will be obtained. -I notified Dr. Percival Spanish about the patient. -We will check potassium and magnesium especially given his brief run of ventricular tachycardia. We will optimize both.  3. Hyponatremia and hypochloremia, likely hypovolemic. -The patient will be hydrated with IV normal saline and will follow his BMP P.  4. Protein energy malnutrition as manifested by hypoalbuminemia. -Dietitian consult will be obtained during hospitalization and nutritional optimization when he is not n.p.o.  5. Dyslipidemia. -Statin therapy will be resumed..   All the records are reviewed and case discussed  with ED provider. The plan of care was discussed in details with the patient (and family). I answered all questions. The patient agreed to proceed with the above mentioned plan. Further management will depend upon hospital course.   CODE STATUS: Full code  Status is: Inpatient  Remains inpatient appropriate because:Ongoing diagnostic testing needed not appropriate for outpatient work up, Unsafe d/c plan, IV treatments appropriate due to intensity of illness or inability to take PO and Inpatient level of care appropriate due to severity of illness   Dispo: The patient is from: SNF              Anticipated d/c is to: SNF              Anticipated d/c date is: 3 days              Patient currently is not medically stable to d/c.   TOTAL TIME TAKING CARE OF THIS PATIENT: 55 minutes.    Christel Mormon M.D on 06/01/2020 at 3:44 AM  [facility]   From 7 PM-7 AM, contact night-coverage [email or web address]  CC: Primary care physician; Juluis Pitch, MD

## 2020-06-01 NOTE — ED Notes (Signed)
GI at bedside

## 2020-06-01 NOTE — ED Notes (Signed)
Checked patient bottom and turned patient on his side.

## 2020-06-02 LAB — BPAM RBC
Blood Product Expiration Date: 202111052359
Blood Product Expiration Date: 202111092359
Blood Product Expiration Date: 202111092359
ISSUE DATE / TIME: 202110200108
ISSUE DATE / TIME: 202110200222
Unit Type and Rh: 5100
Unit Type and Rh: 5100
Unit Type and Rh: 5100

## 2020-06-02 LAB — TYPE AND SCREEN
ABO/RH(D): O POS
Antibody Screen: NEGATIVE
Unit division: 0
Unit division: 0
Unit division: 0

## 2020-06-02 LAB — PREPARE RBC (CROSSMATCH)

## 2020-06-02 LAB — ABO/RH: ABO/RH(D): O POS

## 2020-06-10 ENCOUNTER — Telehealth: Payer: Self-pay | Admitting: Internal Medicine

## 2020-06-10 DIAGNOSIS — C9210 Chronic myeloid leukemia, BCR/ABL-positive, not having achieved remission: Secondary | ICD-10-CM

## 2020-06-10 NOTE — Telephone Encounter (Signed)
LVM for pt's wife to discuss recent request for appointment.  Left message to call us back.  C-schedule appointment next week-MD; labs-CBC CMP LDH; BCR ABL PCR; BCR- ABL- FISH panel.

## 2020-06-13 NOTE — Addendum Note (Signed)
Addended by: Gloris Ham on: 06/13/2020 09:10 AM   Modules accepted: Orders

## 2020-06-16 ENCOUNTER — Other Ambulatory Visit: Payer: Self-pay

## 2020-06-16 ENCOUNTER — Telehealth: Payer: Self-pay | Admitting: Internal Medicine

## 2020-06-16 DIAGNOSIS — C9210 Chronic myeloid leukemia, BCR/ABL-positive, not having achieved remission: Secondary | ICD-10-CM

## 2020-06-16 DIAGNOSIS — D649 Anemia, unspecified: Secondary | ICD-10-CM

## 2020-06-16 NOTE — Telephone Encounter (Signed)
On 11/04- LVM for pt's wife. GB

## 2020-06-17 ENCOUNTER — Inpatient Hospital Stay: Payer: Medicare Other

## 2020-06-17 ENCOUNTER — Inpatient Hospital Stay: Payer: Medicare Other | Admitting: Internal Medicine

## 2020-06-21 ENCOUNTER — Telehealth: Payer: Self-pay | Admitting: Internal Medicine

## 2020-06-21 NOTE — Telephone Encounter (Signed)
On 11/04-Long discussion with the patient's wife. Patient continues to decline in health; at the nursing home. Wife is concerned about his follow-ups with the cancer center as he is too weak for appointments.  Discussed with the wife that I will not be able to prescribe Gleevec [even if he has the labs done at the nursing home] unless I get to see the patient periodically. She will reach out to nursing home/NP/PCP if they could prescribe Gleevec.  She will call us if questions or concerns.  GB

## 2020-06-27 ENCOUNTER — Telehealth: Payer: Self-pay | Admitting: Internal Medicine

## 2020-06-27 ENCOUNTER — Other Ambulatory Visit: Payer: Self-pay | Admitting: Internal Medicine

## 2020-06-27 DIAGNOSIS — C9210 Chronic myeloid leukemia, BCR/ABL-positive, not having achieved remission: Secondary | ICD-10-CM

## 2020-06-27 NOTE — Telephone Encounter (Signed)
I will NOT refill gleevec as pt in Nursing home;and as per my discussion with pt's wife- not able to keep appts with me in cancer center.   If pt needs gleevec medication this will have to be prescribed by his SNF MD/NP; I will be happy to talk to them if needed. This was clearly relayed to pt's wife [name] over the phone couple of weeks ago.  GB

## 2020-07-01 ENCOUNTER — Non-Acute Institutional Stay: Payer: Medicare Other | Admitting: Primary Care

## 2020-07-01 ENCOUNTER — Other Ambulatory Visit: Payer: Self-pay

## 2020-07-01 DIAGNOSIS — C9210 Chronic myeloid leukemia, BCR/ABL-positive, not having achieved remission: Secondary | ICD-10-CM

## 2020-07-01 DIAGNOSIS — E43 Unspecified severe protein-calorie malnutrition: Secondary | ICD-10-CM

## 2020-07-01 DIAGNOSIS — Z515 Encounter for palliative care: Secondary | ICD-10-CM

## 2020-07-01 NOTE — Progress Notes (Signed)
[facility] Pall[facility] Consult Note Telephone: [phone]  Fax: [phone]     Date of encounter: 07/01/20 PATIENT NAME: Alex Christensen [address] [place] [address] [phone] (home)  DOB: 1941-01-26 MRN: [id number]  PRIMARY CARE PROVIDER:    Rica Koyanagi, MD [place] [address] [phone]   REFERRING PROVIDER:   Rica Koyanagi, MD [place] [address] [phone]   RESPONSIBLE PARTY:   Extended Emergency Contact Information Primary Emergency Contact: Alex Christensen Mobile Phone: [phone] Relation: Spouse Secondary Emergency Contact: Alex, Christensen Mobile Phone: [phone] Relation: Daughter  I met face to face with patient and family in facility. Palliative Care was asked to follow this patient by consultation request of Alex Koyanagi, MD  to help address advance care planning and goals of care. This is a follow up  visit.   ASSESSMENT AND RECOMMENDATIONS:   1. Advance Care Planning/Goals of Care: Goals include to maximize quality of life and symptom management. Our advance care planning conversation included a discussion about:     The value and importance of advance care planning   Experiences with loved ones who have been seriously ill or have died   Exploration of personal, cultural or spiritual beliefs that might influence medical decisions   Exploration of goals of care in the event of a sudden injury or illness   Identification and preparation of a healthcare agent   Review and updating or creation of an  advance directive document .  Decision  de-escalate disease focused treatments due to poor prognosis.  Met with patient spouse RE her ongoing medical decisions for her husband. He has been on chemo but due to not being able to get to clinic for monitoring, it's advised he stop the medications. I discussed that his chances of benefiting from this  medication are growing smaller and smaller, and that his frailty and debility are more the issues to focus on now. She concurred that he was getting more debilitated and she wanted to spare him going out to multiple appts that would not have significant benefit. She volunteered she also declined the GI consult she'd asked for RE the fecal transplant.   We discussed hospice vs palliative services and the COP for hospice program. Palliative could follow no matter medical decision but hospice focused at end of life care and the natural progression with out artificially extending life or hastening death. We discussed his tube feedings as being less and less effective as he is not 76 lbs, down from his weight 2 months ago of 118 lbs. This is a 35% loss and needs to be verified. It appears subjectively to be accurate. We discussed his weight loss and debility in spite of tube feedings due to geriatric syndrome of decline. She is going to consider some changes in his care, one being d/c of Gleevec.   Discussed with Dr. Livia Snellen RE recommendation to d/c Gleevec.  2. Symptom Management:   Nutrition: Continues to decline rapidly with wt loss at 35% in 2-3 months.Severe protein calorie malnutrition.  Recommend hospice services in place.  3. Follow up Palliative Care Visit: Palliative care will continue to follow for goals of care clarification and symptom management. Return 2-3 weeks or prn.  4. Family /Caregiver/Community Supports: Wife is POA. Has daughters who live in [place] but will visit soon. In LTC.  5. Cognitive / Functional decline: A and O x 1, very confused, dependent in all adls and iadls.  I spent 60 minutes providing this consultation,  from 1100 to 1200. More than 50% of the time in this consultation was spent coordinating communication.   CODE STATUS: FULL CODE.  PPS: 30%  HOSPICE ELIGIBILITY/DIAGNOSIS: yes/Severe weight loss /malnutrition  Subjective:  CHIEF COMPLAINT: weight  loss  HISTORY OF PRESENT ILLNESS:  Alex Christensen is a 79 y.o. year old male  with weight loss in spite of tube feedings. Taking small amount po but has been in decline with weight loss for some months.   We are asked to consult around clarification of medical management decisions and advance care planning.    History obtained from review of EMR, discussion with primary team, and  interview with family, caregiver  and/or Alex Christensen. Records reviewed and summarized above.   CURRENT PROBLEM LIST:  Patient Active Problem List   Diagnosis Date Noted  . GI bleeding 06/01/2020  . Protein-calorie malnutrition, severe 03/12/2020  . Pressure injury of skin 03/11/2020  . C. difficile colitis 03/09/2020  . Sepsis secondary to UTI ([place]) 03/09/2020  . Leucocytosis 03/09/2020  . Essential hypertension 12/23/2019  . Hyperlipemia 12/23/2019  . Anemia 12/23/2019  . CKD (chronic kidney disease), stage IIIa 12/23/2019  . 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 Hyponatremia 12/23/2019  . 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[place]) 04/15/2019   PAST MEDICAL HISTORY:  Active Ambulatory Problems    Diagnosis Date Noted  . 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[place]) 04/15/2019  . 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 Essential hypertension 12/23/2019  . Hyperlipemia 12/23/2019  . Anemia 12/23/2019  . CKD (chronic kidney disease), stage IIIa 12/23/2019  . 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 Hyponatremia 12/23/2019  . C. difficile colitis 03/09/2020  . Sepsis secondary to UTI ([place]) 03/09/2020  . Leucocytosis 03/09/2020  . Pressure injury of skin 03/11/2020  . Protein-calorie malnutrition, severe 03/12/2020  . GI bleeding 06/01/2020   Resolved Ambulatory Problems    Diagnosis Date Noted  . No Resolved Ambulatory Problems   Past Medical History:  Diagnosis Date  . High blood pressure   . Leukemia, chronic  myeloid ([place])    SOCIAL HX:  Social History   Tobacco Use  . Smoking status: Former Research scientist (life sciences)  . Smokeless tobacco: Never Used  Substance Use Topics  . Alcohol use: Yes   FAMILY HX: No family history on file.  ALLERGIES: No Known Allergies   PERTINENT MEDICATIONS:  Outpatient Encounter Medications as of 07/01/2020  Medication Sig  . Amino Acids-Protein Hydrolys (FEEDING SUPPLEMENT, PRO-STAT SUGAR FREE 64,) LIQD Take 30 mLs by mouth in the morning and at bedtime.  Marland Kitchen aspirin 325 MG EC tablet Take 325 mg by mouth daily.  Marland Kitchen atorvastatin (LIPITOR) 10 MG tablet Take 10 mg by mouth daily at 6 PM.  . clopidogrel (PLAVIX) 75 MG tablet Take 1 tablet (75 mg total) by mouth daily.  . ferrous sulfate 300 (60 Fe) MG/5ML syrup Take 300 mg by mouth daily.  Marland Kitchen imatinib (GLEEVEC) 100 MG tablet Take 300 mg by mouth daily. Dissolve in water or apple juice, administer immediately.  . Nutritional Supplements (FEEDING SUPPLEMENT, OSMOLITE 1.5 CAL,) LIQD Place 1,000 mLs into feeding tube continuous. feeding supplement (OSMOLITE 1.5 CAL) liquid 700 mL Dose: 700 mL Freq: Every 24 hours Route: PER TUBE Last Dose: Stopped (03/14/20 0058) Start: 03/11/20 1800 Admin Instructions: Provide Osmolite 1.5 Cal at 50 mL/hr x 14 hrs each evening from  1800-0800  . nystatin cream (MYCOSTATIN) Apply 1 application topically 2 (two) times daily. Apply to scrotum and sacral area  . pantoprazole (PROTONIX) 40 MG tablet Take 1 tablet (40 mg total) by mouth daily.  . traZODone (DESYREL) 50 MG tablet Take 75 mg by mouth at bedtime.  Marland Kitchen zinc oxide (BALMEX) 11.3 % CREA cream Apply 1 application topically as directed. Apply to sacrum and groin after each brief change   No facility-administered encounter medications on file as of 07/01/2020.    Objective: ROS Deferred  Thank you for the opportunity to participate in the care of Mr. Sherwin.  The palliative care team will continue to follow. Please call our office at [phone] if we  can be of additional assistance.  Jason Coop, NP , DNP, MPH, AGPCNP-BC, ACHPN  COVID-19 PATIENT SCREENING TOOL  Person answering questions: ____________staff______ _____   1.  Is the patient or any family member in the home showing any signs or symptoms regarding respiratory infection?               Person with Symptom- __________NA_________________  a. Fever                                                                          Yes___ No___          ___________________  b. Shortness of breath                                                    Yes___ No___          ___________________ c. Cough/congestion                                       Yes___  No___         ___________________ d. Body aches/pains                                                         Yes___ No___        ____________________ e. Gastrointestinal symptoms (diarrhea, nausea)           Yes___ No___        ____________________  2. Within the past 14 days, has anyone living in the home had any contact with someone with or under investigation for COVID-19?    Yes___ No_X_   Person __________________

## 2020-07-08 ENCOUNTER — Emergency Department: Payer: Medicare Other

## 2020-07-08 ENCOUNTER — Other Ambulatory Visit: Payer: Self-pay

## 2020-07-08 ENCOUNTER — Encounter: Payer: Self-pay | Admitting: Emergency Medicine

## 2020-07-08 ENCOUNTER — Emergency Department
Admission: EM | Admit: 2020-07-08 | Discharge: 2020-07-08 | Disposition: A | Discharge status: Home / Self Care | Payer: Medicare Other | Attending: Emergency Medicine | Admitting: Emergency Medicine

## 2020-07-08 DIAGNOSIS — Z8673 Personal history of transient ischemic attack (TIA), and cerebral infarction without residual deficits: Secondary | ICD-10-CM | POA: Insufficient documentation

## 2020-07-08 DIAGNOSIS — R0602 Shortness of breath: Secondary | ICD-10-CM

## 2020-07-08 DIAGNOSIS — N1831 Chronic kidney disease, stage 3a: Secondary | ICD-10-CM | POA: Diagnosis not present

## 2020-07-08 DIAGNOSIS — Z7982 Long term (current) use of aspirin: Secondary | ICD-10-CM | POA: Insufficient documentation

## 2020-07-08 DIAGNOSIS — Z87891 Personal history of nicotine dependence: Secondary | ICD-10-CM | POA: Diagnosis not present

## 2020-07-08 DIAGNOSIS — R0603 Acute respiratory distress: Secondary | ICD-10-CM | POA: Diagnosis present

## 2020-07-08 DIAGNOSIS — R0600 Dyspnea, unspecified: Secondary | ICD-10-CM | POA: Insufficient documentation

## 2020-07-08 DIAGNOSIS — Z20822 Contact with and (suspected) exposure to covid-19: Secondary | ICD-10-CM | POA: Insufficient documentation

## 2020-07-08 DIAGNOSIS — I129 Hypertensive chronic kidney disease with stage 1 through stage 4 chronic kidney disease, or unspecified chronic kidney disease: Secondary | ICD-10-CM | POA: Diagnosis not present

## 2020-07-08 LAB — URINALYSIS, COMPLETE (UACMP) WITH MICROSCOPIC
Bacteria, UA: NONE SEEN
Bilirubin Urine: NEGATIVE
Glucose, UA: NEGATIVE mg/dL
Hgb urine dipstick: NEGATIVE
Ketones, ur: NEGATIVE mg/dL
Leukocytes,Ua: NEGATIVE
Nitrite: NEGATIVE
Protein, ur: NEGATIVE mg/dL
Specific Gravity, Urine: 1.041 — ABNORMAL HIGH (ref 1.005–1.030)
pH: 9 — ABNORMAL HIGH (ref 5.0–8.0)

## 2020-07-08 LAB — CBC
HCT: 27.1 % — ABNORMAL LOW (ref 39.0–52.0)
Hemoglobin: 9.1 g/dL — ABNORMAL LOW (ref 13.0–17.0)
MCH: 31.2 pg (ref 26.0–34.0)
MCHC: 33.6 g/dL (ref 30.0–36.0)
MCV: 92.8 fL (ref 80.0–100.0)
Platelets: 379 10*3/uL (ref 150–400)
RBC: 2.92 MIL/uL — ABNORMAL LOW (ref 4.22–5.81)
RDW: 15.2 % (ref 11.5–15.5)
WBC: 19.3 10*3/uL — ABNORMAL HIGH (ref 4.0–10.5)
nRBC: 0 % (ref 0.0–0.2)

## 2020-07-08 LAB — RESP PANEL BY RT-PCR (FLU A&B, COVID) ARPGX2
Influenza A by PCR: NEGATIVE
Influenza B by PCR: NEGATIVE
SARS Coronavirus 2 by RT PCR: NEGATIVE

## 2020-07-08 LAB — COMPREHENSIVE METABOLIC PANEL
ALT: 28 U/L (ref 0–44)
AST: 25 U/L (ref 15–41)
Albumin: 2.1 g/dL — ABNORMAL LOW (ref 3.5–5.0)
Alkaline Phosphatase: 118 U/L (ref 38–126)
Anion gap: 7 (ref 5–15)
BUN: 34 mg/dL — ABNORMAL HIGH (ref 8–23)
CO2: 28 mmol/L (ref 22–32)
Calcium: 7.8 mg/dL — ABNORMAL LOW (ref 8.9–10.3)
Chloride: 95 mmol/L — ABNORMAL LOW (ref 98–111)
Creatinine, Ser: 0.93 mg/dL (ref 0.61–1.24)
GFR, Estimated: >60 mL/min (ref >60)
Glucose, Bld: 99 mg/dL (ref 70–99)
Potassium: 4.1 mmol/L (ref 3.5–5.1)
Sodium: 130 mmol/L — ABNORMAL LOW (ref 135–145)
Total Bilirubin: 0.6 mg/dL (ref 0.3–1.2)
Total Protein: 4.7 g/dL — ABNORMAL LOW (ref 6.5–8.1)

## 2020-07-08 LAB — BLOOD GAS, ARTERIAL
Acid-Base Excess: 7.5 mmol/L — ABNORMAL HIGH (ref 0.0–2.0)
Bicarbonate: 30 mmol/L — ABNORMAL HIGH (ref 20.0–28.0)
FIO2: 1
O2 Saturation: 100 %
Patient temperature: 37
pCO2 arterial: 32 mmHg (ref 32.0–48.0)
pH, Arterial: 7.58 — ABNORMAL HIGH (ref 7.350–7.450)
pO2, Arterial: 336 mmHg — ABNORMAL HIGH (ref 83.0–108.0)

## 2020-07-08 LAB — TROPONIN I (HIGH SENSITIVITY)
Troponin I (High Sensitivity): 198 ng/L (ref <18)
Troponin I (High Sensitivity): 184 ng/L (ref <18)

## 2020-07-08 LAB — BRAIN NATRIURETIC PEPTIDE: B Natriuretic Peptide: 258.2 pg/mL — ABNORMAL HIGH (ref 0.0–100.0)

## 2020-07-08 MED ORDER — IOHEXOL 350 MG/ML SOLN
75.0000 mL | Freq: Once | INTRAVENOUS | Status: AC | PRN
  Administered 2020-07-08: 75 mL via INTRAVENOUS

## 2020-07-08 NOTE — Discharge Instructions (Signed)
You have been seen in the emergency department for possible shortness of breath.  Your work-up has shown overall normal results.  Please follow-up with your doctor in the next several days for recheck/reevaluation.  Return to the emergency department for any symptoms personally concerning to yourself or staff members.

## 2020-07-08 NOTE — ED Notes (Signed)
Report to [facility] at this time.

## 2020-07-08 NOTE — ED Provider Notes (Addendum)
[facility] Emergency Department Provider Note  Time seen: 10:07 AM  I have reviewed the triage vital signs and the nursing notes.   HISTORY  Chief Complaint Respiratory Distress   HPI Alex Christensen is a 79 y.o. male with a past medical history of chronic leukemia, hypertension, CKD, TIA, presents to the emergency department for respiratory distress.  According to EMS they were called out to the patient's nursing facility, [facility] for decreased responsiveness and agonal respirations.  They state upon their arrival patient was awake and talking, satting 70% on room air.  Patient typically on 2 L, placed on 10 L nonrebreather and brought to the emergency department.  Here the patient is awake and alert.  He is oriented to person and place but not time, it is unclear what his baseline is.  Denies any complaints.  No chest pain, denies shortness of breath abdominal pain nausea vomiting diarrhea.  Largely negative review of systems.   Past Medical History:  Diagnosis Date  . High blood pressure   . Leukemia, chronic myeloid [facility])     Patient Active Problem List   Diagnosis Date Noted  . GI bleeding 06/01/2020  . Protein-calorie malnutrition, severe 03/12/2020  . Pressure injury of skin 03/11/2020  . C. difficile colitis 03/09/2020  . Sepsis secondary to UTI ([place]) 03/09/2020  . Leucocytosis 03/09/2020  . Essential hypertension 12/23/2019  . Hyperlipemia 12/23/2019  . Anemia 12/23/2019  . CKD (chronic kidney disease), stage IIIa 12/23/2019  . 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 Hyponatremia 12/23/2019  . 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[place]) 04/15/2019    Past Surgical History:  Procedure Laterality Date  . TOOTH EXTRACTION      Prior to Admission medications   Medication Sig Start Date End Date Taking? Authorizing Provider  Amino Acids-Protein Hydrolys (FEEDING SUPPLEMENT,  PRO-STAT SUGAR FREE 64,) LIQD Take 30 mLs by mouth in the morning and at bedtime.    [provider]  aspirin 325 MG EC tablet Take 325 mg by mouth daily.    [provider]  atorvastatin (LIPITOR) 10 MG tablet Take 10 mg by mouth daily at 6 PM.    [provider]  clopidogrel (PLAVIX) 75 MG tablet Take 1 tablet (75 mg total) by mouth daily. 12/24/19   Donzetta Starch, NP  ferrous sulfate 300 (60 Fe) MG/5ML syrup Take 300 mg by mouth daily.    [provider]  imatinib (GLEEVEC) 100 MG tablet Take 300 mg by mouth daily. Dissolve in water or apple juice, administer immediately.    [provider]  Nutritional Supplements (FEEDING SUPPLEMENT, OSMOLITE 1.5 CAL,) LIQD Place 1,000 mLs into feeding tube continuous. feeding supplement (OSMOLITE 1.5 CAL) liquid 700 mL Dose: 700 mL Freq: Every 24 hours Route: PER TUBE Last Dose: Stopped (03/14/20 0058) Start: 03/11/20 1800 Admin Instructions: Provide Osmolite 1.5 Cal at 50 mL/hr x 14 hrs each evening from 1800-0800 03/15/20   Ralene Muskrat B, MD  nystatin cream (MYCOSTATIN) Apply 1 application topically 2 (two) times daily. Apply to scrotum and sacral area    [provider]  pantoprazole (PROTONIX) 40 MG tablet Take 1 tablet (40 mg total) by mouth daily. 06/01/20 08/30/20  Enzo Bi, MD  traZODone (DESYREL) 50 MG tablet Take 75 mg by mouth at bedtime.    [provider]  zinc oxide (BALMEX) 11.3 % CREA cream Apply 1 application topically as directed. Apply  to sacrum and groin after each brief change    [provider]    No Known Allergies  No family history on file.  Social History Social History   Tobacco Use  . Smoking status: Former Research scientist (life sciences)  . Smokeless tobacco: Never Used  Substance Use Topics  . Alcohol use: Yes  . Drug use: Not on file    Review of Systems Constitutional: Negative for fever. Cardiovascular: Negative for chest pain. Respiratory: Negative for shortness  of breath. Gastrointestinal: Negative for abdominal pain, vomiting  Musculoskeletal: Negative for musculoskeletal complaints Neurological: Negative for headache All other ROS negative, although likely limited due to altered mental status versus baseline confusion  ____________________________________________   PHYSICAL EXAM:  Constitutional: Awake alert.  Oriented to person place only.  No distress. Eyes: Normal exam ENT      Head: Normocephalic and atraumatic.      Mouth/Throat: Mucous membranes are moist. Cardiovascular: Normal rate, regular rhythm.  Respiratory: Normal respiratory effort without tachypnea nor retractions. Breath sounds are clear  Gastrointestinal: Soft and nontender. No distention.  G-tube present. Musculoskeletal: Nontender extremities. Neurologic:  Normal speech and language. No gross focal neurologic deficits  Skin:  Skin is warm, dry and intact.  Psychiatric: Mood and affect are normal.   ____________________________________________  EKG viewed and interpreted by myself shows sinus tachycardia at 97 bpm the narrow QRS, normal axis, normal intervals, nonspecific ST changes  RADIOLOGY  Chest x-ray shows questionable pneumothorax we will proceed with CT of the chest CTA of the chest is negative  ____________________________________________   INITIAL IMPRESSION / ASSESSMENT AND PLAN / ED COURSE  Pertinent labs & imaging results that were available during my care of the patient were reviewed by me and considered in my medical decision making (see chart for details).   Patient presents to the emergency department for reports of decreased responsiveness and respiratory distress/agonal respirations.  Upon arrival patient is awake alert calm cooperative.  We will check labs, chest x-ray, ABG.  Differential would include ACS, pneumonia, CHF, metabolic or electrolyte abnormality, infectious etiology, hypercarbia.  Patient's chest x-ray shows possible  pneumothorax.  Proceed with CTA to evaluate.  There is also possible any occult pneumonia or underlying pulmonary embolism.  We will remove the patient's pulse oximeter from his hand which he grips in a fist to his foot we are getting a pulse ox 100% on 2 L.  Patient's ABG shows no sign of hypoxia either.  Patient CTA is essentially negative no pneumothorax, no intrathoracic process.  Lab work has resulted showing an elevated troponin however largely unchanged from the value 1 month ago in October.  Patient denies any chest pain.  I reviewed the patient's last admission in October patient had elevated troponin similar at that time but did not change over time.  Patient is satting well, white blood cell count is mildly elevated although the patient does have a history of CML which could account for this.  Vitals have remained well.  Patient's daughter is now here states the patient appears well.  I discussed with the daughter possible discharge home, she believes this is reasonable as well, states he appears really well and often times she states he will appear to have difficulty breathing because he has chronic recurrent hiccups which the nursing home has mistaken in the past for difficulty breathing.  Urinalysis is pending.  If urinalysis is normal I would anticipate likely discharge back to his nursing facility, daughter agreeable.  Alex Christensen was evaluated in  Emergency Department on 07/08/2020 for the symptoms described in the history of present illness. He was evaluated in the context of the global COVID-19 pandemic, which necessitated consideration that the patient might be at risk for infection with the SARS-CoV-2 virus that causes COVID-19. Institutional protocols and algorithms that pertain to the evaluation of patients at risk for COVID-19 are in a state of rapid change based on information released by regulatory bodies including the CDC and federal and state organizations. These policies and  algorithms were followed during the patient's care in the ED.  ____________________________________________   FINAL CLINICAL IMPRESSION(S) / ED DIAGNOSES  Dyspnea   Harvest Dark, MD 07/08/20 1440    Harvest Dark, MD 07/08/20 1501

## 2020-07-08 NOTE — ED Notes (Signed)
Called [facility] for transfer to [place]

## 2020-07-08 NOTE — ED Notes (Signed)
Date and time results received: 07/08/20 11:02 AM   Test: Troponin Critical Value: 198  Name of Provider Notified: Dr. Kerman Passey

## 2020-07-08 NOTE — ED Triage Notes (Signed)
Pt to ER via EMS from [facility] called out for unresponsive.  Pt alert and oriented x 2, noted to be hypoxic on EMS arrival.  Pt arrives to ER on NRB 10L, EMS reports stats 96% with this, but that pt drops rapidly off O2.

## 2020-07-09 ENCOUNTER — Emergency Department
Admission: EM | Admit: 2020-07-09 | Discharge: 2020-07-09 | Disposition: A | Discharge status: Home / Self Care | Payer: Medicare Other | Attending: Emergency Medicine | Admitting: Emergency Medicine

## 2020-07-09 ENCOUNTER — Other Ambulatory Visit: Payer: Self-pay

## 2020-07-09 ENCOUNTER — Emergency Department: Payer: Medicare Other

## 2020-07-09 DIAGNOSIS — R5381 Other malaise: Secondary | ICD-10-CM | POA: Insufficient documentation

## 2020-07-09 DIAGNOSIS — Z7902 Long term (current) use of antithrombotics/antiplatelets: Secondary | ICD-10-CM | POA: Diagnosis not present

## 2020-07-09 DIAGNOSIS — E860 Dehydration: Secondary | ICD-10-CM | POA: Insufficient documentation

## 2020-07-09 DIAGNOSIS — L89302 Pressure ulcer of unspecified buttock, stage 2: Secondary | ICD-10-CM | POA: Diagnosis not present

## 2020-07-09 DIAGNOSIS — R000 Tachycardia, unspecified: Secondary | ICD-10-CM | POA: Insufficient documentation

## 2020-07-09 DIAGNOSIS — I700 Atherosclerosis of aorta: Secondary | ICD-10-CM | POA: Insufficient documentation

## 2020-07-09 DIAGNOSIS — Z7982 Long term (current) use of aspirin: Secondary | ICD-10-CM | POA: Diagnosis not present

## 2020-07-09 DIAGNOSIS — Z87891 Personal history of nicotine dependence: Secondary | ICD-10-CM | POA: Diagnosis not present

## 2020-07-09 DIAGNOSIS — C9210 Chronic myeloid leukemia, BCR/ABL-positive, not having achieved remission: Secondary | ICD-10-CM | POA: Diagnosis not present

## 2020-07-09 DIAGNOSIS — R942 Abnormal results of pulmonary function studies: Secondary | ICD-10-CM | POA: Diagnosis present

## 2020-07-09 DIAGNOSIS — N1831 Chronic kidney disease, stage 3a: Secondary | ICD-10-CM | POA: Diagnosis not present

## 2020-07-09 DIAGNOSIS — Z8673 Personal history of transient ischemic attack (TIA), and cerebral infarction without residual deficits: Secondary | ICD-10-CM | POA: Insufficient documentation

## 2020-07-09 DIAGNOSIS — I129 Hypertensive chronic kidney disease with stage 1 through stage 4 chronic kidney disease, or unspecified chronic kidney disease: Secondary | ICD-10-CM | POA: Insufficient documentation

## 2020-07-09 LAB — CBC WITH DIFFERENTIAL/PLATELET
Abs Immature Granulocytes: 0.16 10*3/uL — ABNORMAL HIGH (ref 0.00–0.07)
Basophils Absolute: 0 10*3/uL (ref 0.0–0.1)
Basophils Relative: 0 %
Eosinophils Absolute: 0 10*3/uL (ref 0.0–0.5)
Eosinophils Relative: 0 %
HCT: 30.2 % — ABNORMAL LOW (ref 39.0–52.0)
Hemoglobin: 9.8 g/dL — ABNORMAL LOW (ref 13.0–17.0)
Immature Granulocytes: 1 %
Lymphocytes Relative: 1 %
Lymphs Abs: 0.2 10*3/uL — ABNORMAL LOW (ref 0.7–4.0)
MCH: 30.8 pg (ref 26.0–34.0)
MCHC: 32.5 g/dL (ref 30.0–36.0)
MCV: 95 fL (ref 80.0–100.0)
Monocytes Absolute: 0.1 10*3/uL (ref 0.1–1.0)
Monocytes Relative: 0 %
Neutro Abs: 19.3 10*3/uL — ABNORMAL HIGH (ref 1.7–7.7)
Neutrophils Relative %: 98 %
Platelets: 346 10*3/uL (ref 150–400)
RBC: 3.18 MIL/uL — ABNORMAL LOW (ref 4.22–5.81)
RDW: 15.3 % (ref 11.5–15.5)
WBC: 19.8 10*3/uL — ABNORMAL HIGH (ref 4.0–10.5)
nRBC: 0 % (ref 0.0–0.2)

## 2020-07-09 LAB — BASIC METABOLIC PANEL
Anion gap: 11 (ref 5–15)
BUN: 35 mg/dL — ABNORMAL HIGH (ref 8–23)
CO2: 24 mmol/L (ref 22–32)
Calcium: 8 mg/dL — ABNORMAL LOW (ref 8.9–10.3)
Chloride: 97 mmol/L — ABNORMAL LOW (ref 98–111)
Creatinine, Ser: 0.85 mg/dL (ref 0.61–1.24)
GFR, Estimated: >60 mL/min (ref >60)
Glucose, Bld: 86 mg/dL (ref 70–99)
Potassium: 4.3 mmol/L (ref 3.5–5.1)
Sodium: 132 mmol/L — ABNORMAL LOW (ref 135–145)

## 2020-07-09 MED ORDER — SODIUM CHLORIDE 0.9 % IV BOLUS
500.0000 mL | Freq: Once | INTRAVENOUS | Status: AC
  Administered 2020-07-09: 500 mL via INTRAVENOUS

## 2020-07-09 MED ORDER — ACETAMINOPHEN 325 MG PO TABS
650.0000 mg | ORAL_TABLET | Freq: Once | ORAL | Status: AC
  Administered 2020-07-09: 650 mg via ORAL
  Filled 2020-07-09: qty 2

## 2020-07-09 MED ORDER — IOHEXOL 300 MG/ML  SOLN
75.0000 mL | Freq: Once | INTRAMUSCULAR | Status: AC | PRN
  Administered 2020-07-09: 75 mL via INTRAVENOUS

## 2020-07-09 MED ORDER — MORPHINE SULFATE (PF) 2 MG/ML IV SOLN
2.0000 mg | Freq: Once | INTRAVENOUS | Status: DC
  Filled 2020-07-09: qty 1

## 2020-07-09 NOTE — ED Notes (Signed)
Attempted to call report to nursing facility with no answer. Will try to call again to send pt back.

## 2020-07-09 NOTE — ED Notes (Signed)
Attempted to call report to [facility] again with no answer.

## 2020-07-09 NOTE — ED Notes (Signed)
Labs unable to be drawn by this nurse or other nurse. Lab called to come draw labs. Second nurse was able to place IV successfully but IV does not draw back.

## 2020-07-09 NOTE — ED Notes (Signed)
Pt entire buttocks have stage 2 PI. Pt arrived in chux with BM in chux. Pt cleaned and moisture barrier cream and sacral foam pad applied.

## 2020-07-09 NOTE — ED Notes (Signed)
X-ray at bedside

## 2020-07-09 NOTE — ED Notes (Signed)
Wife still at bedside.

## 2020-07-09 NOTE — ED Triage Notes (Signed)
Pt to ED via [facility]. Pt is resident at [facility], who called EMS because "VS were low". Pt was seen at [facility] yesterday for same reason. Pt has hx dementia, dysphagia, CML, HTN. EDP at bedside. Pt found to be alert and answering questions appropriately.

## 2020-07-09 NOTE — ED Notes (Signed)
Wife at bedside.

## 2020-07-09 NOTE — ED Provider Notes (Signed)
[facility] Emergency Department Provider Note  ____________________________________________  Time seen: Approximately 12:43 PM  I have reviewed the triage vital signs and the nursing notes.   HISTORY  Chief Complaint Possible hypoxia   Level 5 Caveat: Portions of the History and Physical including HPI and review of systems are unable to be completely obtained due to patient being a poor historian   HPI Alex Christensen is a 79 y.o. male with past medical history of CLL, CKD, hypertension who was sent to the ED from [facility] due to "low vital signs" today.  Initial report to EMS from the facility was at the patient had an oxygen saturation of 27% on room air.  However, EMS note that it was about 94% on room air and at the patient's fingers were cold.  Patient denies any acute complaints, no acute complaints relayed by facility.  No vomiting diarrhea syncope or trauma.   Patient denies chest pain or shortness of breath or cough     Past Medical History:  Diagnosis Date  . High blood pressure   . Leukemia, chronic myeloid [facility])      Patient Active Problem List   Diagnosis Date Noted  . GI bleeding 06/01/2020  . Protein-calorie malnutrition, severe 03/12/2020  . Pressure injury of skin 03/11/2020  . C. difficile colitis 03/09/2020  . Sepsis secondary to UTI ([place]) 03/09/2020  . Leucocytosis 03/09/2020  . Essential hypertension 12/23/2019  . Hyperlipemia 12/23/2019  . Anemia 12/23/2019  . CKD (chronic kidney disease), stage IIIa 12/23/2019  . 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 Hyponatremia 12/23/2019  . 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[place]) 04/15/2019     Past Surgical History:  Procedure Laterality Date  . TOOTH EXTRACTION       Prior to Admission medications   Medication Sig Start Date End Date Taking? Authorizing Provider  Amino Acids-Protein Hydrolys (FEEDING  SUPPLEMENT, PRO-STAT SUGAR FREE 64,) LIQD Take 30 mLs by mouth in the morning and at bedtime.    [provider]  aspirin 325 MG EC tablet Take 325 mg by mouth daily.    [provider]  atorvastatin (LIPITOR) 10 MG tablet Take 10 mg by mouth daily at 6 PM.    [provider]  clopidogrel (PLAVIX) 75 MG tablet Take 1 tablet (75 mg total) by mouth daily. 12/24/19   Donzetta Starch, NP  ferrous sulfate 300 (60 Fe) MG/5ML syrup Take 300 mg by mouth daily.    [provider]  imatinib (GLEEVEC) 100 MG tablet Take 300 mg by mouth daily. Dissolve in water or apple juice, administer immediately.    [provider]  Nutritional Supplements (FEEDING SUPPLEMENT, OSMOLITE 1.5 CAL,) LIQD Place 1,000 mLs into feeding tube continuous. feeding supplement (OSMOLITE 1.5 CAL) liquid 700 mL Dose: 700 mL Freq: Every 24 hours Route: PER TUBE Last Dose: Stopped (03/14/20 0058) Start: 03/11/20 1800 Admin Instructions: Provide Osmolite 1.5 Cal at 50 mL/hr x 14 hrs each evening from 1800-0800 03/15/20   Ralene Muskrat B, MD  nystatin cream (MYCOSTATIN) Apply 1 application topically 2 (two) times daily. Apply to scrotum and sacral area    [provider]  pantoprazole (PROTONIX) 40 MG tablet Take 1 tablet (40 mg total) by mouth daily. 06/01/20 08/30/20  Enzo Bi, MD  traZODone (DESYREL) 50 MG tablet Take 75 mg by mouth at bedtime.    [provider]  zinc oxide (BALMEX) 11.3 %  CREA cream Apply 1 application topically as directed. Apply to sacrum and groin after each brief change    [provider]     Allergies Patient has no known allergies.   History reviewed. No pertinent family history.  Social History Social History   Tobacco Use  . Smoking status: Former Research scientist (life sciences)  . Smokeless tobacco: Never Used  Substance Use Topics  . Alcohol use: Yes  . Drug use: Not on file    Review of Systems Level 5 Caveat: Portions of the History and Physical  including HPI and review of systems are unable to be completely obtained due to patient being a poor historian   Constitutional:   No known fever.  ENT:   No rhinorrhea. Cardiovascular:   No chest pain or syncope. Respiratory:   No dyspnea or cough. Gastrointestinal:   Negative for abdominal pain, vomiting and diarrhea.  Musculoskeletal:   Negative for focal pain or swelling ____________________________________________   PHYSICAL EXAM:  VITAL SIGNS: ED Triage Vitals  Enc Vitals Group     BP 07/09/20 0940 117/76     Pulse Rate 07/09/20 0940 (!) 115     Resp 07/09/20 0940 18     Temp 07/09/20 0940 98.8 F (37.1 C)     Temp Source 07/09/20 0940 Oral     SpO2 07/09/20 0940 99 %     Weight 07/09/20 0943 105 lb (47.6 kg)     Height 07/09/20 0943 5\' 5"  (1.651 m)     Head Circumference --      [facility] --      Pain Score --      Pain Loc --      Pain Edu? --      Excl. in [place]? --     Vital signs reviewed, nursing assessments reviewed.   Constitutional:   Alert and oriented. Non-toxic appearance. Eyes:   Conjunctivae are normal. EOMI. PERRL. ENT      Head:   Normocephalic and atraumatic.      Nose:   No congestion/rhinnorhea.       Mouth/Throat:   Dry mucous membranes, no pharyngeal erythema. No peritonsillar mass.       Neck:   No meningismus. Full ROM. Hematological/Lymphatic/Immunilogical:   No cervical lymphadenopathy. Cardiovascular:   Tachycardia heart rate 120. Symmetric bilateral radial and DP pulses.  No murmurs. Cap refill less than 2 seconds. Respiratory:   Normal respiratory effort without tachypnea/retractions. Breath sounds are clear and equal bilaterally. No wheezes/rales/rhonchi. Gastrointestinal:   Soft and nontender. Non distended. There is no CVA tenderness.  No rebound, rigidity, or guarding.  Musculoskeletal:   Normal range of motion in all extremities. No joint effusions.  No lower extremity tenderness.  No edema. Neurologic:   Normal speech and  language.  Motor grossly intact. No acute focal neurologic deficits are appreciated.  Skin:    Skin is warm, dry and intact. No rash noted.  No petechiae, purpura, or bullae.  ____________________________________________    LABS (pertinent positives/negatives) (all labs ordered are listed, but only abnormal results are displayed) Labs Reviewed  BASIC METABOLIC PANEL - Abnormal; Notable for the following components:      Result Value   Sodium 132 (*)    Chloride 97 (*)    BUN 35 (*)    Calcium 8.0 (*)    All other components within normal limits  CBC WITH DIFFERENTIAL/PLATELET - Abnormal; Notable for the following components:   WBC 19.8 (*)    RBC 3.18 (*)  Hemoglobin 9.8 (*)    HCT 30.2 (*)    Neutro Abs 19.3 (*)    Lymphs Abs 0.2 (*)    Abs Immature Granulocytes 0.16 (*)    All other components within normal limits   ____________________________________________   EKG  Interpreted by me Sinus tachycardia rate 126.  Normal axis and intervals.  Normal QRS ST segments and T waves.  ____________________________________________    RADIOLOGY  DG Abdomen 1 View  Result Date: 07/09/2020 CLINICAL DATA:  79 year old male with decreased oral intake. EXAM: ABDOMEN - 1 VIEW COMPARISON:  Chest radiograph from earlier the same day and chest CT from 07/08/2020 FINDINGS: Gaseous distension of the stomach. Otherwise nonobstructive bowel gas pattern. Gastrostomy tube in place in the left upper quadrant. Previously administered, excreted iodinated contrast within the partially distended bladder. Atherosclerotic calcifications are noted. IMPRESSION: Gaseous distension of the stomach with indwelling gastrostomy tube, otherwise nonobstructive bowel gas pattern. Recommend gastric tube venting. Electronically Signed   By: Ruthann Cancer MD   On: 07/09/2020 11:20   CT ABDOMEN PELVIS W CONTRAST  Result Date: 07/09/2020 CLINICAL DATA:  Suspected bowel obstruction. EXAM: CT ABDOMEN AND PELVIS  WITH CONTRAST TECHNIQUE: Multidetector CT imaging of the abdomen and pelvis was performed using the standard protocol following bolus administration of intravenous contrast. CONTRAST:  33mL OMNIPAQUE IOHEXOL 300 MG/ML  SOLN COMPARISON:  None. FINDINGS: Lower chest: Calcific atherosclerotic disease of the coronary arteries and aorta. Normal heart size. Hepatobiliary: 1.5 cm indeterminate hypoattenuated mass in the left lobe of the liver. Normal appearing decompressed gallbladder. Pancreas: Unremarkable. No pancreatic ductal dilatation or surrounding inflammatory changes. Spleen: Normal in size without focal abnormality. Adrenals/Urinary Tract: Normal adrenal glands. 3 cm left renal cyst. No evidence of hydronephrosis. Normal appearance of the urinary bladder. Stomach/Bowel: Gas distended stomach. Gastrostomy tube appears patent with balloon against the anterior wall of the gastric body. No evidence of small-bowel obstruction. Nonspecific mild circumferential thickening of the small bowel loops in the lower abdomen. Diffuse circumferential thickening of the distal transverse, descending colon, sigmoid colon and rectum. Vascular/Lymphatic: Tortuosity and calcific atherosclerotic disease of the aorta. No evidence of lymphadenopathy. Reproductive: Nonenlarged prostate gland containing nonspecific calcifications. Other: No abdominal wall hernia or abnormality. No abdominopelvic ascites. 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 Focally advanced osteoarthritic changes at L2-L3. 9 mm sclerotic lesion within S2, nonspecific. IMPRESSION: 1. Gas distended stomach. Gastrostomy tube appears patent with balloon against the anterior wall of the gastric body. 2. Nonspecific mild circumferential thickening of the small bowel loops in the lower abdomen. No evidence of small-bowel obstruction. 3. Diffuse circumferential thickening  of the distal transverse, descending colon, sigmoid colon and rectum. This may represent colitis of vascular, or infectious/inflammatory etiology. 4. 1.5 cm indeterminate hypoattenuated mass in the left lobe of the liver. 5. 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 6. 9 mm sclerotic lesion within S2, nonspecific. In the absence of malignancy capable of producing sclerotic lesions, this may represent a bone island. 7. Calcific atherosclerotic disease of the coronary arteries and aorta. Aortic Atherosclerosis (ICD10-I70.0). Electronically Signed   By: Fidela Salisbury M.D.   On: 07/09/2020 12:36   DG Chest Portable 1 View  Result Date: 07/09/2020 CLINICAL DATA:  Weakness, borderline oxygen level. EXAM: PORTABLE CHEST 1 VIEW COMPARISON:  Chest radiograph November 26 21. FINDINGS: The heart size and mediastinal contours are within normal  limits. Both lungs are clear. No visible pleural effusions or pneumothorax. No acute osseous abnormality. Marked gaseous distension of the stomach, partially imaged. IMPRESSION: 1. No acute cardiopulmonary disease. 2. Marked gaseous distension of the stomach, partially imaged. Electronically Signed   By: Margaretha Sheffield MD   On: 07/09/2020 10:15    ____________________________________________   PROCEDURES Procedures  ____________________________________________  DIFFERENTIAL DIAGNOSIS   Dehydration, viral syndrome, erroneous vital sign rating at [facility], pneumonia  CLINICAL IMPRESSION / ASSESSMENT AND PLAN / ED COURSE  Medications ordered in the ED: Medications  acetaminophen (TYLENOL) tablet 650 mg (has no administration in time range)  sodium chloride 0.9 % bolus 500 mL (0 mLs Intravenous Stopped 07/09/20 1141)  iohexol (OMNIPAQUE) 300 MG/ML solution 75 mL (75 mLs Intravenous Contrast Given 07/09/20 1152)  sodium chloride 0.9 % bolus 500 mL (500 mLs  Intravenous New Bag/Given 07/09/20 1439)    Pertinent labs & imaging results that were available during my care of the patient were reviewed by me and considered in my medical decision making (see chart for details).   Alex Christensen was evaluated in Emergency Department on 07/09/2020 for the symptoms described in the history of present illness. He was evaluated in the context of the global COVID-19 pandemic, which necessitated consideration that the patient might be at risk for infection with the SARS-CoV-2 virus that causes COVID-19. Institutional protocols and algorithms that pertain to the evaluation of patients at risk for COVID-19 are in a state of rapid change based on information released by regulatory bodies including the CDC and federal and state organizations. These policies and algorithms were followed during the patient's care in the ED.   Patient sent to ED with concern for hypoxia.  Oxygen saturation is normal for EMS and on arrival to the ED.  Patient has no acute complaints.  However, he is tachycardic, will check labs and chest x-ray.  Patient was seen in the ED yesterday and had unremarkable work-up except for leukocytosis of 19,000, Covid test negative.  ----------------------------------------- 12:46 PM on 07/09/2020 -----------------------------------------  Chest x-ray suggest distention of stomach, so KUB was obtained.  I viewed the abdominal x-ray image myself which shows unilateral intestinal gas accumulation and therefore ordered a CT abdomen pelvis to further evaluate which is overall reassuring.  Vital signs remained stable, tachycardia improved by giving IV fluids.  Labs stable with baseline anemia and continued leukocytosis of 19,000 as seen yesterday.  Chemistry panel unremarkable.  Clinical Course as of Jul 09 1500  Sat Jul 09, 2020  1330 White count of 19,000 stable since yesterday, I think related to underlying CML.  WBC(!): 19.8 [PS]    Clinical Course User  Index [PS] Carrie Mew, MD     ----------------------------------------- 3:02 PM on 07/09/2020 -----------------------------------------  Heart rate improved with continued hydration, now down to 105.  Oxygenation remains normal, unlabored breathing.  Other vital signs normal.  Plan for continued outpatient hydration at his skilled facility, follow-up with his doctor this week.  ____________________________________________   FINAL CLINICAL IMPRESSION(S) / ED DIAGNOSES    Final diagnoses:  Physical deconditioning  Pressure injury of buttock, stage 2, unspecified laterality ([place])  CML (chronic myelocytic leukemia) ([place])  Aortic atherosclerosis ([place])  Sinus tachycardia  Dehydration     ED Discharge Orders    None      Portions of this note were generated with dragon dictation software. Dictation errors may occur despite best attempts at proofreading.   Carrie Mew, MD 07/09/20 [phone]

## 2020-07-09 NOTE — ED Notes (Signed)
Took over care of pt. Pt resting comfortably with wife at bedside. Pt able to drink water without complication. Pt in NAD at this time. VSS. Awaiting further orders. Will continue to monitor.

## 2020-07-09 NOTE — ED Notes (Signed)
Called [facility] for transport to [place]

## 2020-07-13 ENCOUNTER — Non-Acute Institutional Stay: Payer: Medicare Other | Admitting: Primary Care

## 2020-07-13 ENCOUNTER — Other Ambulatory Visit: Payer: Self-pay

## 2020-07-13 DIAGNOSIS — Z515 Encounter for palliative care: Secondary | ICD-10-CM

## 2020-07-13 DIAGNOSIS — E43 Unspecified severe protein-calorie malnutrition: Secondary | ICD-10-CM

## 2020-07-13 NOTE — Progress Notes (Signed)
[facility] Consult Note Telephone: [phone]  Fax: [phone]     Date of encounter: 07/13/20 PATIENT NAME: Mendel Binsfeld [address] [place] [address] [phone] (home)  DOB: 01-15-1941 MRN: [id number]  PRIMARY CARE PROVIDER:    Rica Koyanagi, MD [address] [place],  [place] [address] [phone]  REFERRING PROVIDER:   Rica Koyanagi, MD [address] [place],  [place] [address] [phone]  RESPONSIBLE PARTY:   Extended Emergency Contact Information Primary Emergency Contact: frederky [place] Mobile Phone: [phone] Relation: Spouse Secondary Emergency Contact: Kassim, Gu[place]in Mobile Phone: [phone] Relation: Daughter  I met face to face with patient and family in facility. Palliative Care was asked to follow this patient by consultation request of Rica Koyanagi, MD to help address advance care planning and goals of care. This is a follow up  visit.   ASSESSMENT AND RECOMMENDATIONS:   1. Advance Care Planning/Goals of Care: Goals include to maximize quality of life and symptom management. Our advance care planning conversation included a discussion about:     The value and importance of advance care planning   Exploration of personal, cultural or spiritual beliefs that might influence medical decisions   Exploration of goals of care in the event of a sudden injury or illness - several recent hospital trips for sx that resolved before ED assessment  Identification of a healthcare agent Wife   Review an  advance directive document .  Pt wife discussed her decision to continue full scope of services. Recently Mr Merwin had several episodes of SOB, and was taken to ED. On both occasions the sx resolved without Rx. We discussed decision to pursue appropriate treatment in place to decrease the fatigue of ED trips. She states she feels he needs to be assessed in the ED. She states she wants to  continue Full measures and scope of care. I discussed my concerns around his pronounced frailty, but she states she could not live with herself not doing "everything". We discussed that in the future if he is less interactive or shows rapid decline she would reconsider this decision in that context.  2. Symptom Management:   Patient appears calm and comfortable. He is quite pale due to ongoing anemia. He denies discomforts and smiles and interacts. He is bedbound and unable to position himself in bed. Continues tube feeds at hs and does not take much po, a puree diet.  3. Follow up Palliative Care Visit: Palliative care will continue to follow for goals of care clarification and symptom management. Return 4 weeks or prn.  4. Family /Caregiver/Community Supports: Wife is POA, lives in [place]  5. Cognitive / Functional decline: A and O x 1, dependent in all adls and iadls.   I spent 25 minutes providing this consultation,  from 1200 to 1225. More than 50% of the time in this consultation was spent coordinating communication.   CODE STATUS: FULL CODE  PPS: 30%  HOSPICE ELIGIBILITY/DIAGNOSIS: TBD  Subjective:  CHIEF COMPLAINT: debility  HISTORY OF PRESENT ILLNESS:  Jhonathan Desroches is a 79 y.o. year old male  with dementia, debility, anorexia, tube feedings.   We are asked to consult around advance care planning and complex medical decision making.   History obtained from review of EMR, discussion with primary team, and  interview with family, caregiver  and/or Mr. Carden. Records reviewed and summarized above.   CURRENT PROBLEM LIST:  Patient Active Problem List   Diagnosis Date Noted  GI bleeding 06/01/2020   Protein-calorie malnutrition, severe 03/12/2020   Pressure injury of skin 03/11/2020   C. 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 difficile colitis 03/09/2020   Sepsis secondary to UTI ([place]) 03/09/2020   Leucocytosis 03/09/2020   Pressure injury of skin 03/11/2020   Protein-calorie malnutrition, severe 03/12/2020   GI bleeding 06/01/2020   Resolved Ambulatory Problems    Diagnosis Date Noted   No Resolved Ambulatory Problems   Past Medical History:  Diagnosis Date   High blood pressure    Leukemia, chronic myeloid ([place])    SOCIAL HX:  Social History   Tobacco Use   Smoking status: Former Smoker   Smokeless tobacco: Never Used  Substance Use Topics   Alcohol use: Yes   FAMILY HX: No family history on file.     ALLERGIES: No Known Allergies   PERTINENT MEDICATIONS:  Outpatient Encounter Medications as of 07/13/2020  Medication Sig   Amino Acids-Protein Hydrolys (FEEDING SUPPLEMENT, PRO-STAT SUGAR FREE 64,) LIQD Take 30 mLs by mouth in the morning and at bedtime.   aspirin 325 MG EC tablet Take 325 mg by mouth daily.   atorvastatin (LIPITOR) 10 MG tablet Take 10 mg by mouth daily at 6 PM.   clopidogrel (PLAVIX) 75 MG tablet Take 1 tablet (75 mg total) by mouth daily.   ferrous sulfate 300 (60 Fe) MG/5ML syrup Take 300 mg by mouth daily.   imatinib (GLEEVEC) 100 MG tablet Take 300 mg by mouth daily. Dissolve in water or apple juice,  administer immediately.   Nutritional Supplements (FEEDING SUPPLEMENT, OSMOLITE 1.5 CAL,) LIQD Place 1,000 mLs into feeding tube continuous. feeding supplement (OSMOLITE 1.5 CAL) liquid 700 mL Dose: 700 mL Freq: Every 24 hours Route: PER TUBE Last Dose: Stopped (03/14/20 0058) Start: 03/11/20 1800 Admin Instructions: Provide Osmolite 1.5 Cal at 50 mL/hr x 14 hrs each evening from 1800-0800   nystatin cream (MYCOSTATIN) Apply 1 application topically 2 (two) times daily. Apply to scrotum and sacral area   pantoprazole (PROTONIX) 40 MG tablet Take 1 tablet (40 mg total) by mouth daily.   traZODone (DESYREL) 50 MG tablet Take 75 mg by mouth at bedtime.   zinc oxide (BALMEX) 11.3 % CREA cream Apply 1 application topically as directed. Apply to sacrum and groin after each brief change   No facility-administered encounter medications on file as of 07/13/2020.    Objective: deferred   Thank you for the opportunity to participate in the care of Mr. Cafaro.  The palliative care team will continue to follow. Please call our office at [phone] if we can be of additional assistance.  Jason Coop, NP , DNP, MPH, AGPCNP-BC, [facility]  COVID-19 PATIENT SCREENING TOOL  Person answering questions:  ____________staff______ _____   1.  Is the patient or any family member in the home showing any signs or symptoms regarding respiratory infection?               Person with Symptom- __________NA_________________  a. Fever                                                                          Yes___ No___          ___________________  b. Shortness of breath                                                    Yes___ No___          ___________________ c. Cough/congestion                                       Yes___  No___         ___________________ d. Body aches/pains                                                         Yes___ No___        ____________________ e. Gastrointestinal symptoms  (diarrhea, nausea)           Yes___ No___        ____________________  2. Within the past 14 days, has anyone living in the home had any contact with someone with or under investigation for COVID-19?    Yes___ No_X_   Person __________________

## 2020-07-28 ENCOUNTER — Inpatient Hospital Stay
Admission: EM | Admit: 2020-07-28 | Discharge: 2020-08-01 | DRG: 840 | Disposition: A | Discharge status: Skilled Nursing Facility | Payer: Medicare Other | Attending: Internal Medicine | Admitting: Internal Medicine

## 2020-07-28 ENCOUNTER — Emergency Department: Payer: Medicare Other

## 2020-07-28 ENCOUNTER — Other Ambulatory Visit: Payer: Self-pay

## 2020-07-28 DIAGNOSIS — I129 Hypertensive chronic kidney disease with stage 1 through stage 4 chronic kidney disease, or unspecified chronic kidney disease: Secondary | ICD-10-CM | POA: Diagnosis present

## 2020-07-28 DIAGNOSIS — F0390 Unspecified dementia without behavioral disturbance: Secondary | ICD-10-CM | POA: Diagnosis present

## 2020-07-28 DIAGNOSIS — E785 Hyperlipidemia, unspecified: Secondary | ICD-10-CM | POA: Diagnosis present

## 2020-07-28 DIAGNOSIS — J189 Pneumonia, unspecified organism: Secondary | ICD-10-CM | POA: Diagnosis present

## 2020-07-28 DIAGNOSIS — Z7982 Long term (current) use of aspirin: Secondary | ICD-10-CM

## 2020-07-28 DIAGNOSIS — Z87891 Personal history of nicotine dependence: Secondary | ICD-10-CM

## 2020-07-28 DIAGNOSIS — C9210 Chronic myeloid leukemia, BCR/ABL-positive, not having achieved remission: Principal | ICD-10-CM | POA: Diagnosis present

## 2020-07-28 DIAGNOSIS — D75839 Thrombocytosis, unspecified: Secondary | ICD-10-CM | POA: Diagnosis present

## 2020-07-28 DIAGNOSIS — E43 Unspecified severe protein-calorie malnutrition: Secondary | ICD-10-CM | POA: Diagnosis present

## 2020-07-28 DIAGNOSIS — E871 Hypo-osmolality and hyponatremia: Secondary | ICD-10-CM | POA: Diagnosis present

## 2020-07-28 DIAGNOSIS — Z931 Gastrostomy status: Secondary | ICD-10-CM

## 2020-07-28 DIAGNOSIS — R1310 Dysphagia, unspecified: Secondary | ICD-10-CM | POA: Diagnosis present

## 2020-07-28 DIAGNOSIS — Z20822 Contact with and (suspected) exposure to covid-19: Secondary | ICD-10-CM | POA: Diagnosis present

## 2020-07-28 DIAGNOSIS — Z8673 Personal history of transient ischemic attack (TIA), and cerebral infarction without residual deficits: Secondary | ICD-10-CM

## 2020-07-28 DIAGNOSIS — I10 Essential (primary) hypertension: Secondary | ICD-10-CM | POA: Diagnosis present

## 2020-07-28 DIAGNOSIS — N1831 Chronic kidney disease, stage 3a: Secondary | ICD-10-CM | POA: Diagnosis present

## 2020-07-28 DIAGNOSIS — D649 Anemia, unspecified: Secondary | ICD-10-CM | POA: Diagnosis not present

## 2020-07-28 DIAGNOSIS — D631 Anemia in chronic kidney disease: Secondary | ICD-10-CM | POA: Diagnosis present

## 2020-07-28 DIAGNOSIS — Z681 Body mass index (BMI) 19 or less, adult: Secondary | ICD-10-CM

## 2020-07-28 DIAGNOSIS — N183 Chronic kidney disease, stage 3 unspecified: Secondary | ICD-10-CM | POA: Diagnosis present

## 2020-07-28 DIAGNOSIS — Z79899 Other long term (current) drug therapy: Secondary | ICD-10-CM

## 2020-07-28 DIAGNOSIS — Z7902 Long term (current) use of antithrombotics/antiplatelets: Secondary | ICD-10-CM

## 2020-07-28 HISTORY — DX: Hyperosmolality and hypernatremia: E87.0

## 2020-07-28 HISTORY — DX: Iron deficiency: E61.1

## 2020-07-28 HISTORY — DX: Transient cerebral ischemic attack, unspecified: G45.9

## 2020-07-28 LAB — RESP PANEL BY RT-PCR (FLU A&B, COVID) ARPGX2
Influenza A by PCR: NEGATIVE
Influenza B by PCR: NEGATIVE
SARS Coronavirus 2 by RT PCR: NEGATIVE

## 2020-07-28 LAB — COMPREHENSIVE METABOLIC PANEL
ALT: 42 U/L (ref 0–44)
AST: 26 U/L (ref 15–41)
Albumin: 2.2 g/dL — ABNORMAL LOW (ref 3.5–5.0)
Alkaline Phosphatase: 115 U/L (ref 38–126)
Anion gap: 8 (ref 5–15)
BUN: 34 mg/dL — ABNORMAL HIGH (ref 8–23)
CO2: 27 mmol/L (ref 22–32)
Calcium: 8.4 mg/dL — ABNORMAL LOW (ref 8.9–10.3)
Chloride: 97 mmol/L — ABNORMAL LOW (ref 98–111)
Creatinine, Ser: 0.67 mg/dL (ref 0.61–1.24)
GFR, Estimated: >60 mL/min (ref >60)
Glucose, Bld: 94 mg/dL (ref 70–99)
Potassium: 4.6 mmol/L (ref 3.5–5.1)
Sodium: 132 mmol/L — ABNORMAL LOW (ref 135–145)
Total Bilirubin: 0.4 mg/dL (ref 0.3–1.2)
Total Protein: 5.4 g/dL — ABNORMAL LOW (ref 6.5–8.1)

## 2020-07-28 LAB — RETICULOCYTES
Immature Retic Fract: 33.2 % — ABNORMAL HIGH (ref 2.3–15.9)
RBC.: 2.3 MIL/uL — ABNORMAL LOW (ref 4.22–5.81)
Retic Count, Absolute: 84.2 10*3/uL (ref 19.0–186.0)
Retic Ct Pct: 3.7 % — ABNORMAL HIGH (ref 0.4–3.1)

## 2020-07-28 LAB — CBC
HCT: 21.5 % — ABNORMAL LOW (ref 39.0–52.0)
Hemoglobin: 7.2 g/dL — ABNORMAL LOW (ref 13.0–17.0)
MCH: 31.3 pg (ref 26.0–34.0)
MCHC: 33.5 g/dL (ref 30.0–36.0)
MCV: 93.5 fL (ref 80.0–100.0)
Platelets: 554 10*3/uL — ABNORMAL HIGH (ref 150–400)
RBC: 2.3 MIL/uL — ABNORMAL LOW (ref 4.22–5.81)
RDW: 16 % — ABNORMAL HIGH (ref 11.5–15.5)
WBC: 12.3 10*3/uL — ABNORMAL HIGH (ref 4.0–10.5)
nRBC: 0 % (ref 0.0–0.2)

## 2020-07-28 LAB — PROTIME-INR
INR: 0.9 (ref 0.8–1.2)
Prothrombin Time: 12 s (ref 11.4–15.2)

## 2020-07-28 MED ORDER — ONDANSETRON HCL 4 MG PO TABS: 4.0000 mg | ORAL_TABLET | Freq: Four times a day (QID) | ORAL | Status: DC | PRN

## 2020-07-28 MED ORDER — DONEPEZIL HCL 5 MG PO TABS
5.0000 mg | ORAL_TABLET | Freq: Every day | ORAL | Status: DC
  Administered 2020-07-30 – 2020-08-01 (×3): 5 mg via ORAL
  Filled 2020-07-28 (×3): qty 1

## 2020-07-28 MED ORDER — CALCIUM CARBONATE-VITAMIN D 500-200 MG-UNIT PO TABS
1.0000 | ORAL_TABLET | Freq: Two times a day (BID) | ORAL | Status: DC
  Administered 2020-07-29 – 2020-08-01 (×6): 1 via ORAL
  Filled 2020-07-28 (×8): qty 1

## 2020-07-28 MED ORDER — PRO-STAT SUGAR FREE PO LIQD
30.0000 mL | Freq: Three times a day (TID) | ORAL | Status: DC
  Administered 2020-07-29: 11:00:00 30 mL via ORAL

## 2020-07-28 MED ORDER — CLOPIDOGREL BISULFATE 75 MG PO TABS
75.0000 mg | ORAL_TABLET | Freq: Every day | ORAL | Status: DC
  Administered 2020-07-30 – 2020-08-01 (×3): 75 mg via ORAL
  Filled 2020-07-28 (×3): qty 1

## 2020-07-28 MED ORDER — ACETAMINOPHEN 650 MG RE SUPP: 650.0000 mg | Freq: Four times a day (QID) | RECTAL | Status: DC | PRN

## 2020-07-28 MED ORDER — IOHEXOL 350 MG/ML SOLN
100.0000 mL | Freq: Once | INTRAVENOUS | Status: AC | PRN
  Administered 2020-07-28: 100 mL via INTRAVENOUS

## 2020-07-28 MED ORDER — ACETAMINOPHEN 325 MG PO TABS: 650.0000 mg | ORAL_TABLET | Freq: Four times a day (QID) | ORAL | Status: DC | PRN

## 2020-07-28 MED ORDER — PANTOPRAZOLE SODIUM 40 MG IV SOLR
40.0000 mg | Freq: Two times a day (BID) | INTRAVENOUS | Status: DC
  Administered 2020-07-28 – 2020-08-01 (×8): 40 mg via INTRAVENOUS
  Filled 2020-07-28 (×8): qty 40

## 2020-07-28 MED ORDER — OSMOLITE 1.5 CAL PO LIQD
1000.0000 mL | ORAL | Status: DC
  Administered 2020-07-29: 01:00:00 1000 mL

## 2020-07-28 MED ORDER — FERROUS SULFATE 220 (44 FE) MG/5ML PO ELIX
300.0000 mg | ORAL_SOLUTION | Freq: Every day | ORAL | Status: DC
Start: 2020-07-29 — End: 2020-08-01
  Administered 2020-07-30 – 2020-08-01 (×3): 300 mg via ORAL
  Filled 2020-07-28 (×3): qty 6.82
  Filled 2020-07-28: qty 6.9

## 2020-07-28 MED ORDER — SODIUM CHLORIDE 0.9% IV SOLUTION: Freq: Once | INTRAVENOUS | Status: DC

## 2020-07-28 MED ORDER — ONDANSETRON HCL 4 MG/2ML IJ SOLN: 4.0000 mg | Freq: Four times a day (QID) | INTRAMUSCULAR | Status: DC | PRN

## 2020-07-28 MED ORDER — GUAIFENESIN 100 MG/5ML PO SOLN
400.0000 mg | Freq: Four times a day (QID) | ORAL | Status: DC | PRN
  Filled 2020-07-28: qty 20

## 2020-07-28 MED ORDER — ONDANSETRON 4 MG PO TBDP
4.0000 mg | ORAL_TABLET | Freq: Three times a day (TID) | ORAL | Status: DC | PRN
  Filled 2020-07-28: qty 1

## 2020-07-28 MED ORDER — ASPIRIN EC 325 MG PO TBEC
325.0000 mg | DELAYED_RELEASE_TABLET | Freq: Every day | ORAL | Status: DC
  Administered 2020-07-30 – 2020-08-01 (×3): 325 mg via ORAL
  Filled 2020-07-28 (×3): qty 1

## 2020-07-28 MED ORDER — PANTOPRAZOLE SODIUM 40 MG IV SOLR
40.0000 mg | Freq: Once | INTRAVENOUS | Status: AC
  Administered 2020-07-28: 18:00:00 40 mg via INTRAVENOUS
  Filled 2020-07-28: qty 40

## 2020-07-28 MED ORDER — TRAZODONE HCL 50 MG PO TABS
75.0000 mg | ORAL_TABLET | Freq: Every day | ORAL | Status: DC
  Administered 2020-07-29 – 2020-07-31 (×3): 75 mg via ORAL
  Filled 2020-07-28 (×3): qty 2

## 2020-07-28 MED ORDER — IMATINIB MESYLATE 100 MG PO TABS: 300.0000 mg | ORAL_TABLET | Freq: Every day | ORAL | Status: DC

## 2020-07-28 NOTE — ED Notes (Signed)
Patient sleeping. Breathing easy and unlabored free from sign of distress and with symmetric chest rise and fall. Bed low and locked with side rails raised x2 and call bell in reach. Cardiac monitor in place. Patient wakes easily to verbal stimuli. Denies pain or needs at this time.

## 2020-07-28 NOTE — ED Triage Notes (Signed)
Pt comes into the ED via EMS from [facility] with a hx of dementia, here for low hgb 6.3, 97HR, 97%RA, 124/67, has feeding stopped at 10am.the patient is alert on arrival

## 2020-07-28 NOTE — H&P (Signed)
History and Physical   Alex Christensen IPJ:[id number] DOB: February 20, 1941 DOA: 07/28/2020  Referring MD/NP/PA: Dr. Hulan Saas  PCP: [place] Pink, MD   Patient coming from: Home  Chief Complaint: Weakness and anemia  HPI: Alex Christensen is a 79 y.o. male with medical history significant of CML, essential hypertension, recurrent anemia, chronic kidney disease stage III, TIA, dementia, protein calorie malnutrition who was seen in the hospital on October 28 with significant anemia work-up was done including evaluation for source of his anemia.  The source of his anemia was not established.  Patient still follows up with oncology as well as GI.  He was seen in the office today and blood work was done showing hemoglobin of 6.  His previous hemoglobin was around 9 just back in late October.  He was sent over to the ER for evaluation.  In the ER here hemoglobin was found to be 7.2 but he appears clinically dry.  At this point suspicion is patient may have had symptomatic anemia from hemoglobin less than 7 g event.  He has been weak and debilitated per wife.  No dark stools.  No bright red blood per rectum no hematemesis.  Patient will be admitted for treatment of symptomatic anemia..  ED Course: Temperature 97.6 blood pressure 111/64 pulse 92 respiratory rate 25 oxygen sat 95% room air.  White count 12.3 hemoglobin 7.2 platelets 554 sodium is 132 potassium 4.6 chloride 97 CO2 27 BUN 34 creatinine 0.6, calcium 8.4.  Respiratory panel is negative his reticulocyte count is 3.7 with absolute count of 84.2.  CT angiogram abdomen pelvis with contrast showed no acute findings in the abdomen bite has bibasilar nodular consolidation and bronchiectasis.  Patient being admitted to the hospital for further evaluation and treatment  Review of Systems: As per HPI otherwise 10 point review of systems negative.    Past Medical History:  Diagnosis Date  . High blood pressure   . Hypernatremia   . Iron deficiency   .  Leukemia, chronic myeloid ([place])   . TIA (transient ischemic attack)     Past Surgical History:  Procedure Laterality Date  . TOOTH EXTRACTION       reports that he has quit smoking. He has never used smokeless tobacco. He reports previous alcohol use. No history on file for drug use.  No Known Allergies  No family history on file.   Prior to Admission medications   Medication Sig Start Date End Date Taking? Authorizing Provider  Amino Acids-Protein Hydrolys (FEEDING SUPPLEMENT, PRO-STAT SUGAR FREE 64,) LIQD Take 30 mLs by mouth in the morning and at bedtime.   Yes [provider]  aspirin 325 MG EC tablet Take 325 mg by mouth daily.   Yes [provider]  calcium-vitamin D (OSCAL WITH D) 500-200 MG-UNIT tablet Take 1 tablet by mouth 2 (two) times daily.   Yes [provider]  clopidogrel (PLAVIX) 75 MG tablet Take 1 tablet (75 mg total) by mouth daily. 12/24/19  Yes Donzetta Starch, NP  donepezil (ARICEPT) 5 MG tablet Take 5 mg by mouth daily.   Yes [provider]  ferrous sulfate 300 (60 Fe) MG/5ML syrup Take 300 mg by mouth daily.   Yes [provider]  guaifenesin (ROBITUSSIN) 100 MG/5ML syrup Take 400 mg by mouth in the morning, at noon, in the evening, and at bedtime. 07/18/20 08/01/20 Yes [provider]  imatinib (GLEEVEC) 100 MG tablet Take 300 mg by mouth daily. Dissolve in water or apple  juice, administer immediately.   Yes [provider]  ondansetron (ZOFRAN-ODT) 4 MG disintegrating tablet Take 4 mg by mouth every 8 (eight) hours as needed for nausea or vomiting.   Yes [provider]  traZODone (DESYREL) 50 MG tablet Take 75 mg by mouth at bedtime.   Yes [provider]  Nutritional Supplements (FEEDING SUPPLEMENT, OSMOLITE 1.5 CAL,) LIQD Place 1,000 mLs into feeding tube continuous. feeding supplement (OSMOLITE 1.5 CAL) liquid 700 mL Dose: 700 mL Freq: Every 24 hours Route: PER TUBE Last Dose:  Stopped (03/14/20 0058) Start: 03/11/20 1800 Admin Instructions: Provide Osmolite 1.5 Cal at 50 mL/hr x 14 hrs each evening from 1800-0800 03/15/20   Sidney Ace, MD    Physical Exam: Vitals:   07/28/20 1830 07/28/20 1930 07/28/20 2100 07/28/20 2115  BP: 113/66 111/64 111/83   Pulse: 76 74 83 66  Resp: 14 15 18 14   Temp:      TempSrc:      SpO2: 99% 100% 98% 100%  Weight:      Height:          Constitutional: Pale, chronically ill looking Vitals:   07/28/20 1830 07/28/20 1930 07/28/20 2100 07/28/20 2115  BP: 113/66 111/64 111/83   Pulse: 76 74 83 66  Resp: 14 15 18 14   Temp:      TempSrc:      SpO2: 99% 100% 98% 100%  Weight:      Height:       Eyes: PERRL, lids and conjunctivae pale ENMT: Mucous membranes are moist. Posterior pharynx clear of any exudate or lesions.Normal dentition.  Neck: normal, supple, no masses, no thyromegaly Respiratory: Coarse breath sounds bilaterally, no wheezing, no crackles. Normal respiratory effort. No accessory muscle use.  Cardiovascular: Regular rate and rhythm, no murmurs / rubs / gallops.  1+ extremity edema. 2+ pedal pulses. No carotid bruits.  Abdomen: no tenderness, no masses palpated. No hepatosplenomegaly. Bowel sounds positive.  Musculoskeletal: no clubbing / cyanosis. No joint deformity upper and lower extremities. Good ROM, no contractures. Normal muscle tone.  Skin: no rashes, lesions, ulcers. No induration Neurologic: CN 2-12 grossly intact. Sensation intact, DTR normal. Strength 5/5 in all 4.  Psychiatric: Confused but arousable    Labs on Admission: I have personally reviewed following labs and imaging studies  CBC: Recent Labs  Lab 07/28/20 1510  WBC 12.3*  HGB 7.2*  HCT 21.5*  MCV 93.5  PLT 161*   Basic Metabolic Panel: Recent Labs  Lab 07/28/20 1510  NA 132*  K 4.6  CL 97*  CO2 27  GLUCOSE 94  BUN 34*  CREATININE 0.67  CALCIUM 8.4*   GFR: Estimated Creatinine Clearance: 52 mL/min (by C-G  formula based on SCr of 0.67 mg/dL). Liver Function Tests: Recent Labs  Lab 07/28/20 1510  AST 26  ALT 42  ALKPHOS 115  BILITOT 0.4  PROT 5.4*  ALBUMIN 2.2*   No results for input(s): LIPASE, AMYLASE in the last 168 hours. No results for input(s): AMMONIA in the last 168 hours. Coagulation Profile: Recent Labs  Lab 07/28/20 1510  INR 0.9   Cardiac Enzymes: No results for input(s): CKTOTAL, CKMB, CKMBINDEX, TROPONINI in the last 168 hours. BNP (last 3 results) No results for input(s): PROBNP in the last 8760 hours. HbA1C: No results for input(s): HGBA1C in the last 72 hours. CBG: No results for input(s): GLUCAP in the last 168 hours. Lipid Profile: No results for input(s): CHOL, HDL, LDLCALC, TRIG, CHOLHDL, LDLDIRECT in  the last 72 hours. Thyroid Function Tests: No results for input(s): TSH, T4TOTAL, FREET4, T3FREE, THYROIDAB in the last 72 hours. Anemia Panel: Recent Labs    07/28/20 2014  RETICCTPCT 3.7*   Urine analysis:    Component Value Date/Time   COLORURINE YELLOW (A) 07/08/2020 1418   APPEARANCEUR CLEAR (A) 07/08/2020 1418   LABSPEC 1.041 (H) 07/08/2020 1418   PHURINE 9.0 (H) 07/08/2020 1418   GLUCOSEU NEGATIVE 07/08/2020 1418   HGBUR NEGATIVE 07/08/2020 [place] 07/08/2020 1418   KETONESUR NEGATIVE 07/08/2020 1418   PROTEINUR NEGATIVE 07/08/2020 1418   NITRITE NEGATIVE 07/08/2020 1418   LEUKOCYTESUR NEGATIVE 07/08/2020 1418   Sepsis Labs: @LABRCNTIP (procalcitonin:4,lacticidven:4) ) Recent Results (from the past 240 hour(s))  Resp Panel by RT-PCR (Flu A&B, Covid) Nasopharyngeal Swab     Status: None   Collection Time: 07/28/20  8:41 PM   Specimen: Nasopharyngeal Swab; Nasopharyngeal(NP) swabs in vial transport medium  Result Value Ref Range Status   SARS Coronavirus 2 by RT PCR NEGATIVE NEGATIVE Final    Comment: (NOTE) SARS-CoV-2 target nucleic acids are NOT DETECTED.  The SARS-CoV-2 RNA is generally detectable in upper  respiratory specimens during the acute phase of infection. The lowest concentration of SARS-CoV-2 viral copies this assay can detect is 138 copies/mL. A negative result does not preclude SARS-Cov-2 infection and should not be used as the sole basis for treatment or other patient management decisions. A negative result may occur with  improper specimen collection/handling, submission of specimen other than nasopharyngeal swab, presence of viral mutation(s) within the areas targeted by this assay, and inadequate number of viral copies(<138 copies/mL). A negative result must be combined with clinical observations, patient history, and epidemiological information. The expected result is Negative.  Fact Sheet for Patients:  EntrepreneurPulse.com.au  Fact Sheet for Healthcare Providers:  IncredibleEmployment.be  This test is no t yet approved or cleared by the [place] FDA and  has been authorized for detection and/or diagnosis of SARS-CoV-2 by FDA under an Emergency Use Authorization (EUA). This EUA will remain  in effect (meaning this test can be used) for the duration of the COVID-19 declaration under Section 564(b)(1) of the Act, 21 U.S.C.section 360bbb-3(b)(1), unless the authorization is terminated  or revoked sooner.       Influenza A by PCR NEGATIVE NEGATIVE Final   Influenza B by PCR NEGATIVE NEGATIVE Final    Comment: (NOTE) The Xpert Xpress SARS-CoV-2/FLU/RSV plus assay is intended as an aid in the diagnosis of influenza from Nasopharyngeal swab specimens and should not be used as a sole basis for treatment. Nasal washings and aspirates are unacceptable for Xpert Xpress SARS-CoV-2/FLU/RSV testing.  Fact Sheet for Patients: EntrepreneurPulse.com.au  Fact Sheet for Healthcare Providers: IncredibleEmployment.be  This test is not yet approved or cleared by the [place] FDA and has been  authorized for detection and/or diagnosis of SARS-CoV-2 by FDA under an Emergency Use Authorization (EUA). This EUA will remain in effect (meaning this test can be used) for the duration of the COVID-19 declaration under Section 564(b)(1) of the Act, 21 U.S.C. section 360bbb-3(b)(1), unless the authorization is terminated or revoked.  Performed at [facility], [address] [place], [place] [address]      Radiological Exams on Admission: CT Angio Abd/Pel W and/or Wo Contrast  Result Date: 07/28/2020 CLINICAL DATA:  Low hemoglobin. Blood transfusions. Dark stool. Vomiting. Dementia. EXAM: CTA ABDOMEN AND PELVIS WITHOUT AND WITH CONTRAST TECHNIQUE: Multidetector CT imaging of the abdomen and pelvis  was performed using the standard protocol during bolus administration of intravenous contrast. Multiplanar reconstructed images and MIPs were obtained and reviewed to evaluate the vascular anatomy. CONTRAST:  141mL OMNIPAQUE IOHEXOL 350 MG/ML SOLN COMPARISON:  CT 07/09/2020 FINDINGS: VASCULAR Aorta: Aorta normal caliber with intimal calcification. Celiac: Celiac trunk is widely patent.  Branch vessels patent. SMA: SMA is widely patent. Branches of the SMA opacified distally. No evidence of contrast extravasation into the bowel. There is significant motion degradation of the imaging. Renals: Bilateral single renal arteries.  Patent IMA: IMA is patent Inflow: Normal Proximal Outflow: Normal Veins: poorly imaged on arterial exam Review of the MIP images confirms the above findings. NON-VASCULAR Lower chest: Bibasilar medial nodule consolidation and bronchiectasis. Hepatobiliary: No focal hepatic lesion.  Gallbladder normal. Pancreas: No pancreatic lesion. Spleen: Spleen is normal Adrenals/Urinary Tract: . Kidneys enhance symmetrically ureters and bladder normal. Stomach/Bowel: Peg tube in the stomach. No bowel obstruction. Colonic diverticulosis without diverticulitis. Lymphatic: No adenopathy  Reproductive: Unremarkable Other: No free fluid.  No abscess or inflammation. Musculoskeletal: No aggressive osseous lesion. IMPRESSION: Exam is degraded by patient respiratory motion. VASCULAR 1. No active extravasation of contrast from the mesenteric vasculature to localize gastrointestinal bleeding. 2. No evidence of arterial vascular occlusion. NON-VASCULAR 1. Bibasilar nodular consolidation and bronchiectasis. Differential includes aspiration pneumonitis versus fungal infection or pneumonia. 2. No acute findings in the abdomen pelvis. Electronically Signed   By: Suzy Bouchard M.D.   On: 07/28/2020 19:33    Assessment/Plan Principal Problem:   Symptomatic anemia Active Problems:   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HCC)   Essential hypertension   CKD (chronic kidney disease), stage IIIa   Hyponatremia   Protein-calorie malnutrition, severe     #1 symptomatic anemia: Recurrent anemia most likely due to bone marrow disease from chronic kidney disease as well as CML.  Patient has no evidence of GI bleed.  At this point we will admit the patient.  Hydrate.  Protonix as well as 1 unit of packed red blood cells will be transfused.  Will follow patient's response.    #2 essential hypertension: Confirm on resume home regimen.  #3 chronic hyponatremia: Possibly secondary to patient's chronic medical problems and hypoalbuminemia.  We will continue to monitor.  Hydrate patient a little.  #4 protein calorie malnutrition severe: Patient has failed to thrive at home.  Wife [place] for him.  Encourage nutrition  #5 chronic kidney disease stage IIIa: Continue to monitor seems close to baseline  #6 history of CML: Patient following up with [place] oncology.  #7 leukocytosis and thrombocytosis: Most likely related to #6.  Continue to defer to oncology.  #8 possible pneumonia on CT: Empiric antibiotics.  Suspicion for fungal pneumonia but less likely.  #9 disposition: Patient is trending towards palliative care  consult.  She appears to be chronically declining.  Wife at bedside.  We will have conversation for Palliative care.   DVT prophylaxis: SCD Code Status: Full code Family Communication: Wife at bedside Disposition Plan: To be determined Consults called: None but GI may be consulted Admission status: Observation  Severity of Illness: The appropriate patient status for this patient is OBSERVATION. Observation status is judged to be reasonable and necessary in order to provide the required intensity of service to ensure the patient's safety. The patient's presenting symptoms, physical exam findings, and initial radiographic and laboratory data in the context of their medical condition is felt to place them at decreased risk for further clinical deterioration. Furthermore, it is anticipated that the patient  will be medically stable for discharge from the hospital within 2 midnights of admission. The following factors support the patient status of observation.   " The patient's presenting symptoms include symptomatic anemia. " The physical exam findings include hemoglobin 7.2. " The initial radiographic and laboratory data are hemoglobin 7.2.     Barbette Merino MD [facility] Pager [phone]  If 7PM-7AM, please contact night-coverage [email or web address] Password [email or web address]  07/28/2020, 10:00 PM

## 2020-07-28 NOTE — ED Provider Notes (Addendum)
[facility] Emergency Department Provider Note  ____________________________________________   Event Date/Time   First MD Initiated Contact with Patient 07/28/20 1748     (approximate)  I have reviewed the triage vital signs and the nursing notes.   HISTORY  Chief Complaint Abnormal Lab   HPI Alex Christensen is a 79 y.o. male with past medical history of CLL, CKD, hypertension who was sent to the ED from [facility] for assessment of anemia with reported hemoglobin down to 6 today.  This was done on routine lab and I am unable to have any further history when I attempted to contact facility.  Patient denies any acute complaints including any abdominal pain back pain chest pain cough shortness of breath or other symptoms.  He does not recall any bleeding specifically in his urine or stool.  He denies any dark stools although further history is somewhat limited secondary to his dementia.  No other history is immediately available.         Past Medical History:  Diagnosis Date  . High blood pressure   . Hypernatremia   . Iron deficiency   . 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 TIA (transient ischemic attack)     Patient Active Problem List   Diagnosis Date Noted  . Symptomatic anemia 07/28/2020  . GI bleeding 06/01/2020  . Protein-calorie malnutrition, severe 03/12/2020  . Pressure injury of skin 03/11/2020  . C. difficile colitis 03/09/2020  . Sepsis secondary to UTI ([place]) 03/09/2020  . Leucocytosis 03/09/2020  . Essential hypertension 12/23/2019  . Hyperlipemia 12/23/2019  . Anemia 12/23/2019  . CKD (chronic kidney disease), stage IIIa 12/23/2019  . 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 Hyponatremia 12/23/2019  . 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 TOOTH EXTRACTION      Prior to Admission  medications   Medication Sig Start Date End Date Taking? Authorizing Provider  Amino Acids-Protein Hydrolys (FEEDING SUPPLEMENT, PRO-STAT SUGAR FREE 64,) LIQD Take 30 mLs by mouth in the morning and at bedtime.   Yes [provider]  aspirin 325 MG EC tablet Take 325 mg by mouth daily.   Yes [provider]  calcium-vitamin D (OSCAL WITH D) 500-200 MG-UNIT tablet Take 1 tablet by mouth 2 (two) times daily.   Yes [provider]  clopidogrel (PLAVIX) 75 MG tablet Take 1 tablet (75 mg total) by mouth daily. 12/24/19  Yes Donzetta Starch, NP  donepezil (ARICEPT) 5 MG tablet Take 5 mg by mouth daily.   Yes [provider]  ferrous sulfate 300 (60 Fe) MG/5ML syrup Take 300 mg by mouth daily.   Yes [provider]  guaifenesin (ROBITUSSIN) 100 MG/5ML syrup Take 400 mg by mouth in the morning, at noon, in the evening, and at bedtime. 07/18/20 08/01/20 Yes [provider]  imatinib (GLEEVEC) 100 MG tablet Take 300 mg by mouth daily. Dissolve in water or apple juice, administer immediately.   Yes [provider]  ondansetron (ZOFRAN-ODT) 4 MG disintegrating tablet Take 4 mg by mouth every 8 (eight) hours as needed for nausea or vomiting.   Yes [provider]  traZODone (DESYREL) 50 MG tablet Take 75 mg by mouth at bedtime.   Yes [provider]  Nutritional Supplements (FEEDING SUPPLEMENT, OSMOLITE 1.5 CAL,) LIQD Place 1,000 mLs into feeding tube continuous. feeding supplement (OSMOLITE  1.5 CAL) liquid 700 mL Dose: 700 mL Freq: Every 24 hours Route: PER TUBE Last Dose: Stopped (03/14/20 0058) Start: 03/11/20 1800 Admin Instructions: Provide Osmolite 1.5 Cal at 50 mL/hr x 14 hrs each evening from 1800-0800 03/15/20   Sidney Ace, MD    Allergies Patient has no known allergies.  No family history on file.  Social History Social History   Tobacco Use  . Smoking status: Former Research scientist (life sciences)  . Smokeless tobacco: Never Used   Substance Use Topics  . Alcohol use: Not Currently    Review of Systems  Review of Systems  Unable to perform ROS: Dementia  Respiratory: Negative for cough and shortness of breath.   Cardiovascular: Negative for chest pain.  Musculoskeletal: Negative for myalgias.      ____________________________________________   PHYSICAL EXAM:  VITAL SIGNS: ED Triage Vitals [07/28/20 1506]  Enc Vitals Group     BP 129/68     Pulse Rate 92     Resp 18     Temp 97.6 F (36.4 C)     Temp Source Oral     SpO2 95 %     Weight 110 lb (49.9 kg)     Height 5\' 5"  (1.651 m)     Head Circumference      [facility] Flow      Pain Score      Pain Loc      Pain Edu?      Excl. in [place]?    Vitals:   07/28/20 1830 07/28/20 1930  BP: 113/66 111/64  Pulse: 76 74  Resp: 14 15  Temp:    SpO2: 99% 100%   Physical Exam Vitals and nursing note reviewed. Exam conducted with a chaperone present.  Constitutional:      Appearance: He is well-developed and well-nourished.  HENT:     Head: Normocephalic and atraumatic.     Right Ear: External ear normal.     Left Ear: External ear normal.     Nose: Nose normal.  Eyes:     Conjunctiva/sclera: Conjunctivae normal.  Cardiovascular:     Rate and Rhythm: Normal rate and regular rhythm.     Heart sounds: No murmur heard.   Pulmonary:     Effort: Pulmonary effort is normal. No respiratory distress.     Breath sounds: Normal breath sounds.  Abdominal:     Palpations: Abdomen is soft.     Tenderness: There is no abdominal tenderness.  Genitourinary:    Rectum: Guaiac result negative.  Musculoskeletal:        General: No edema.     Cervical back: Neck supple.  Skin:    General: Skin is warm and dry.  Neurological:     Mental Status: He is alert. Mental status is at baseline. He is disoriented.  Psychiatric:        Mood and Affect: Mood and affect and mood normal.      ____________________________________________   LABS (all labs ordered are  listed, but only abnormal results are displayed)  Labs Reviewed  COMPREHENSIVE METABOLIC PANEL - Abnormal; Notable for the following components:      Result Value   Sodium 132 (*)    Chloride 97 (*)    BUN 34 (*)    Calcium 8.4 (*)    Total Protein 5.4 (*)    Albumin 2.2 (*)    All other components within normal limits  CBC - Abnormal; Notable for the following components:   WBC 12.3 (*)  RBC 2.30 (*)    Hemoglobin 7.2 (*)    HCT 21.5 (*)    RDW 16.0 (*)    Platelets 554 (*)    All other components within normal limits  RETICULOCYTES - Abnormal; Notable for the following components:   Retic Ct Pct 3.7 (*)    RBC. 2.30 (*)    Immature Retic Fract 33.2 (*)    All other components within normal limits  RESP PANEL BY RT-PCR (FLU A&B, COVID) ARPGX2  PROTIME-INR  POC OCCULT BLOOD, ED  TYPE AND SCREEN   ____________________________________________  EKG  Sinus rhythm with a ventricular rate of 92, normal axis, unremarkable intervals, incomplete right bundle branch block and some nonspecific ST changes in anterior and inferior leads. ____________________________________________  RADIOLOGY  ED MD interpretation: No clear evidence of acute bleeding on CTA abdomen or pelvis.  Official radiology report(s): CT Angio Abd/Pel W and/or Wo Contrast  Result Date: 07/28/2020 CLINICAL DATA:  Low hemoglobin. Blood transfusions. Dark stool. Vomiting. Dementia. EXAM: CTA ABDOMEN AND PELVIS WITHOUT AND WITH CONTRAST TECHNIQUE: Multidetector CT imaging of the abdomen and pelvis was performed using the standard protocol during bolus administration of intravenous contrast. Multiplanar reconstructed images and MIPs were obtained and reviewed to evaluate the vascular anatomy. CONTRAST:  174mL OMNIPAQUE IOHEXOL 350 MG/ML SOLN COMPARISON:  CT 07/09/2020 FINDINGS: VASCULAR Aorta: Aorta normal caliber with intimal calcification. Celiac: Celiac trunk is widely patent.  Branch vessels patent. SMA: SMA is  widely patent. Branches of the SMA opacified distally. No evidence of contrast extravasation into the bowel. There is significant motion degradation of the imaging. Renals: Bilateral single renal arteries.  Patent IMA: IMA is patent Inflow: Normal Proximal Outflow: Normal Veins: poorly imaged on arterial exam Review of the MIP images confirms the above findings. NON-VASCULAR Lower chest: Bibasilar medial nodule consolidation and bronchiectasis. Hepatobiliary: No focal hepatic lesion.  Gallbladder normal. Pancreas: No pancreatic lesion. Spleen: Spleen is normal Adrenals/Urinary Tract: . Kidneys enhance symmetrically ureters and bladder normal. Stomach/Bowel: Peg tube in the stomach. No bowel obstruction. Colonic diverticulosis without diverticulitis. Lymphatic: No adenopathy Reproductive: Unremarkable Other: No free fluid.  No abscess or inflammation. Musculoskeletal: No aggressive osseous lesion. IMPRESSION: Exam is degraded by patient respiratory motion. VASCULAR 1. No active extravasation of contrast from the mesenteric vasculature to localize gastrointestinal bleeding. 2. No evidence of arterial vascular occlusion. NON-VASCULAR 1. Bibasilar nodular consolidation and bronchiectasis. Differential includes aspiration pneumonitis versus fungal infection or pneumonia. 2. No acute findings in the abdomen pelvis. Electronically Signed   By: Suzy Bouchard M.D.   On: 07/28/2020 19:33    ____________________________________________   PROCEDURES  Procedure(s) performed (including Critical Care):  .1-3 Lead EKG Interpretation Performed by: Lucrezia Starch, MD Authorized by: Lucrezia Starch, MD     Interpretation: normal     ECG rate assessment: normal     Rhythm: sinus rhythm     Ectopy: none     Conduction: normal       ____________________________________________   INITIAL IMPRESSION / ASSESSMENT AND PLAN / ED COURSE      Patient presents above to history exam for assessment of anemia  taken on labs at his nursing facility earlier today. He was reportedly anemic to 6.6. I did attempt to obtain some history from nursing facility but was unable to reach anyone. Patient denies any acute symptoms and any recent bleeding or melanotic stools although unclear how accurate this is as he does have a history of dementia and is not oriented. He  is afebrile hemodynamically stable on arrival. He denies any acute chest pain or shortness of breath and does not appear to be acutely symptomatic at this time.  There are no findings on exam to suggest an acute traumatic injury. There are also no findings on history or exam to suggest an acute infectious process. CT obtained shows no evidence of acute bleeding. Patient does have some opacities in lower lung lobes although he does not have any evidence of shortness of breath and denies any cough. He also has no fever or cough and I have low suspicion for acute infection. CBC remarkable for WBC count of 12.3, hemoglobin of 7.2 and platelets of 554. This hemoglobin is compared to that obtained on 11/27 that was 9.8. CMP shows no significant electrolyte or metabolic derangements. Albumin is low at 2.2. INR is unremarkable.  I did discuss patient's presentation work-up with on-call gastroenterologist Dr. Marius Ditch who agreed with observation given acute drop in hemoglobin and stated GI would see the patient tomorrow. I'll plan to admit to medicine service for further evaluation management.  ____________________________________________   FINAL CLINICAL IMPRESSION(S) / ED DIAGNOSES  Final diagnoses:  Anemia, unspecified type    Medications  pantoprazole (PROTONIX) injection 40 mg (40 mg Intravenous Given 07/28/20 1822)  iohexol (OMNIPAQUE) 350 MG/ML injection 100 mL (100 mLs Intravenous Contrast Given 07/28/20 1851)     ED Discharge Orders    None       Note:  This document was prepared using Dragon voice recognition software and may include  unintentional dictation errors.   Lucrezia Starch, MD 07/28/20 2031    Lucrezia Starch, MD 07/29/20 1038

## 2020-07-28 NOTE — ED Triage Notes (Addendum)
PT to ED via EMS from [facility] for low HGB. PT states hx of blood transfusion, denies recent dark or bloody stools or vomiting, however questionable historian d/t dementia. PT appears mildly pale. VSS. HGB last month was 9.8, now down to 6's.  PT is a full code, has papers with him

## 2020-07-29 DIAGNOSIS — C9210 Chronic myeloid leukemia, BCR/ABL-positive, not having achieved remission: Principal | ICD-10-CM

## 2020-07-29 DIAGNOSIS — D649 Anemia, unspecified: Secondary | ICD-10-CM | POA: Diagnosis present

## 2020-07-29 DIAGNOSIS — F0390 Unspecified dementia without behavioral disturbance: Secondary | ICD-10-CM | POA: Diagnosis present

## 2020-07-29 DIAGNOSIS — Z20822 Contact with and (suspected) exposure to covid-19: Secondary | ICD-10-CM | POA: Diagnosis present

## 2020-07-29 DIAGNOSIS — D75839 Thrombocytosis, unspecified: Secondary | ICD-10-CM | POA: Diagnosis present

## 2020-07-29 DIAGNOSIS — Z8673 Personal history of transient ischemic attack (TIA), and cerebral infarction without residual deficits: Secondary | ICD-10-CM | POA: Diagnosis not present

## 2020-07-29 DIAGNOSIS — E871 Hypo-osmolality and hyponatremia: Secondary | ICD-10-CM

## 2020-07-29 DIAGNOSIS — I129 Hypertensive chronic kidney disease with stage 1 through stage 4 chronic kidney disease, or unspecified chronic kidney disease: Secondary | ICD-10-CM | POA: Diagnosis present

## 2020-07-29 DIAGNOSIS — E785 Hyperlipidemia, unspecified: Secondary | ICD-10-CM | POA: Diagnosis present

## 2020-07-29 DIAGNOSIS — Z79899 Other long term (current) drug therapy: Secondary | ICD-10-CM | POA: Diagnosis not present

## 2020-07-29 DIAGNOSIS — Z931 Gastrostomy status: Secondary | ICD-10-CM | POA: Diagnosis not present

## 2020-07-29 DIAGNOSIS — D631 Anemia in chronic kidney disease: Secondary | ICD-10-CM | POA: Diagnosis present

## 2020-07-29 DIAGNOSIS — J189 Pneumonia, unspecified organism: Secondary | ICD-10-CM | POA: Diagnosis present

## 2020-07-29 DIAGNOSIS — Z681 Body mass index (BMI) 19 or less, adult: Secondary | ICD-10-CM | POA: Diagnosis not present

## 2020-07-29 DIAGNOSIS — E43 Unspecified severe protein-calorie malnutrition: Secondary | ICD-10-CM | POA: Diagnosis present

## 2020-07-29 DIAGNOSIS — N1831 Chronic kidney disease, stage 3a: Secondary | ICD-10-CM | POA: Diagnosis present

## 2020-07-29 DIAGNOSIS — R1310 Dysphagia, unspecified: Secondary | ICD-10-CM | POA: Diagnosis present

## 2020-07-29 DIAGNOSIS — Z7982 Long term (current) use of aspirin: Secondary | ICD-10-CM | POA: Diagnosis not present

## 2020-07-29 DIAGNOSIS — Z87891 Personal history of nicotine dependence: Secondary | ICD-10-CM | POA: Diagnosis not present

## 2020-07-29 DIAGNOSIS — Z7902 Long term (current) use of antithrombotics/antiplatelets: Secondary | ICD-10-CM | POA: Diagnosis not present

## 2020-07-29 LAB — COMPREHENSIVE METABOLIC PANEL
ALT: 37 U/L (ref 0–44)
AST: 27 U/L (ref 15–41)
Albumin: 2.1 g/dL — ABNORMAL LOW (ref 3.5–5.0)
Alkaline Phosphatase: 97 U/L (ref 38–126)
Anion gap: 8 (ref 5–15)
BUN: 28 mg/dL — ABNORMAL HIGH (ref 8–23)
CO2: 25 mmol/L (ref 22–32)
Calcium: 8.1 mg/dL — ABNORMAL LOW (ref 8.9–10.3)
Chloride: 96 mmol/L — ABNORMAL LOW (ref 98–111)
Creatinine, Ser: 0.69 mg/dL (ref 0.61–1.24)
GFR, Estimated: >60 mL/min (ref >60)
Glucose, Bld: 107 mg/dL — ABNORMAL HIGH (ref 70–99)
Potassium: 4.3 mmol/L (ref 3.5–5.1)
Sodium: 129 mmol/L — ABNORMAL LOW (ref 135–145)
Total Bilirubin: 0.6 mg/dL (ref 0.3–1.2)
Total Protein: 5.1 g/dL — ABNORMAL LOW (ref 6.5–8.1)

## 2020-07-29 LAB — CBC
HCT: 29 % — ABNORMAL LOW (ref 39.0–52.0)
Hemoglobin: 9.7 g/dL — ABNORMAL LOW (ref 13.0–17.0)
MCH: 30.7 pg (ref 26.0–34.0)
MCHC: 33.4 g/dL (ref 30.0–36.0)
MCV: 91.8 fL (ref 80.0–100.0)
Platelets: 495 10*3/uL — ABNORMAL HIGH (ref 150–400)
RBC: 3.16 MIL/uL — ABNORMAL LOW (ref 4.22–5.81)
RDW: 15.9 % — ABNORMAL HIGH (ref 11.5–15.5)
WBC: 7.9 10*3/uL (ref 4.0–10.5)
nRBC: 0 % (ref 0.0–0.2)

## 2020-07-29 LAB — HEMOGLOBIN AND HEMATOCRIT, BLOOD
HCT: 27.7 % — ABNORMAL LOW (ref 39.0–52.0)
Hemoglobin: 9.2 g/dL — ABNORMAL LOW (ref 13.0–17.0)

## 2020-07-29 LAB — PREPARE RBC (CROSSMATCH)

## 2020-07-29 LAB — SODIUM: Sodium: 131 mmol/L — ABNORMAL LOW (ref 135–145)

## 2020-07-29 MED ORDER — SODIUM CHLORIDE 0.9 % IV SOLN
500.0000 mg | INTRAVENOUS | Status: DC
  Administered 2020-07-29 – 2020-08-01 (×4): 500 mg via INTRAVENOUS
  Filled 2020-07-29 (×5): qty 500

## 2020-07-29 MED ORDER — SODIUM CHLORIDE 0.9 % IV SOLN
1.0000 g | INTRAVENOUS | Status: DC
  Administered 2020-07-29 – 2020-08-01 (×4): 1 g via INTRAVENOUS
  Filled 2020-07-29: qty 1
  Filled 2020-07-29 (×4): qty 10

## 2020-07-29 MED ORDER — FREE WATER
30.0000 mL | Status: DC
  Administered 2020-07-29 – 2020-07-30 (×4): 30 mL

## 2020-07-29 MED ORDER — OSMOLITE 1.5 CAL PO LIQD
1000.0000 mL | ORAL | Status: DC
  Administered 2020-07-29 – 2020-08-01 (×4): 1000 mL

## 2020-07-29 MED ORDER — IPRATROPIUM-ALBUTEROL 0.5-2.5 (3) MG/3ML IN SOLN: 3.0000 mL | Freq: Four times a day (QID) | RESPIRATORY_TRACT | Status: DC | PRN

## 2020-07-29 NOTE — Progress Notes (Signed)
PROGRESS NOTE    Alex Christensen  GYB:[id number] DOB: 03/07/1941 DOA: 07/28/2020 PCP: [place] Pink, MD    Assessment & Plan:   Principal Problem:   Symptomatic anemia Active Problems:   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[place])   Essential hypertension   CKD (chronic kidney disease), stage IIIa   Hyponatremia   Protein-calorie malnutrition, severe   Symptomatic anemia: likely secondary to CKD & CML. S/p 1 unit of pRBCs transfused. Will continue to monitor   Possible pneumonia: as per CT scan. Will start IV ceftriaxone and azithromycin x 5 days total and bronchodilators. Encourage incentive spirometry   Chronic hyponatremia: labile. Will continue to monitor   Severe protein calorie malnutrition & dysphagia: continue w/ tube feeds, started in June 2021  CKDIIIa: Cr is likely at baseline or better than baseline   CML: will continue on home dose of gleevac.   Leukocytosis: resolved  Thrombocytosis: etiology unclear, trending down. Will continue to monitor     DVT prophylaxis: SCDs Code Status:  Full  Family Communication: discussed pt's care w/ pt's wife, [name], and answered her questions Disposition Plan: depends on PT/OT recs   Status is: Observation  The patient remains OBS appropriate and will d/c before 2 midnights.  Dispo: The patient is from: Home              Anticipated d/c is to: SNF              Anticipated d/c date is: 3 days              Patient currently is not medically stable to d/c.         Consultants:      Procedures:    Antimicrobials:   Subjective: Pt c/o fatigue   Objective: Vitals:   07/29/20 0530 07/29/20 0630 07/29/20 0700 07/29/20 0701  BP: 129/72 124/81 130/69   Pulse: 69 71 71   Resp: 14 13 13 13   Temp:      TempSrc:      SpO2: 96% 95%    Weight:      Height:        Intake/Output Summary (Last 24 hours) at 07/29/2020 0802 Last data filed at 07/29/2020 0354 Gross per 24 hour  Intake 410 ml  Output --  Net  410 ml   Filed Weights   07/28/20 1506  Weight: 49.9 kg    Examination:  General exam: Appears calm but uncomfortable. Appears older than stated age   Respiratory system: Clear to auscultation. Respiratory effort normal. Cardiovascular system: S1 & S2 + No  rubs, gallops or clicks.  Gastrointestinal system: Abdomen is nondistended, soft and nontender. Normal bowel sounds heard. Central nervous system: Alert and awake Psychiatry: Judgement and insight appear abnormal. Flat mood and affect    Data Reviewed: I have personally reviewed following labs and imaging studies  CBC: Recent Labs  Lab 07/28/20 1510 07/29/20 0432  WBC 12.3* 7.9  HGB 7.2* 9.7*  HCT 21.5* 29.0*  MCV 93.5 91.8  PLT 554* 876*   Basic Metabolic Panel: Recent Labs  Lab 07/28/20 1510 07/29/20 0432  NA 132* 129*  K 4.6 4.3  CL 97* 96*  CO2 27 25  GLUCOSE 94 107*  BUN 34* 28*  CREATININE 0.67 0.69  CALCIUM 8.4* 8.1*   GFR: Estimated Creatinine Clearance: 52 mL/min (by C-G formula based on SCr of 0.69 mg/dL). Liver Function Tests: Recent Labs  Lab 07/28/20 1510 07/29/20 0432  AST 26 27  ALT 42 37  ALKPHOS 115 97  BILITOT 0.4 0.6  PROT 5.4* 5.1*  ALBUMIN 2.2* 2.1*   No results for input(s): LIPASE, AMYLASE in the last 168 hours. No results for input(s): AMMONIA in the last 168 hours. Coagulation Profile: Recent Labs  Lab 07/28/20 1510  INR 0.9   Cardiac Enzymes: No results for input(s): CKTOTAL, CKMB, CKMBINDEX, TROPONINI in the last 168 hours. BNP (last 3 results) No results for input(s): PROBNP in the last 8760 hours. HbA1C: No results for input(s): HGBA1C in the last 72 hours. CBG: No results for input(s): GLUCAP in the last 168 hours. Lipid Profile: No results for input(s): CHOL, HDL, LDLCALC, TRIG, CHOLHDL, LDLDIRECT in the last 72 hours. Thyroid Function Tests: No results for input(s): TSH, T4TOTAL, FREET4, T3FREE, THYROIDAB in the last 72 hours. Anemia Panel: Recent Labs     07/28/20 2014  RETICCTPCT 3.7*   Sepsis Labs: No results for input(s): PROCALCITON, LATICACIDVEN in the last 168 hours.  Recent Results (from the past 240 hour(s))  Resp Panel by RT-PCR (Flu A&B, Covid) Nasopharyngeal Swab     Status: None   Collection Time: 07/28/20  8:41 PM   Specimen: Nasopharyngeal Swab; Nasopharyngeal(NP) swabs in vial transport medium  Result Value Ref Range Status   SARS Coronavirus 2 by RT PCR NEGATIVE NEGATIVE Final    Comment: (NOTE) SARS-CoV-2 target nucleic acids are NOT DETECTED.  The SARS-CoV-2 RNA is generally detectable in upper respiratory specimens during the acute phase of infection. The lowest concentration of SARS-CoV-2 viral copies this assay can detect is 138 copies/mL. A negative result does not preclude SARS-Cov-2 infection and should not be used as the sole basis for treatment or other patient management decisions. A negative result may occur with  improper specimen collection/handling, submission of specimen other than nasopharyngeal swab, presence of viral mutation(s) within the areas targeted by this assay, and inadequate number of viral copies(<138 copies/mL). A negative result must be combined with clinical observations, patient history, and epidemiological information. The expected result is Negative.  Fact Sheet for Patients:  EntrepreneurPulse.com.au  Fact Sheet for Healthcare Providers:  IncredibleEmployment.be  This test is no t yet approved or cleared by the [place] FDA and  has been authorized for detection and/or diagnosis of SARS-CoV-2 by FDA under an Emergency Use Authorization (EUA). This EUA will remain  in effect (meaning this test can be used) for the duration of the COVID-19 declaration under Section 564(b)(1) of the Act, 21 U.S.C.section 360bbb-3(b)(1), unless the authorization is terminated  or revoked sooner.       Influenza A by PCR NEGATIVE NEGATIVE Final    Influenza B by PCR NEGATIVE NEGATIVE Final    Comment: (NOTE) The Xpert Xpress SARS-CoV-2/FLU/RSV plus assay is intended as an aid in the diagnosis of influenza from Nasopharyngeal swab specimens and should not be used as a sole basis for treatment. Nasal washings and aspirates are unacceptable for Xpert Xpress SARS-CoV-2/FLU/RSV testing.  Fact Sheet for Patients: EntrepreneurPulse.com.au  Fact Sheet for Healthcare Providers: IncredibleEmployment.be  This test is not yet approved or cleared by the [place] FDA and has been authorized for detection and/or diagnosis of SARS-CoV-2 by FDA under an Emergency Use Authorization (EUA). This EUA will remain in effect (meaning this test can be used) for the duration of the COVID-19 declaration under Section 564(b)(1) of the Act, 21 U.S.C. section 360bbb-3(b)(1), unless the authorization is terminated or revoked.  Performed at [facility], [address], [place], [place] [address]  Radiology Studies: CT Angio Abd/Pel W and/or Wo Contrast  Result Date: 07/28/2020 CLINICAL DATA:  Low hemoglobin. Blood transfusions. Dark stool. Vomiting. Dementia. EXAM: CTA ABDOMEN AND PELVIS WITHOUT AND WITH CONTRAST TECHNIQUE: Multidetector CT imaging of the abdomen and pelvis was performed using the standard protocol during bolus administration of intravenous contrast. Multiplanar reconstructed images and MIPs were obtained and reviewed to evaluate the vascular anatomy. CONTRAST:  175mL OMNIPAQUE IOHEXOL 350 MG/ML SOLN COMPARISON:  CT 07/09/2020 FINDINGS: VASCULAR Aorta: Aorta normal caliber with intimal calcification. Celiac: Celiac trunk is widely patent.  Branch vessels patent. SMA: SMA is widely patent. Branches of the SMA opacified distally. No evidence of contrast extravasation into the bowel. There is significant motion degradation of the imaging. Renals: Bilateral single renal arteries.   Patent IMA: IMA is patent Inflow: Normal Proximal Outflow: Normal Veins: poorly imaged on arterial exam Review of the MIP images confirms the above findings. NON-VASCULAR Lower chest: Bibasilar medial nodule consolidation and bronchiectasis. Hepatobiliary: No focal hepatic lesion.  Gallbladder normal. Pancreas: No pancreatic lesion. Spleen: Spleen is normal Adrenals/Urinary Tract: . Kidneys enhance symmetrically ureters and bladder normal. Stomach/Bowel: Peg tube in the stomach. No bowel obstruction. Colonic diverticulosis without diverticulitis. Lymphatic: No adenopathy Reproductive: Unremarkable Other: No free fluid.  No abscess or inflammation. Musculoskeletal: No aggressive osseous lesion. IMPRESSION: Exam is degraded by patient respiratory motion. VASCULAR 1. No active extravasation of contrast from the mesenteric vasculature to localize gastrointestinal bleeding. 2. No evidence of arterial vascular occlusion. NON-VASCULAR 1. Bibasilar nodular consolidation and bronchiectasis. Differential includes aspiration pneumonitis versus fungal infection or pneumonia. 2. No acute findings in the abdomen pelvis. Electronically Signed   By: Suzy Bouchard M.D.   On: 07/28/2020 19:33        Scheduled Meds: . sodium chloride   Intravenous Once  . aspirin  325 mg Oral Daily  . calcium-vitamin D  1 tablet Oral BID  . clopidogrel  75 mg Oral Daily  . donepezil  5 mg Oral Daily  . feeding supplement (PRO-STAT SUGAR FREE 64)  30 mL Oral TID BM  . ferrous sulfate  300 mg Oral Daily  . imatinib  300 mg Oral Daily  . pantoprazole (PROTONIX) IV  40 mg Intravenous Q12H  . traZODone  75 mg Oral QHS   Continuous Infusions: . feeding supplement (OSMOLITE 1.5 CAL) 1,000 mL (07/29/20 0049)     LOS: 0 days    Time spent: 30 mins     Wyvonnia Dusky, MD [facility] Pager [phone]  If 7PM-7AM, please contact night-coverage 07/29/2020, 8:02 AM

## 2020-07-29 NOTE — Progress Notes (Addendum)
Initial Nutrition Assessment  DOCUMENTATION CODES:   Severe malnutrition in context of chronic illness  INTERVENTION:   Osmolite 1.5 @ 6ml/hr  Free water flushes 69ml q4 hours to maintain tube patency   Regimen provides 1620kcal/day, 68g/day protein and 1063ml/day free water   Pt at high refeed risk; recommend monitor potassium, magnesium and phosphorus labs daily until stable  NUTRITION DIAGNOSIS:   Severe Malnutrition related to cancer and cancer related treatments as evidenced by 27 percent weight loss in 1 year, severe fat depletions ,severe muscle depletions.  GOAL:   Patient will meet greater than or equal to 90% of their needs  MONITOR:   Labs,Weight trends,Skin,I & O's,TF tolerance  REASON FOR ASSESSMENT:   Other (Comment) (nutrition support patient)    ASSESSMENT:   79 y.o. male with medical history significant of CML, essential hypertension, recurrent anemia, chronic kidney disease stage III, TIA, dementia, chronic G-tube secondary to dysphagia, protein calorie malnutrition who is admitted with recurrent anemia and possible PNA   Pt s/p IR G-tube placement 01/15/2020  Met with pt and pt's wife in room today. Pt is unable to provide any meaningful history so history is obtained from pt's wife at bedside. Wife is unsure of what pt's home tube feed regimen currently is. She reports that he is getting the Osmolite 1.5 nocturnally but she believes that they have been giving it over a longer period of time overnight than when he was last seen by this nutrition department in 02/2020. RD attempted to call [facility] but was unable to get in touch with anyone at this facility. Pt was discharged in July with home tube feed regimen of Osmolite 1.5 $RemoveBefo'@50ml'VsWJlZLtgvc$ /hr x 14 hrs from 1800-0800 (wife believes that it now runs from 1400-0800). During pt's last RD visit in July, he was noted to be meeting about 25% of his estimated needs via oral intake so tube feeds were adjusted to meet ~75%  of pt's estimated needs. Per wife report, pt's oral intake has declined and he has become mainly bed bound as sitting in a chair is no longer safe for him. Per chart, pt is down 30lbs(27%) over the past year with 11lbs(12%) of that weight loss being since he had his G-tube placed; this is significant weight loss. Spoke with wife, will change pt over to continuous feeds as he is now bed bound; this will increase pt's calorie and protein intake. Tube feeds currently running at 47ml/hr and pt is tolerating well. Pt is on a clear liquid diet. Palliative care follows this patient at [facility].   Medications reviewed and include: aspirin, oscal with D, plavix, ferrous sulfate, protonix, azithromycin, ceftriaxone   Labs reviewed: Na 129(L), BUN 28(H) Hgb 9.7(L), Hct 29.0(L)  NUTRITION - FOCUSED PHYSICAL EXAM:  Flowsheet Row Most Recent Value  Orbital Region Severe depletion  Upper Arm Region Severe depletion  Thoracic and Lumbar Region Severe depletion  Buccal Region Severe depletion  Temple Region Severe depletion  Clavicle Bone Region Severe depletion  Clavicle and Acromion Bone Region Severe depletion  Scapular Bone Region Severe depletion  Dorsal Hand Severe depletion  Patellar Region Severe depletion  Anterior Thigh Region Severe depletion  Posterior Calf Region Severe depletion  Edema (RD Assessment) None  Hair Reviewed  Eyes Reviewed  Mouth Reviewed  Skin Reviewed  Nails Reviewed     Diet Order:   Diet Order            Diet clear liquid Room service appropriate? Yes; Fluid consistency:  Thin  Diet effective now                EDUCATION NEEDS:   No education needs have been identified at this time  Skin:  Skin Assessment: Reviewed RN Assessment (Stage II buttocks- noted from 11/27)  Last BM:  PTA  Height:   Ht Readings from Last 1 Encounters:  07/28/20 5' 5"  (1.651 m)    Weight:   Wt Readings from Last 1 Encounters:  07/29/20 37.5 kg    Ideal Body Weight:   61.8 kg  BMI:  Body mass index is 13.76 kg/m.  Estimated Nutritional Needs:   Kcal:  1500-1700kcal/day  Protein:  65-75g/day  Fluid:  1.0-1.2L/day  Koleen Distance MS, RD, LDN Please refer to [facility] for RD and/or RD on-call/weekend/after hours pager

## 2020-07-29 NOTE — ED Notes (Signed)
Patient incontinent of urine. Peri care performed. Brief and bed linens changed. External urinary catheter placed. Patient positioned in bed for comfort.

## 2020-07-29 NOTE — ED Notes (Signed)
Admitting MD at bedside.

## 2020-07-29 NOTE — ED Notes (Signed)
Pt given warm blankets at this time. Pt denies any further needs

## 2020-07-29 NOTE — Evaluation (Signed)
Physical Therapy Evaluation Patient Details Name: Alex Christensen MRN: [id number] DOB: 1940/10/20 Today's Date: 07/29/2020   History of Present Illness  Alex Christensen is a 79 y.o. male with medical history significant of CML, essential hypertension, recurrent anemia, chronic kidney disease stage III, TIA, dementia, protein calorie malnutrition who was seen in the hospital on October 28 with significant anemia work-up was done including evaluation for source of his anemia.  Clinical Impression  Pt admitted with above diagnosis. Pt currently with functional limitations due to the deficits listed below (see "PT Problem List"). Upon entry, pt in bed, awake and agreeable to participate. The pt is alert, pleasant, interactive, be somewhat HOH and has difficulty following simple commands conssitently. Wife provides info regarding prior level of function, both in tolerance and independence. Patient's performance this date reveals decreased ability, independence, and tolerance in performing all basic mobility required for performance of activities of daily living, however is currently at his baseline level of function. Pt requires additional DME, close physical assistance, and cues for safe participate in mobility. Patient is at baseline, all education completed, and time is given to address all questions/concerns. No additional skilled PT services needed at this time, PT signing off. PT recommends daily mobility with nursing staff as needed to maintain routine.        Follow Up Recommendations Supervision for mobility/OOB;Supervision - Intermittent    Equipment Recommendations  None recommended by PT    Recommendations for Other Services       Precautions / Restrictions Precautions Precautions: Fall Restrictions Weight Bearing Restrictions: No      Mobility  Bed Mobility Overal bed mobility: Needs Assistance Bed Mobility: Supine to Sit;Sit to Supine     Supine to sit: Total assist Sit to  supine: Total assist   General bed mobility comments: unable to sit at EOB due to seevre tightness/pain in hips, continual posterior lean    Transfers                 General transfer comment: not attempted 2/2 poor sitting balance  Ambulation/Gait                Stairs            Wheelchair Mobility    Modified Rankin (Stroke Patients Only)       Balance Overall balance assessment: Needs assistance Sitting-balance support: Single extremity supported;Feet unsupported Sitting balance-Leahy Scale: Zero                                       Pertinent Vitals/Pain Pain Assessment: No/denies pain Faces Pain Scale: Hurts a little bit Pain Location: grimace with ROM of bilat hips Pain Descriptors / Indicators: Dull;Discomfort Pain Intervention(s): Limited activity within patient's tolerance;Repositioned    Home Living Family/patient expects to be discharged to:: Skilled nursing facility                 Additional Comments: wife reports last time he stood was this summer. From SNF - [facility]    Prior Function Level of Independence: Needs assistance   Gait / Transfers Assistance Needed: Not working with therapy 2/2 [email or web address], wife reports bed bound 3+ months  ADL's / Homemaking Assistance Needed: Assist for tube feeds and ADLs from staff. Pt self-feeds thickened liquids  Comments: has been at [facility], now a resident, mostly been bound for several months.     Hand Dominance  Dominant Hand: Left    Extremity/Trunk Assessment   Upper Extremity Assessment Upper Extremity Assessment:  (grips and elbows grossly 3/5 bilat)    Lower Extremity Assessment Lower Extremity Assessment:  (hip flexion limited to ~90 degreesl; ankle also grossly limited)       Communication   Communication: HOH  Cognition Arousal/Alertness: Awake/alert Behavior During Therapy: WFL for tasks assessed/performed Overall Cognitive Status: Within Functional Limits  for tasks assessed                                        General Comments      Exercises Other Exercises Other Exercises: Pt educated re: OT role, DME recs, d/Christensen recs, falls prevention Other Exercises: LBD, UBD, sup<>sit, sitting/standing balance/tolerance   Assessment/Plan    PT Assessment All further PT needs can be met in the next venue of care  PT Problem List Decreased strength;Decreased range of motion;Decreased cognition;Decreased mobility       PT Treatment Interventions Patient/family education    PT Goals (Current goals can be found in the Care Plan section)  Acute Rehab PT Goals Patient Stated Goal: To return to [facility] PT Goal Formulation: All assessment and education complete, DC therapy    Frequency     Barriers to discharge        Co-evaluation               AM-PAC PT "6 Clicks" Mobility  Outcome Measure Help needed turning from your back to your side while in a flat bed without using bedrails?: Total Help needed moving from lying on your back to sitting on the side of a flat bed without using bedrails?: Total Help needed moving to and from a bed to a chair (including a wheelchair)?: Total Help needed standing up from a chair using your arms (e.g., wheelchair or bedside chair)?: Total Help needed to walk in hospital room?: Total Help needed climbing 3-5 steps with a railing? : Total 6 Click Score: 6    End of Session   Activity Tolerance: Patient tolerated treatment well;Patient limited by fatigue;Patient limited by pain Patient left: in bed;with family/visitor present;with call bell/phone within reach Nurse Communication: Mobility status PT Visit Diagnosis: Muscle weakness (generalized) (M62.81);Other abnormalities of gait and mobility (R26.89);Adult, failure to thrive (R62.7)    Time: 3710-6269 PT Time Calculation (min) (ACUTE ONLY): 31 min   Charges:   PT Evaluation $PT Eval Low Complexity: 1 Low          3:19 PM,  07/29/20 Etta Grandchild, PT, DPT Physical Therapist - [facility]  [phone] ([place])    Alex Christensen 07/29/2020, 3:18 PM

## 2020-07-29 NOTE — ED Notes (Signed)
This RN at bedside to administer PO medication. Pt stating he doesn;t know if he can tolerate PO. Admitting MD at bedside. MD stating she will reach out to family to see how medication is administered.

## 2020-07-29 NOTE — Evaluation (Signed)
Occupational Therapy Evaluation Patient Details Name: Alex Christensen MRN: [id number] DOB: March 09, 1941 Today's Date: 07/29/2020    History of Present Illness Alex Christensen is a 79 y.o. male with medical history significant of CML, essential hypertension, recurrent anemia, chronic kidney disease stage III, TIA, dementia, protein calorie malnutrition who was seen in the hospital on October 28 with significant anemia work-up was done including evaluation for source of his anemia.   Clinical Impression   Alex Christensen was seen for OT evaluation this date. Prior to hospital admission, pt was requiring assist at SNF ([facility]) for all mobility, toileting in depends at bed level, and assist for tube feeds (self-feeds liquid diet). Wife at bed side to assist with PLOF. Pt presents to acute OT demonstrating impaired ADL performance and functional mobility 2/2 functional strength/ROM/balance deficits and poor insight into deficits. Pt currently requires MOD A don/doff gown at bed level. MAX A for LB access at bed level. MOD A sitting balance - requires BUE support. Initiates BLE for bed mobility, ultimately requires MAX A sup<>sit. Pt would benefit from skilled OT to address noted impairments and functional limitations (see below for any additional details) in order to maximize safety and independence while minimizing falls risk and caregiver burden. Upon hospital discharge, recommend STR to maximize pt safety and return to PLOF.     Follow Up Recommendations  SNF    Equipment Recommendations  None recommended by OT    Recommendations for Other Services       Precautions / Restrictions Precautions Precautions: Fall Restrictions Weight Bearing Restrictions: No      Mobility Bed Mobility Overal bed mobility: Needs Assistance Bed Mobility: Supine to Sit;Sit to Supine     Supine to sit: Mod assist;HOB elevated Sit to supine: Mod assist;HOB elevated        Transfers                  General transfer comment: not attempted 2/2 poor sitting balance    Balance Overall balance assessment: Needs assistance Sitting-balance support: Bilateral upper extremity supported;Feet unsupported Sitting balance-Leahy Scale: Poor                                     ADL either performed or assessed with clinical judgement   ADL Overall ADL's : Needs assistance/impaired                                       General ADL Comments: MOD A don/doff gown at bed level. MAX A for LB access at bed level. MOD A sitting balance - requires BUE support                  Pertinent Vitals/Pain Pain Assessment: Faces Faces Pain Scale: Hurts a little bit Pain Location: general malaise Pain Descriptors / Indicators: Dull;Discomfort Pain Intervention(s): Limited activity within patient's tolerance;Repositioned     Hand Dominance Left   Extremity/Trunk Assessment Upper Extremity Assessment Upper Extremity Assessment: Generalized weakness   Lower Extremity Assessment Lower Extremity Assessment: Generalized weakness       Communication Communication Communication: HOH   Cognition Arousal/Alertness: Awake/alert Behavior During Therapy: WFL for tasks assessed/performed Overall Cognitive Status: Within Functional Limits for tasks assessed  General Comments       Exercises Exercises: Other exercises Other Exercises Other Exercises: Pt educated re: OT role, DME recs, d/c recs, falls prevention Other Exercises: LBD, UBD, sup<>sit, sitting/standing balance/tolerance   Shoulder Instructions      Home Living Family/patient expects to be discharged to:: Skilled nursing facility                                 Additional Comments: wife reports last time he stood was this summer. From SNF - [facility]      Prior Functioning/Environment Level of Independence: Needs assistance  Gait /  Transfers Assistance Needed: Not working with therapy 2/2 c.diff, wife reports bed bound 3+ months ADL's / Homemaking Assistance Needed: Assist for tube feeds and ADLs from staff. Pt self-feeds thickened liquids   Comments: has been at [facility], now a resident, mostly been bound for several months.        OT Problem List: Decreased strength;Decreased range of motion;Decreased activity tolerance;Impaired balance (sitting and/or standing)      OT Treatment/Interventions: Self-care/ADL training;Therapeutic exercise;Energy conservation;DME and/or AE instruction;Therapeutic activities;Patient/family education;Balance training    OT Goals(Current goals can be found in the care plan section) Acute Rehab OT Goals Patient Stated Goal: To return to [facility] OT Goal Formulation: With patient/family Time For Goal Achievement: 08/12/20 Potential to Achieve Goals: Fair ADL Goals Pt Will Perform Grooming: sitting;with mod assist Pt Will Perform Upper Body Dressing: with min assist;sitting Pt Will Transfer to Toilet: with mod assist;with +2 assist (rolling at bed level)  OT Frequency: Min 1X/week    AM-PAC OT "6 Clicks" Daily Activity     Outcome Measure Help from another person eating meals?: A Little Help from another person taking care of personal grooming?: A Little Help from another person toileting, which includes using toliet, bedpan, or urinal?: A Lot Help from another person bathing (including washing, rinsing, drying)?: A Lot Help from another person to put on and taking off regular upper body clothing?: A Little Help from another person to put on and taking off regular lower body clothing?: A Lot 6 Click Score: 15   End of Session Nurse Communication: Mobility status;Other (comment) (requests coffee)  Activity Tolerance: Patient tolerated treatment well Patient left: in bed;with call bell/phone within reach;with family/visitor present  OT Visit Diagnosis: Other abnormalities of gait and  mobility (R26.89)                Time: 1275-1700 OT Time Calculation (min): 15 min Charges:  OT General Charges $OT Visit: 1 Visit OT Evaluation $OT Eval Low Complexity: 1 Low OT Treatments $Self Care/Home Management : 8-22 mins  Dessie Coma, M.S. OTR/L  07/29/20, 1:41 PM  ascom [phone]

## 2020-07-30 LAB — BASIC METABOLIC PANEL
Anion gap: 5 (ref 5–15)
BUN: 23 mg/dL (ref 8–23)
CO2: 27 mmol/L (ref 22–32)
Calcium: 7.7 mg/dL — ABNORMAL LOW (ref 8.9–10.3)
Chloride: 99 mmol/L (ref 98–111)
Creatinine, Ser: 0.7 mg/dL (ref 0.61–1.24)
GFR, Estimated: >60 mL/min (ref >60)
Glucose, Bld: 105 mg/dL — ABNORMAL HIGH (ref 70–99)
Potassium: 4.2 mmol/L (ref 3.5–5.1)
Sodium: 131 mmol/L — ABNORMAL LOW (ref 135–145)

## 2020-07-30 LAB — CBC
HCT: 28.5 % — ABNORMAL LOW (ref 39.0–52.0)
Hemoglobin: 9.3 g/dL — ABNORMAL LOW (ref 13.0–17.0)
MCH: 30.2 pg (ref 26.0–34.0)
MCHC: 32.6 g/dL (ref 30.0–36.0)
MCV: 92.5 fL (ref 80.0–100.0)
Platelets: 453 10*3/uL — ABNORMAL HIGH (ref 150–400)
RBC: 3.08 MIL/uL — ABNORMAL LOW (ref 4.22–5.81)
RDW: 15.9 % — ABNORMAL HIGH (ref 11.5–15.5)
WBC: 8 10*3/uL (ref 4.0–10.5)
nRBC: 0 % (ref 0.0–0.2)

## 2020-07-30 LAB — TYPE AND SCREEN
ABO/RH(D): O POS
Antibody Screen: NEGATIVE
Unit division: 0

## 2020-07-30 LAB — BPAM RBC
Blood Product Expiration Date: 202201182359
ISSUE DATE / TIME: 202112170028
Unit Type and Rh: 5100

## 2020-07-30 LAB — MAGNESIUM: Magnesium: 2.1 mg/dL (ref 1.7–2.4)

## 2020-07-30 LAB — PHOSPHORUS: Phosphorus: 2.3 mg/dL — ABNORMAL LOW (ref 2.5–4.6)

## 2020-07-30 MED ORDER — FREE WATER
30.0000 mL | Freq: Two times a day (BID) | Status: DC
  Administered 2020-07-30 – 2020-08-01 (×4): 30 mL

## 2020-07-30 NOTE — Progress Notes (Signed)
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 H&H are stable. S/p 1 unit of pRBCs transfused. Will continue to monitor   Possible pneumonia: as per CT scan. Will continue on IV ceftriaxone and azithromycin x 5 days total and bronchodilators. Encourage incentive spirometry   Chronic hyponatremia: labile. Will continue to monitor   Severe protein calorie malnutrition & dysphagia: continue w/ tube feeds, started in June 2021  CKDIIIa: Cr is stable.   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 Will continue to monitor     DVT prophylaxis: SCDs Code Status:  Full  Family Communication: discussed pt's care w/ pt's wife, [name], and answered her questions Disposition Plan: PT/OT recs SNF   Status is: Observation  The patient remains OBS appropriate and will d/c before 2 midnights.  Dispo: The patient is from: Home              Anticipated d/c is to: SNF              Anticipated d/c date is: 3 days              Patient currently is not medically stable to d/c.         Consultants:      Procedures:    Antimicrobials:   Subjective: Pt c/o malaise   Objective: Vitals:   07/29/20 1628 07/29/20 2029 07/29/20 2325 07/30/20 0427  BP: 127/74 103/61 (!) 111/59 102/62  Pulse: 73 75 86 78  Resp: 18 18 16 16   Temp: 97.9 F (36.6 C) (!) 96.8 F (36 C) 97.6 F (36.4 C) (!) 97.1 F (36.2 C)  TempSrc:    Axillary  SpO2: 100% 97% (!) 87% 100%  Weight:      Height:        Intake/Output Summary (Last 24 hours) at 07/30/2020 0720 Last  data filed at 07/30/2020 0600 Gross per 24 hour  Intake 1885.67 ml  Output --  Net 1885.67 ml   Filed Weights   07/28/20 1506 07/29/20 1622  Weight: 49.9 kg 37.5 kg    Examination:  General exam: Appears calm & comfortable. Appears older than stated age Respiratory system: clear breath sounds b/l. No wheezes Cardiovascular system: S1/S2+. No clicks or rubs Gastrointestinal system: Abdomen is nondistended, soft and nontender. Normal bowel sounds heard. Central nervous system: Alert and awake Psychiatry: Judgement and insight appear abnormal. Flat mood and affect    Data Reviewed: I have personally reviewed following labs and imaging studies  CBC: Recent Labs  Lab 07/28/20 1510 07/29/20 0432 07/29/20 1830 07/30/20 0526  WBC 12.3* 7.9  --  8.0  HGB 7.2* 9.7* 9.2* 9.3*  HCT 21.5* 29.0* 27.7* 28.5*  MCV 93.5 91.8  --  92.5  PLT 554* 495*  --  967*   Basic Metabolic Panel: Recent Labs  Lab 07/28/20 1510 07/29/20 0432 07/29/20 1830 07/30/20 0526  NA 132* 129* 131* 131*  K 4.6 4.3  --  4.2  CL 97* 96*  --  99  CO2 27 25  --  27  GLUCOSE 94 107*  --  105*  BUN 34* 28*  --  23  CREATININE 0.67 0.69  --  0.70  CALCIUM 8.4* 8.1*  --  7.7*  MG  --   --   --  2.1  PHOS  --   --   --  2.3*   GFR: Estimated Creatinine Clearance: 39.1 mL/min (by C-G formula based on SCr of 0.7 mg/dL). Liver Function Tests: Recent Labs  Lab 07/28/20 1510 07/29/20 0432  AST 26 27  ALT 42 37  ALKPHOS 115 97  BILITOT 0.4 0.6  PROT 5.4* 5.1*  ALBUMIN 2.2* 2.1*   No results for input(s): LIPASE, AMYLASE in the last 168 hours. No results for input(s): AMMONIA in the last 168 hours. Coagulation Profile: Recent Labs  Lab 07/28/20 1510  INR 0.9   Cardiac Enzymes: No results for input(s): CKTOTAL, CKMB, CKMBINDEX, TROPONINI in the last 168 hours. BNP (last 3 results) No results for input(s): PROBNP in the last 8760 hours. HbA1C: No results for input(s): HGBA1C in the last 72  hours. CBG: No results for input(s): GLUCAP in the last 168 hours. Lipid Profile: No results for input(s): CHOL, HDL, LDLCALC, TRIG, CHOLHDL, LDLDIRECT in the last 72 hours. Thyroid Function Tests: No results for input(s): TSH, T4TOTAL, FREET4, T3FREE, THYROIDAB in the last 72 hours. Anemia Panel: Recent Labs    07/28/20 2014  RETICCTPCT 3.7*   Sepsis Labs: No results for input(s): PROCALCITON, LATICACIDVEN in the last 168 hours.  Recent Results (from the past 240 hour(s))  Resp Panel by RT-PCR (Flu A&B, Covid) Nasopharyngeal Swab     Status: None   Collection Time: 07/28/20  8:41 PM   Specimen: Nasopharyngeal Swab; Nasopharyngeal(NP) swabs in vial transport medium  Result Value Ref Range Status   SARS Coronavirus 2 by RT PCR NEGATIVE NEGATIVE Final    Comment: (NOTE) SARS-CoV-2 target nucleic acids are NOT DETECTED.  The SARS-CoV-2 RNA is generally detectable in upper respiratory specimens during the acute phase of infection. The lowest concentration of SARS-CoV-2 viral copies this assay can detect is 138 copies/mL. A negative result does not preclude SARS-Cov-2 infection and should not be used as the sole basis for treatment or other patient management decisions. A negative result may occur with  improper specimen collection/handling, submission of specimen other than nasopharyngeal swab, presence of viral mutation(s) within the areas targeted by this assay, and inadequate number of viral copies(<138 copies/mL). A negative result must be combined with clinical observations, patient history, and epidemiological information. The expected result is Negative.  Fact Sheet for Patients:  EntrepreneurPulse.com.au  Fact Sheet for Healthcare Providers:  IncredibleEmployment.be  This test is no t yet approved or cleared by the [place] FDA and  has been authorized for detection and/or diagnosis of SARS-CoV-2 by FDA under an Emergency Use  Authorization (EUA). This EUA will remain  in effect (meaning this test can be used) for the duration of the COVID-19 declaration under Section 564(b)(1) of the Act, 21 U.S.C.section 360bbb-3(b)(1), unless the authorization is terminated  or revoked sooner.       Influenza A by PCR NEGATIVE NEGATIVE Final   Influenza B by PCR NEGATIVE NEGATIVE Final    Comment: (NOTE) The Xpert Xpress SARS-CoV-2/FLU/RSV plus assay is intended as an aid in the diagnosis of influenza from Nasopharyngeal swab specimens and should not be used as a sole basis for treatment. Nasal washings and aspirates are unacceptable for Xpert Xpress SARS-CoV-2/FLU/RSV testing.  Fact Sheet for Patients: EntrepreneurPulse.com.au  Fact Sheet for Healthcare Providers:  IncredibleEmployment.be  This test is not yet approved or cleared by the [place] and has been authorized for detection and/or diagnosis of SARS-CoV-2 by FDA under an Emergency Use Authorization (EUA). This EUA will remain in effect (meaning this test can be used) for the duration of the COVID-19 declaration under Section 564(b)(1) of the Act, 21 U.S.C. section 360bbb-3(b)(1), unless the authorization is terminated or revoked.  Performed at [facility], [address] [place], [place] [address]          Radiology Studies: CT Angio Abd/Pel W and/or Wo Contrast  Result Date: 07/28/2020 CLINICAL DATA:  Low hemoglobin. Blood transfusions. Dark stool. Vomiting. Dementia. EXAM: CTA ABDOMEN AND PELVIS WITHOUT AND WITH CONTRAST TECHNIQUE: Multidetector CT imaging of the abdomen and pelvis was performed using the standard protocol during bolus administration of intravenous contrast. Multiplanar reconstructed images and MIPs were obtained and reviewed to evaluate the vascular anatomy. CONTRAST:  176mL OMNIPAQUE IOHEXOL 350 MG/ML SOLN COMPARISON:  CT 07/09/2020 FINDINGS: VASCULAR Aorta: Aorta normal  caliber with intimal calcification. Celiac: Celiac trunk is widely patent.  Branch vessels patent. SMA: SMA is widely patent. Branches of the SMA opacified distally. No evidence of contrast extravasation into the bowel. There is significant motion degradation of the imaging. Renals: Bilateral single renal arteries.  Patent IMA: IMA is patent Inflow: Normal Proximal Outflow: Normal Veins: poorly imaged on arterial exam Review of the MIP images confirms the above findings. NON-VASCULAR Lower chest: Bibasilar medial nodule consolidation and bronchiectasis. Hepatobiliary: No focal hepatic lesion.  Gallbladder normal. Pancreas: No pancreatic lesion. Spleen: Spleen is normal Adrenals/Urinary Tract: . Kidneys enhance symmetrically ureters and bladder normal. Stomach/Bowel: Peg tube in the stomach. No bowel obstruction. Colonic diverticulosis without diverticulitis. Lymphatic: No adenopathy Reproductive: Unremarkable Other: No free fluid.  No abscess or inflammation. Musculoskeletal: No aggressive osseous lesion. IMPRESSION: Exam is degraded by patient respiratory motion. VASCULAR 1. No active extravasation of contrast from the mesenteric vasculature to localize gastrointestinal bleeding. 2. No evidence of arterial vascular occlusion. NON-VASCULAR 1. Bibasilar nodular consolidation and bronchiectasis. Differential includes aspiration pneumonitis versus fungal infection or pneumonia. 2. No acute findings in the abdomen pelvis. Electronically Signed   By: Suzy Bouchard M.D.   On: 07/28/2020 19:33        Scheduled Meds: . aspirin  325 mg Oral Daily  . calcium-vitamin D  1 tablet Oral BID  . clopidogrel  75 mg Oral Daily  . donepezil  5 mg Oral Daily  . ferrous sulfate  300 mg Oral Daily  . free water  30 mL Per Tube Q12H  . imatinib  300 mg Oral Daily  . pantoprazole (PROTONIX) IV  40 mg Intravenous Q12H  . traZODone  75 mg Oral QHS   Continuous Infusions: . azithromycin Stopped (07/29/20 1327)  .  cefTRIAXone (ROCEPHIN)  IV Stopped (07/29/20 1231)  . feeding supplement (OSMOLITE 1.5 CAL) 1,000 mL (07/30/20 0118)     LOS: 1 day    Time spent: 31 mins     Wyvonnia Dusky, MD [facility] Pager [phone]  If 7PM-7AM, please contact night-coverage 07/30/2020, 7:20 AM

## 2020-07-31 DIAGNOSIS — J189 Pneumonia, unspecified organism: Secondary | ICD-10-CM

## 2020-07-31 LAB — BASIC METABOLIC PANEL
Anion gap: 6 (ref 5–15)
BUN: 23 mg/dL (ref 8–23)
CO2: 25 mmol/L (ref 22–32)
Calcium: 7.8 mg/dL — ABNORMAL LOW (ref 8.9–10.3)
Chloride: 100 mmol/L (ref 98–111)
Creatinine, Ser: 0.6 mg/dL — ABNORMAL LOW (ref 0.61–1.24)
GFR, Estimated: >60 mL/min (ref >60)
Glucose, Bld: 102 mg/dL — ABNORMAL HIGH (ref 70–99)
Potassium: 4.4 mmol/L (ref 3.5–5.1)
Sodium: 131 mmol/L — ABNORMAL LOW (ref 135–145)

## 2020-07-31 LAB — CBC
HCT: 28.6 % — ABNORMAL LOW (ref 39.0–52.0)
Hemoglobin: 9.5 g/dL — ABNORMAL LOW (ref 13.0–17.0)
MCH: 30.5 pg (ref 26.0–34.0)
MCHC: 33.2 g/dL (ref 30.0–36.0)
MCV: 92 fL (ref 80.0–100.0)
Platelets: 459 10*3/uL — ABNORMAL HIGH (ref 150–400)
RBC: 3.11 MIL/uL — ABNORMAL LOW (ref 4.22–5.81)
RDW: 15.9 % — ABNORMAL HIGH (ref 11.5–15.5)
WBC: 8.2 10*3/uL (ref 4.0–10.5)
nRBC: 0 % (ref 0.0–0.2)

## 2020-07-31 MED ORDER — INFLUENZA VAC A&B SA ADJ QUAD 0.5 ML IM PRSY
0.5000 mL | PREFILLED_SYRINGE | INTRAMUSCULAR | Status: DC
  Filled 2020-07-31: qty 0.5

## 2020-07-31 NOTE — Progress Notes (Signed)
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 S/p 1 unit of pRBCs transfused. H&H are statble   Possible pneumonia: as per CT scan. Continue on IV azithromycin & ceftriaxone x 5 days. Continue on bronchodilators and encourage incentive spirometry   Chronic hyponatremia: stable. 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  Leukocytosis: resolved  Thrombocytosis: etiology unclear. Will continue to monitor     DVT prophylaxis: SCDs Code Status:  Full  Family Communication: discussed pt's care w/ pt's wife, [name], and answered her questions Disposition Plan: will return to SNF   Status is: Observation  The patient remains OBS appropriate and will d/c before 2 midnights.  Dispo: The patient is from: Home              Anticipated d/c is to: SNF              Anticipated d/c date is: 1 day               Patient currently is not medically stable to d/c.         Consultants:      Procedures:    Antimicrobials:   Subjective: Pt c/o fatigue  Objective: Vitals:   07/30/20 1700 07/30/20 2054 07/30/20 2347 07/31/20 0350  BP: 116/72 121/67 126/72 116/69  Pulse: 79 74 76 75  Resp: 16 18 16 18   Temp: 99.6 F (37.6 C) 97.7 F (36.5 C) 98.1 F (36.7 C) (!) 97.4 F (36.3 C)  TempSrc:  Oral  Oral  SpO2: 100% 100% 100% 100%  Weight:      Height:        Intake/Output Summary (Last 24 hours) at 07/31/2020 0713 Last data filed at 07/31/2020  0400 Gross per 24 hour  Intake 1370 ml  Output 2300 ml  Net -930 ml   Filed Weights   07/28/20 1506 07/29/20 1622  Weight: 49.9 kg 37.5 kg    Examination:  General exam: Appears calm & comfortable  Respiratory system: diminished breath sounds b/l. No wheezes Cardiovascular system: S1 & S2 +. No clicks, rubs or gallops  Gastrointestinal system: Abd is soft, non-tender, non-distended & normal bowel sounds  Central nervous system: Alert and awake  Psychiatry: Judgement and insight appear abnormal. Flat mood and affect    Data Reviewed: I have personally reviewed following labs and imaging studies  CBC: Recent Labs  Lab 07/28/20 1510 07/29/20 0432 07/29/20 1830 07/30/20 0526 07/31/20 0421  WBC 12.3* 7.9  --  8.0 8.2  HGB 7.2* 9.7* 9.2* 9.3* 9.5*  HCT 21.5* 29.0* 27.7* 28.5* 28.6*  MCV 93.5 91.8  --  92.5 92.0  PLT 554* 495*  --  453* 427*   Basic Metabolic Panel: Recent Labs  Lab 07/28/20 1510 07/29/20 0432 07/29/20 1830 07/30/20 0526 07/31/20 0421  NA 132* 129* 131* 131* 131*  K 4.6 4.3  --  4.2 4.4  CL 97* 96*  --  99 100  CO2 27 25  --  27 25  GLUCOSE 94 107*  --  105* 102*  BUN 34* 28*  --  23 23  CREATININE 0.67 0.69  --  0.70 0.60*  CALCIUM 8.4* 8.1*  --  7.7* 7.8*  MG  --   --   --  2.1  --   PHOS  --   --   --  2.3*  --    GFR: Estimated Creatinine Clearance: 39.1 mL/min (A) (by C-G formula based on SCr of 0.6 mg/dL (L)). Liver Function Tests: Recent Labs  Lab 07/28/20 1510 07/29/20 0432  AST 26 27  ALT 42 37  ALKPHOS 115 97  BILITOT 0.4 0.6  PROT 5.4* 5.1*  ALBUMIN 2.2* 2.1*   No results for input(s): LIPASE, AMYLASE in the last 168 hours. No results for input(s): AMMONIA in the last 168 hours. Coagulation Profile: Recent Labs  Lab 07/28/20 1510  INR 0.9   Cardiac Enzymes: No results for input(s): CKTOTAL, CKMB, CKMBINDEX, TROPONINI in the last 168 hours. BNP (last 3 results) No results for input(s): PROBNP in the last 8760  hours. HbA1C: No results for input(s): HGBA1C in the last 72 hours. CBG: No results for input(s): GLUCAP in the last 168 hours. Lipid Profile: No results for input(s): CHOL, HDL, LDLCALC, TRIG, CHOLHDL, LDLDIRECT in the last 72 hours. Thyroid Function Tests: No results for input(s): TSH, T4TOTAL, FREET4, T3FREE, THYROIDAB in the last 72 hours. Anemia Panel: Recent Labs    07/28/20 2014  RETICCTPCT 3.7*   Sepsis Labs: No results for input(s): PROCALCITON, LATICACIDVEN in the last 168 hours.  Recent Results (from the past 240 hour(s))  Resp Panel by RT-PCR (Flu A&B, Covid) Nasopharyngeal Swab     Status: None   Collection Time: 07/28/20  8:41 PM   Specimen: Nasopharyngeal Swab; Nasopharyngeal(NP) swabs in vial transport medium  Result Value Ref Range Status   SARS Coronavirus 2 by RT PCR NEGATIVE NEGATIVE Final    Comment: (NOTE) SARS-CoV-2 target nucleic acids are NOT DETECTED.  The SARS-CoV-2 RNA is generally detectable in upper respiratory specimens during the acute phase of infection. The lowest concentration of SARS-CoV-2 viral copies this assay can detect is 138 copies/mL. A negative result does not preclude SARS-Cov-2 infection and should not be used as the sole basis for treatment or other patient management decisions. A negative result may occur with  improper specimen collection/handling, submission of specimen other than nasopharyngeal swab, presence of viral mutation(s) within the areas targeted by this assay, and inadequate number of viral copies(<138 copies/mL). A negative result must be combined with clinical observations, patient history, and epidemiological information. The expected result is Negative.  Fact Sheet for Patients:  EntrepreneurPulse.com.au  Fact Sheet for Healthcare Providers:  IncredibleEmployment.be  This test is no t yet approved or cleared by the [place] FDA and  has been authorized for detection  and/or diagnosis of SARS-CoV-2 by FDA under an Emergency Use Authorization (EUA). This EUA will remain  in effect (meaning this test can be used) for the duration of the COVID-19 declaration under Section 564(b)(1) of the Act, 21 U.S.C.section 360bbb-3(b)(1), unless the authorization is terminated  or revoked sooner.       Influenza A by PCR NEGATIVE NEGATIVE Final   Influenza B by PCR NEGATIVE NEGATIVE Final    Comment: (NOTE) The Xpert Xpress SARS-CoV-2/FLU/RSV plus assay is intended as an aid in the diagnosis of influenza from Nasopharyngeal swab specimens and should not be used as a sole basis for treatment. Nasal washings  and aspirates are unacceptable for Xpert Xpress SARS-CoV-2/FLU/RSV testing.  Fact Sheet for Patients: EntrepreneurPulse.com.au  Fact Sheet for Healthcare Providers: IncredibleEmployment.be  This test is not yet approved or cleared by the [place] FDA and has been authorized for detection and/or diagnosis of SARS-CoV-2 by FDA under an Emergency Use Authorization (EUA). This EUA will remain in effect (meaning this test can be used) for the duration of the COVID-19 declaration under Section 564(b)(1) of the Act, 21 U.S.C. section 360bbb-3(b)(1), unless the authorization is terminated or revoked.  Performed at [facility], [address], [place], [place] [address]          Radiology Studies: No results found.      Scheduled Meds: . aspirin  325 mg Oral Daily  . calcium-vitamin D  1 tablet Oral BID  . clopidogrel  75 mg Oral Daily  . donepezil  5 mg Oral Daily  . ferrous sulfate  300 mg Oral Daily  . free water  30 mL Per Tube Q12H  . imatinib  300 mg Oral Daily  . [START ON 08/01/2020] influenza vaccine adjuvanted  0.5 mL Intramuscular Tomorrow-1000  . pantoprazole (PROTONIX) IV  40 mg Intravenous Q12H  . traZODone  75 mg Oral QHS   Continuous Infusions: . azithromycin 500 mg (07/30/20  1038)  . cefTRIAXone (ROCEPHIN)  IV 1 g (07/30/20 0939)  . feeding supplement (OSMOLITE 1.5 CAL) 1,000 mL (07/30/20 0118)     LOS: 2 days    Time spent: 30 mins     Wyvonnia Dusky, MD [facility] Pager [phone]  If 7PM-7AM, please contact night-coverage 07/31/2020, 7:13 AM

## 2020-08-01 LAB — RESP PANEL BY RT-PCR (FLU A&B, COVID) ARPGX2
Influenza A by PCR: NEGATIVE
Influenza B by PCR: NEGATIVE
SARS Coronavirus 2 by RT PCR: NEGATIVE

## 2020-08-01 LAB — CBC
HCT: 26.4 % — ABNORMAL LOW (ref 39.0–52.0)
Hemoglobin: 8.5 g/dL — ABNORMAL LOW (ref 13.0–17.0)
MCH: 30 pg (ref 26.0–34.0)
MCHC: 32.2 g/dL (ref 30.0–36.0)
MCV: 93.3 fL (ref 80.0–100.0)
Platelets: 455 10*3/uL — ABNORMAL HIGH (ref 150–400)
RBC: 2.83 MIL/uL — ABNORMAL LOW (ref 4.22–5.81)
RDW: 15.9 % — ABNORMAL HIGH (ref 11.5–15.5)
WBC: 8.3 10*3/uL (ref 4.0–10.5)
nRBC: 0 % (ref 0.0–0.2)

## 2020-08-01 LAB — BASIC METABOLIC PANEL
Anion gap: 6 (ref 5–15)
BUN: 27 mg/dL — ABNORMAL HIGH (ref 8–23)
CO2: 26 mmol/L (ref 22–32)
Calcium: 7.8 mg/dL — ABNORMAL LOW (ref 8.9–10.3)
Chloride: 102 mmol/L (ref 98–111)
Creatinine, Ser: 0.77 mg/dL (ref 0.61–1.24)
GFR, Estimated: >60 mL/min (ref >60)
Glucose, Bld: 92 mg/dL (ref 70–99)
Potassium: 4.6 mmol/L (ref 3.5–5.1)
Sodium: 134 mmol/L — ABNORMAL LOW (ref 135–145)

## 2020-08-01 MED ORDER — GERHARDT'S BUTT CREAM
TOPICAL_CREAM | Freq: Two times a day (BID) | CUTANEOUS | Status: DC
  Filled 2020-08-01: qty 1

## 2020-08-01 MED ORDER — AZITHROMYCIN 500 MG PO TABS: 500.0000 mg | ORAL_TABLET | Freq: Every day | ORAL | 0 refills | Status: AC

## 2020-08-01 MED ORDER — BISACODYL 5 MG PO TBEC: 10.0000 mg | DELAYED_RELEASE_TABLET | Freq: Every day | ORAL | Status: DC | PRN

## 2020-08-01 MED ORDER — DOCUSATE SODIUM 100 MG PO CAPS: 200.0000 mg | ORAL_CAPSULE | Freq: Two times a day (BID) | ORAL | Status: DC

## 2020-08-01 MED ORDER — ENOXAPARIN SODIUM 40 MG/0.4ML ~~LOC~~ SOLN
40.0000 mg | SUBCUTANEOUS | Status: DC
  Administered 2020-08-01: 14:00:00 40 mg via SUBCUTANEOUS

## 2020-08-01 NOTE — TOC Transition Note (Signed)
Transition of Care [facility]) - CM/SW Discharge Note   Patient Details  Name: Alex Christensen MRN: [id number] Date of Birth: 08-07-1941  [facility] CM/SW Contact:  Magnus Ivan, LCSW Phone Number: 08/01/2020, 2:12 PM   Clinical Narrative:   Patient to discharge to [facility] today, Room 509 B. Confirmed with [facility] at [facility] CSW updated MD, RN, patient/family. Asked RN to call report and MD to submit DC Summary. Asked for Rapid COVID test prior to DC. Medical Necessity Form and Face Sheet placed in Discharge Packet by patient chart. EMS transport arranged for 5:00 pick up with [facility] to allow time for COVID test to result. No other needs identified prior to discharge.     Final next level of care: Skilled Nursing Facility Barriers to Discharge: Barriers Resolved   Patient Goals and CMS Choice Patient states their goals for this hospitalization and ongoing recovery are:: SNF CMS Medicare.gov Compare Post Acute Care list provided to:: Patient Represenative (must comment) Choice offered to / list presented to : Spouse  Discharge Placement                Patient to be transferred to facility by: [facility] Name of family member notified: [name] (wife) Patient and family notified of of transfer: 08/01/20  Discharge Plan and Services                                     Social Determinants of Health (SDOH) Interventions     Readmission Risk Interventions No flowsheet data found.

## 2020-08-01 NOTE — Consult Note (Signed)
WOC Nurse Consult Note: Patient receiving care in [facility] completed remotely after review of chart. Reason for Consult: MASD sacrum Wound type: MASD/IAD sacrum Pressure Injury POA: NA Measurement: deferred Wound bed: red, bleeding Dressing procedure/placement/frequency: After gently cleaning the scrotum, apply Gerhardt's but cream to the MASD area. Apply PRN.  Monitor the wound area(s) for worsening of condition such as: Signs/symptoms of infection, increase in size, development of or worsening of odor, development of pain, or increased pain at the affected locations.   Notify the medical team if any of these develop.  Thank you for the consult. [facility] nurse will not follow at this time.   Please re-consult the [facility] team if needed.  Cathlean Marseilles Tamala Julian, MSN, RN, [place], Lysle Pearl, [facility] Wound Treatment Associate Pager [phone]

## 2020-08-01 NOTE — Progress Notes (Signed)
Called SNF facility to give report, was on hold for 10 minutes. Called again, and was on hold, was not able to give report

## 2020-08-01 NOTE — Discharge Summary (Signed)
Physician Discharge Summary  Alex Christensen GDJ:[id number] DOB: [birth year] DOA: 07/28/2020  PCP: Alex Pink, MD  Admit date: 07/28/2020 Discharge date: 08/01/2020  Admitted From: SNF Disposition:  SNF  Recommendations for Outpatient Follow-up:  1. Follow up with PCP in 1 week    Home Health: no  Equipment/Devices:  Discharge Condition: stable  CODE STATUS: full  Diet recommendation: clear liquid diet, continue w/ tube feeds   Brief/Interim Summary: HPI was taken from Dr. Jonelle Christensen: Alex Christensen is a 79 y.o. male with medical history significant of CML, essential hypertension, recurrent anemia, chronic kidney disease stage III, TIA, dementia, protein calorie malnutrition who was seen in the hospital on October 28 with significant anemia work-up was done including evaluation for source of his anemia.  The source of his anemia was not established.  Patient still follows up with oncology as well as GI.  He was seen in the office today and blood work was done showing hemoglobin of 6.  His previous hemoglobin was around 9 just back in late October.  He was sent over to the ER for evaluation.  In the ER here hemoglobin was found to be 7.2 but he appears clinically dry.  At this point suspicion is patient may have had symptomatic anemia from hemoglobin less than 7 g event.  He has been weak and debilitated per wife.  No dark stools.  No bright red blood per rectum no hematemesis.  Patient will be admitted for treatment of symptomatic anemia..  ED Course: Temperature 97.6 blood pressure 111/64 pulse 92 respiratory rate 25 oxygen sat 95% room air.  White count 12.3 hemoglobin 7.2 platelets 554 sodium is 132 potassium 4.6 chloride 97 CO2 27 BUN 34 creatinine 0.6, calcium 8.4.  Respiratory panel is negative his reticulocyte count is 3.7 with absolute count of 84.2.  CT angiogram abdomen pelvis with contrast showed no acute findings in the abdomen bite has bibasilar nodular consolidation and  bronchiectasis.  Patient being admitted to the hospital for further evaluation and treatment   Hospital Course from Dr. Jimmye Christensen 12/17-12/20/21: Pt presented w/ symptomatic anemia and received 1 unit of pRBCs. H&H trending up and remained stable. Also, pt was found to have pneumonia and was treated w/ IV azithromycin and ceftriaxone. Pt will have 1 day more of azithromycin po to complete the course. Pt has not required supplemental oxygen while inpatient. PT/OT saw the pt and recommended SNF.   Discharge Diagnoses:  Principal Problem:   Symptomatic anemia Active Problems:   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HCC)   Essential hypertension   CKD (chronic kidney disease), stage IIIa   Hyponatremia   Protein-calorie malnutrition, severe  Symptomatic anemia:likely secondary to CML & CKD. S/p 1 unit of pRBCs transfused. H&H are stable   Possible pneumonia: as per CT scan. Continue on IV azithromycin & ceftriaxone x 5 days. Continue on bronchodilators and encourage incentive spirometry   Chronic hyponatremia:labile. Will continue to monitor   Severe protein calorie malnutrition & dysphagia: continue w/ tube feeds, started in June 2021   CKDIIIa: Cr is stable and better than baseline   CML: will continue on home dose of gleevac.   Leukocytosis: resolved  Thrombocytosis: etiology unclear. Will continue to monitor   Discharge Instructions  Discharge Instructions    Diet clear liquid   Complete by: As directed    Discharge instructions   Complete by: As directed    F/u w/ PCP in 1 week   Increase activity slowly   Complete by: As directed  No wound care   Complete by: As directed      Allergies as of 08/01/2020   No Known Allergies     Medication List    TAKE these medications   aspirin 325 MG EC tablet Take 325 mg by mouth daily.   azithromycin 500 MG tablet Commonly known as: Zithromax Take 1 tablet (500 mg total) by mouth daily for 1 day. Take 1 tablet daily for 3  days.   calcium-vitamin D 500-200 MG-UNIT tablet Commonly known as: OSCAL WITH D Take 1 tablet by mouth 2 (two) times daily.   clopidogrel 75 MG tablet Commonly known as: PLAVIX Take 1 tablet (75 mg total) by mouth daily.   donepezil 5 MG tablet Commonly known as: ARICEPT Take 5 mg by mouth daily.   feeding supplement (OSMOLITE 1.5 CAL) Liqd Place 1,000 mLs into feeding tube continuous. feeding supplement (OSMOLITE 1.5 CAL) liquid 700 mL Dose: 700 mL Freq: Every 24 hours Route: PER TUBE Last Dose: Stopped (03/14/20 0058) Start: 03/11/20 1800 Admin Instructions: Provide Osmolite 1.5 Cal at 50 mL/hr x 14 hrs each evening from 1800-0800   feeding supplement (PRO-STAT SUGAR FREE 64) Liqd Take 30 mLs by mouth in the morning and at bedtime.   ferrous sulfate 300 (60 Fe) MG/5ML syrup Take 300 mg by mouth daily.   guaifenesin 100 MG/5ML syrup Commonly known as: ROBITUSSIN Take 400 mg by mouth in the morning, at noon, in the evening, and at bedtime.   imatinib 100 MG tablet Commonly known as: GLEEVEC Take 300 mg by mouth daily. Dissolve in water or apple juice, administer immediately.   ondansetron 4 MG disintegrating tablet Commonly known as: ZOFRAN-ODT Take 4 mg by mouth every 8 (eight) hours as needed for nausea or vomiting.   traZODone 50 MG tablet Commonly known as: DESYREL Take 75 mg by mouth at bedtime.       No Known Allergies  Consultations:     Procedures/Studies: DG Abdomen 1 View  Result Date: 07/09/2020 CLINICAL DATA:  79 year old male with decreased oral intake. EXAM: ABDOMEN - 1 VIEW COMPARISON:  Chest radiograph from earlier the same day and chest CT from 07/08/2020 FINDINGS: Gaseous distension of the stomach. Otherwise nonobstructive bowel gas pattern. Gastrostomy tube in place in the left upper quadrant. Previously administered, excreted iodinated contrast within the partially distended bladder. Atherosclerotic calcifications are noted. IMPRESSION:  Gaseous distension of the stomach with indwelling gastrostomy tube, otherwise nonobstructive bowel gas pattern. Recommend gastric tube venting. Electronically Signed   By: Alex Cancer MD   On: 07/09/2020 11:20   CT Angio Chest PE W and/or Wo Contrast  Result Date: 07/08/2020 CLINICAL DATA:  Hypoxia.  Possible pneumothorax. EXAM: CT ANGIOGRAPHY CHEST WITH CONTRAST TECHNIQUE: Multidetector CT imaging of the chest was performed using the standard protocol during bolus administration of intravenous contrast. Multiplanar CT image reconstructions and MIPs were obtained to evaluate the vascular anatomy. CONTRAST:  68mL OMNIPAQUE IOHEXOL 350 MG/ML SOLN COMPARISON:  Chest x-ray 07/08/2020 FINDINGS: Cardiovascular: Satisfactory opacification of the pulmonary arteries to the segmental level. No evidence of pulmonary embolism. Thoracic aorta is nonaneurysmal. Scattered atherosclerotic calcification of the aorta. There is coronary artery calcification. Normal heart size. No pericardial effusion. Mediastinum/Nodes: No enlarged mediastinal, hilar, or axillary lymph nodes. Thyroid gland, trachea, and esophagus demonstrate no significant findings. Lungs/Pleura: Lungs are clear. No focal airspace consolidation. No pleural effusion. No pneumothorax. Upper Abdomen: Percutaneous gastrostomy tube is partially seen. 1.5 cm left hepatic lobe cyst. 3.2 cm upper pole left  renal cyst. No acute findings within the visualized upper abdomen. Musculoskeletal: No chest wall abnormality. No acute or significant osseous findings. Review of the MIP images confirms the above findings. IMPRESSION: 1. Negative for pulmonary embolism 2. No acute intrathoracic process.  No pneumothorax. 3. Aortic and coronary artery atherosclerosis.  (ICD10-I70.0) Electronically Signed   By: Davina Poke D.O.   On: 07/08/2020 12:12   CT ABDOMEN PELVIS W CONTRAST  Result Date: 07/09/2020 CLINICAL DATA:  Suspected bowel obstruction. EXAM: CT ABDOMEN AND  PELVIS WITH CONTRAST TECHNIQUE: Multidetector CT imaging of the abdomen and pelvis was performed using the standard protocol following bolus administration of intravenous contrast. CONTRAST:  43mL OMNIPAQUE IOHEXOL 300 MG/ML  SOLN COMPARISON:  None. FINDINGS: Lower chest: Calcific atherosclerotic disease of the coronary arteries and aorta. Normal heart size. Hepatobiliary: 1.5 cm indeterminate hypoattenuated mass in the left lobe of the liver. Normal appearing decompressed gallbladder. Pancreas: Unremarkable. No pancreatic ductal dilatation or surrounding inflammatory changes. Spleen: Normal in size without focal abnormality. Adrenals/Urinary Tract: Normal adrenal glands. 3 cm left renal cyst. No evidence of hydronephrosis. Normal appearance of the urinary bladder. Stomach/Bowel: Gas distended stomach. Gastrostomy tube appears patent with balloon against the anterior wall of the gastric body. No evidence of small-bowel obstruction. Nonspecific mild circumferential thickening of the small bowel loops in the lower abdomen. Diffuse circumferential thickening of the distal transverse, descending colon, sigmoid colon and rectum. Vascular/Lymphatic: Tortuosity and calcific atherosclerotic disease of the aorta. No evidence of lymphadenopathy. Reproductive: Nonenlarged prostate gland containing nonspecific calcifications. Other: No abdominal wall hernia or abnormality. No abdominopelvic ascites. 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 Focally advanced osteoarthritic changes at L2-L3. 9 mm sclerotic lesion within S2, nonspecific. IMPRESSION: 1. Gas distended stomach. Gastrostomy tube appears patent with balloon against the anterior wall of the gastric body. 2. Nonspecific mild circumferential thickening of the small bowel loops in the lower abdomen. No evidence of small-bowel obstruction. 3. Diffuse circumferential  thickening of the distal transverse, descending colon, sigmoid colon and rectum. This may represent colitis of vascular, or infectious/inflammatory etiology. 4. 1.5 cm indeterminate hypoattenuated mass in the left lobe of the liver. 5. 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 6. 9 mm sclerotic lesion within S2, nonspecific. In the absence of malignancy capable of producing sclerotic lesions, this may represent a bone island. 7. Calcific atherosclerotic disease of the coronary arteries and aorta. Aortic Atherosclerosis (ICD10-I70.0). Electronically Signed   By: Fidela Salisbury M.D.   On: 07/09/2020 12:36   DG Chest Portable 1 View  Result Date: 07/09/2020 CLINICAL DATA:  Weakness, borderline oxygen level. EXAM: PORTABLE CHEST 1 VIEW COMPARISON:  Chest radiograph November 26 21. FINDINGS: The heart size and mediastinal contours are within normal limits. Both lungs are clear. No visible pleural effusions or pneumothorax. No acute osseous abnormality. Marked gaseous distension of the stomach, partially imaged. IMPRESSION: 1. No acute cardiopulmonary disease. 2. Marked gaseous distension of the stomach, partially imaged. Electronically Signed   By: Margaretha Sheffield MD   On: 07/09/2020 10:15   DG Chest Portable 1 View  Result Date: 07/08/2020 CLINICAL DATA:  Shortness of breath. EXAM: PORTABLE CHEST 1 VIEW COMPARISON:  06/01/2020. FINDINGS: Mediastinum and hilar structures normal. Heart size normal. Lungs are clear. Findings suggesting small right-sided pneumothorax noted. Follow-up exam suggested to exclude skin fold. No pleural effusion. Mild thoracic spine scoliosis. No acute  bony abnormality. IMPRESSION: Findings suggesting small right-sided pneumothorax noted. Follow-up exam suggested to exclude a skin fold. Critical Value/emergent results were called by telephone at the time of interpretation on 07/08/2020 at 10:23  am to provider [facility] , who verbally acknowledged these results. Electronically Signed   By: Marcello Moores  Register   On: 07/08/2020 10:25   CT Angio Abd/Pel W and/or Wo Contrast  Result Date: 07/28/2020 CLINICAL DATA:  Low hemoglobin. Blood transfusions. Dark stool. Vomiting. Dementia. EXAM: CTA ABDOMEN AND PELVIS WITHOUT AND WITH CONTRAST TECHNIQUE: Multidetector CT imaging of the abdomen and pelvis was performed using the standard protocol during bolus administration of intravenous contrast. Multiplanar reconstructed images and MIPs were obtained and reviewed to evaluate the vascular anatomy. CONTRAST:  133mL OMNIPAQUE IOHEXOL 350 MG/ML SOLN COMPARISON:  CT 07/09/2020 FINDINGS: VASCULAR Aorta: Aorta normal caliber with intimal calcification. Celiac: Celiac trunk is widely patent.  Branch vessels patent. SMA: SMA is widely patent. Branches of the SMA opacified distally. No evidence of contrast extravasation into the bowel. There is significant motion degradation of the imaging. Renals: Bilateral single renal arteries.  Patent IMA: IMA is patent Inflow: Normal Proximal Outflow: Normal Veins: poorly imaged on arterial exam Review of the MIP images confirms the above findings. NON-VASCULAR Lower chest: Bibasilar medial nodule consolidation and bronchiectasis. Hepatobiliary: No focal hepatic lesion.  Gallbladder normal. Pancreas: No pancreatic lesion. Spleen: Spleen is normal Adrenals/Urinary Tract: . Kidneys enhance symmetrically ureters and bladder normal. Stomach/Bowel: Peg tube in the stomach. No bowel obstruction. Colonic diverticulosis without diverticulitis. Lymphatic: No adenopathy Reproductive: Unremarkable Other: No free fluid.  No abscess or inflammation. Musculoskeletal: No aggressive osseous lesion. IMPRESSION: Exam is degraded by patient respiratory motion. VASCULAR 1. No active extravasation of contrast from the mesenteric vasculature to localize gastrointestinal bleeding. 2. No evidence of  arterial vascular occlusion. NON-VASCULAR 1. Bibasilar nodular consolidation and bronchiectasis. Differential includes aspiration pneumonitis versus fungal infection or pneumonia. 2. No acute findings in the abdomen pelvis. Electronically Signed   By: Suzy Bouchard M.D.   On: 07/28/2020 19:33       Subjective: Pt c/o malaise   Discharge Exam: Vitals:   08/01/20 0810 08/01/20 1236  BP: 107/66 109/67  Pulse: 77 75  Resp: 16 16  Temp: 98.4 F (36.9 C) 98.6 F (37 C)  SpO2: 98% 99%   Vitals:   07/31/20 2358 08/01/20 0456 08/01/20 0810 08/01/20 1236  BP: 108/65 110/64 107/66 109/67  Pulse: 96 86 77 75  Resp: 18 16 16 16   Temp: 99 F (37.2 C) 98.5 F (36.9 C) 98.4 F (36.9 C) 98.6 F (37 C)  TempSrc: Axillary Axillary Oral   SpO2: 99% 100% 98% 99%  Weight:      Height:        General exam: Appears calm & comfortable  Respiratory system: decreased breath sounds b/l Cardiovascular system: S1 & S2+. No rubs, clicks or gallops Gastrointestinal system: Abd is soft, non-tender, non-distended & normal bowel sounds  Central nervous system: Alert and awake  Psychiatry: Judgement and insight appear abnormal. Flat mood and affect    The results of significant diagnostics from this hospitalization (including imaging, microbiology, ancillary and laboratory) are listed below for reference.     Microbiology: Recent Results (from the past 240 hour(s))  Resp Panel by RT-PCR (Flu A&B, Covid) Nasopharyngeal Swab     Status: None   Collection Time: 07/28/20  8:41 PM   Specimen: Nasopharyngeal Swab; Nasopharyngeal(NP) swabs in vial transport medium  Result Value Ref Range  Status   SARS Coronavirus 2 by RT PCR NEGATIVE NEGATIVE Final    Comment: (NOTE) SARS-CoV-2 target nucleic acids are NOT DETECTED.  The SARS-CoV-2 RNA is generally detectable in upper respiratory specimens during the acute phase of infection. The lowest concentration of SARS-CoV-2 viral copies this assay can  detect is 138 copies/mL. A negative result does not preclude SARS-Cov-2 infection and should not be used as the sole basis for treatment or other patient management decisions. A negative result may occur with  improper specimen collection/handling, submission of specimen other than nasopharyngeal swab, presence of viral mutation(s) within the areas targeted by this assay, and inadequate number of viral copies(<138 copies/mL). A negative result must be combined with clinical observations, patient history, and epidemiological information. The expected result is Negative.  Fact Sheet for Patients:  EntrepreneurPulse.com.au  Fact Sheet for Healthcare Providers:  IncredibleEmployment.be  This test is no t yet approved or cleared by the [place] FDA and  has been authorized for detection and/or diagnosis of SARS-CoV-2 by FDA under an Emergency Use Authorization (EUA). This EUA will remain  in effect (meaning this test can be used) for the duration of the COVID-19 declaration under Section 564(b)(1) of the Act, 21 U.S.C.section 360bbb-3(b)(1), unless the authorization is terminated  or revoked sooner.       Influenza A by PCR NEGATIVE NEGATIVE Final   Influenza B by PCR NEGATIVE NEGATIVE Final    Comment: (NOTE) The Xpert Xpress SARS-CoV-2/FLU/RSV plus assay is intended as an aid in the diagnosis of influenza from Nasopharyngeal swab specimens and should not be used as a sole basis for treatment. Nasal washings and aspirates are unacceptable for Xpert Xpress SARS-CoV-2/FLU/RSV testing.  Fact Sheet for Patients: EntrepreneurPulse.com.au  Fact Sheet for Healthcare Providers: IncredibleEmployment.be  This test is not yet approved or cleared by the [place] FDA and has been authorized for detection and/or diagnosis of SARS-CoV-2 by FDA under an Emergency Use Authorization (EUA). This EUA will remain in  effect (meaning this test can be used) for the duration of the COVID-19 declaration under Section 564(b)(1) of the Act, 21 U.S.C. section 360bbb-3(b)(1), unless the authorization is terminated or revoked.  Performed at [facility], [address] [place], [place] [address]      Labs: BNP (last 3 results) Recent Labs    07/08/20 1020  BNP 073.7*   Basic Metabolic Panel: Recent Labs  Lab 07/28/20 1510 07/29/20 0432 07/29/20 1830 07/30/20 0526 07/31/20 0421 08/01/20 0309  NA 132* 129* 131* 131* 131* 134*  K 4.6 4.3  --  4.2 4.4 4.6  CL 97* 96*  --  99 100 102  CO2 27 25  --  27 25 26   GLUCOSE 94 107*  --  105* 102* 92  BUN 34* 28*  --  23 23 27*  CREATININE 0.67 0.69  --  0.70 0.60* 0.77  CALCIUM 8.4* 8.1*  --  7.7* 7.8* 7.8*  MG  --   --   --  2.1  --   --   PHOS  --   --   --  2.3*  --   --    Liver Function Tests: Recent Labs  Lab 07/28/20 1510 07/29/20 0432  AST 26 27  ALT 42 37  ALKPHOS 115 97  BILITOT 0.4 0.6  PROT 5.4* 5.1*  ALBUMIN 2.2* 2.1*   No results for input(s): LIPASE, AMYLASE in the last 168 hours. No results for input(s): AMMONIA in the last 168 hours.  CBC: Recent Labs  Lab 07/28/20 1510 07/29/20 0432 07/29/20 1830 07/30/20 0526 07/31/20 0421 08/01/20 0309  WBC 12.3* 7.9  --  8.0 8.2 8.3  HGB 7.2* 9.7* 9.2* 9.3* 9.5* 8.5*  HCT 21.5* 29.0* 27.7* 28.5* 28.6* 26.4*  MCV 93.5 91.8  --  92.5 92.0 93.3  PLT 554* 495*  --  453* 459* 455*   Cardiac Enzymes: No results for input(s): CKTOTAL, CKMB, CKMBINDEX, TROPONINI in the last 168 hours. BNP: Invalid input(s): POCBNP CBG: No results for input(s): GLUCAP in the last 168 hours. D-Dimer No results for input(s): DDIMER in the last 72 hours. Hgb A1c No results for input(s): HGBA1C in the last 72 hours. Lipid Profile No results for input(s): CHOL, HDL, LDLCALC, TRIG, CHOLHDL, LDLDIRECT in the last 72 hours. Thyroid function studies No results for input(s): TSH, T4TOTAL,  T3FREE, THYROIDAB in the last 72 hours.  Invalid input(s): FREET3 Anemia work up No results for input(s): VITAMINB12, FOLATE, FERRITIN, TIBC, IRON, RETICCTPCT in the last 72 hours. Urinalysis    Component Value Date/Time   COLORURINE YELLOW (A) 07/08/2020 1418   APPEARANCEUR CLEAR (A) 07/08/2020 1418   LABSPEC 1.041 (H) 07/08/2020 1418   PHURINE 9.0 (H) 07/08/2020 1418   GLUCOSEU NEGATIVE 07/08/2020 1418   HGBUR NEGATIVE 07/08/2020 1418   [place] 07/08/2020 [place] 07/08/2020 1418   PROTEINUR NEGATIVE 07/08/2020 1418   NITRITE NEGATIVE 07/08/2020 1418   LEUKOCYTESUR NEGATIVE 07/08/2020 1418   Sepsis Labs Invalid input(s): PROCALCITONIN,  WBC,  LACTICIDVEN Microbiology Recent Results (from the past 240 hour(s))  Resp Panel by RT-PCR (Flu A&B, Covid) Nasopharyngeal Swab     Status: None   Collection Time: 07/28/20  8:41 PM   Specimen: Nasopharyngeal Swab; Nasopharyngeal(NP) swabs in vial transport medium  Result Value Ref Range Status   SARS Coronavirus 2 by RT PCR NEGATIVE NEGATIVE Final    Comment: (NOTE) SARS-CoV-2 target nucleic acids are NOT DETECTED.  The SARS-CoV-2 RNA is generally detectable in upper respiratory specimens during the acute phase of infection. The lowest concentration of SARS-CoV-2 viral copies this assay can detect is 138 copies/mL. A negative result does not preclude SARS-Cov-2 infection and should not be used as the sole basis for treatment or other patient management decisions. A negative result may occur with  improper specimen collection/handling, submission of specimen other than nasopharyngeal swab, presence of viral mutation(s) within the areas targeted by this assay, and inadequate number of viral copies(<138 copies/mL). A negative result must be combined with clinical observations, patient history, and epidemiological information. The expected result is Negative.  Fact Sheet for Patients:   EntrepreneurPulse.com.au  Fact Sheet for Healthcare Providers:  IncredibleEmployment.be  This test is no t yet approved or cleared by the [place] FDA and  has been authorized for detection and/or diagnosis of SARS-CoV-2 by FDA under an Emergency Use Authorization (EUA). This EUA will remain  in effect (meaning this test can be used) for the duration of the COVID-19 declaration under Section 564(b)(1) of the Act, 21 U.S.C.section 360bbb-3(b)(1), unless the authorization is terminated  or revoked sooner.       Influenza A by PCR NEGATIVE NEGATIVE Final   Influenza B by PCR NEGATIVE NEGATIVE Final    Comment: (NOTE) The Xpert Xpress SARS-CoV-2/FLU/RSV plus assay is intended as an aid in the diagnosis of influenza from Nasopharyngeal swab specimens and should not be used as a sole basis for treatment. Nasal washings and aspirates are unacceptable for Xpert Xpress SARS-CoV-2/FLU/RSV testing.  Fact Sheet for Patients: EntrepreneurPulse.com.au  Fact Sheet for Healthcare Providers: IncredibleEmployment.be  This test is not yet approved or cleared by the [place] FDA and has been authorized for detection and/or diagnosis of SARS-CoV-2 by FDA under an Emergency Use Authorization (EUA). This EUA will remain in effect (meaning this test can be used) for the duration of the COVID-19 declaration under Section 564(b)(1) of the Act, 21 U.S.C. section 360bbb-3(b)(1), unless the authorization is terminated or revoked.  Performed at [facility], [address] [place], [place] [address]      Time coordinating discharge: Over 30 minutes  SIGNED:   Wyvonnia Dusky, MD  [facility] 08/01/2020, 1:20 PM Pager   If 7PM-7AM, please contact night-coverage

## 2020-08-01 NOTE — Progress Notes (Signed)
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 S/p 1 unit of pRBCs transfused. H&H are statble   Possible pneumonia: as per CT scan. Continue on IV azithromycin & ceftriaxone x 5 days. Continue on bronchodilators and encourage incentive spirometry   Chronic hyponatremia: labile. Will continue to monitor   Severe protein calorie malnutrition & dysphagia: continue w/ tube feeds, started in June 2021   CKDIIIa: Cr is stable and better than baseline   CML: will continue on home dose of gleevac.    Leukocytosis: resolved  Thrombocytosis: etiology unclear. Will continue to monitor     DVT prophylaxis: SCDs Code Status:  Full  Family Communication: Disposition Plan: will return to SNF   Status is: Inpatient  Remains inpatient appropriate because:Unsafe d/c plan   Dispo: The patient is from: SNF              Anticipated d/c is to: Home              Anticipated d/c date is: whenever a SNF bed is available               Patient currently is medically stable to d/c.     Consultants:      Procedures:    Antimicrobials:   Subjective: Pt c/o malaise   Objective: Vitals:   07/31/20 1602 07/31/20 2057 07/31/20 2358 08/01/20 0456  BP: 126/68 109/68 108/65 110/64  Pulse: 100 95 96 86  Resp: 16 18 18 16   Temp: 99.4 F (37.4 C) 100 F (37.8 C) 99 F (37.2 C) 98.5 F (36.9 C)  TempSrc:  Axillary Axillary Axillary  SpO2: 97% 99% 99% 100%  Weight:      Height:        Intake/Output Summary (Last 24 hours) at 08/01/2020 0724 Last data filed at 08/01/2020 0518 Gross per 24 hour  Intake 1558.5 ml  Output  1250 ml  Net 308.5 ml   Filed Weights   07/28/20 1506 07/29/20 1622  Weight: 49.9 kg 37.5 kg    Examination:  General exam: Appears calm & comfortable  Respiratory system: decreased breath sounds b/l Cardiovascular system: S1 & S2+. No rubs, clicks or gallops Gastrointestinal system: Abd is soft, non-tender, non-distended & normal bowel sounds  Central nervous system: Alert and awake  Psychiatry: Judgement and insight appear abnormal. Flat mood and affect    Data Reviewed: I have personally reviewed following labs and imaging studies  CBC: Recent Labs  Lab 07/28/20 1510 07/29/20 0432 07/29/20 1830 07/30/20 0526 07/31/20 0421 08/01/20 0309  WBC 12.3* 7.9  --  8.0 8.2 8.3  HGB 7.2* 9.7* 9.2* 9.3* 9.5* 8.5*  HCT 21.5* 29.0* 27.7* 28.5* 28.6* 26.4*  MCV 93.5 91.8  --  92.5 92.0 93.3  PLT 554* 495*  --  453* 459* 269*   Basic Metabolic Panel: Recent Labs  Lab 07/28/20 1510 07/29/20 0432 07/29/20 1830 07/30/20 0526 07/31/20 0421 08/01/20 0309  NA 132* 129* 131* 131* 131* 134*  K 4.6 4.3  --  4.2 4.4 4.6  CL 97* 96*  --  99 100 102  CO2 27 25  --  27 25  26  GLUCOSE 94 107*  --  105* 102* 92  BUN 34* 28*  --  23 23 27*  CREATININE 0.67 0.69  --  0.70 0.60* 0.77  CALCIUM 8.4* 8.1*  --  7.7* 7.8* 7.8*  MG  --   --   --  2.1  --   --   PHOS  --   --   --  2.3*  --   --    GFR: Estimated Creatinine Clearance: 39.1 mL/min (by C-G formula based on SCr of 0.77 mg/dL). Liver Function Tests: Recent Labs  Lab 07/28/20 1510 07/29/20 0432  AST 26 27  ALT 42 37  ALKPHOS 115 97  BILITOT 0.4 0.6  PROT 5.4* 5.1*  ALBUMIN 2.2* 2.1*   No results for input(s): LIPASE, AMYLASE in the last 168 hours. No results for input(s): AMMONIA in the last 168 hours. Coagulation Profile: Recent Labs  Lab 07/28/20 1510  INR 0.9   Cardiac Enzymes: No results for input(s): CKTOTAL, CKMB, CKMBINDEX, TROPONINI in the last 168 hours. BNP (last 3 results) No results for input(s):  PROBNP in the last 8760 hours. HbA1C: No results for input(s): HGBA1C in the last 72 hours. CBG: No results for input(s): GLUCAP in the last 168 hours. Lipid Profile: No results for input(s): CHOL, HDL, LDLCALC, TRIG, CHOLHDL, LDLDIRECT in the last 72 hours. Thyroid Function Tests: No results for input(s): TSH, T4TOTAL, FREET4, T3FREE, THYROIDAB in the last 72 hours. Anemia Panel: No results for input(s): VITAMINB12, FOLATE, FERRITIN, TIBC, IRON, RETICCTPCT in the last 72 hours. Sepsis Labs: No results for input(s): PROCALCITON, LATICACIDVEN in the last 168 hours.  Recent Results (from the past 240 hour(s))  Resp Panel by RT-PCR (Flu A&B, Covid) Nasopharyngeal Swab     Status: None   Collection Time: 07/28/20  8:41 PM   Specimen: Nasopharyngeal Swab; Nasopharyngeal(NP) swabs in vial transport medium  Result Value Ref Range Status   SARS Coronavirus 2 by RT PCR NEGATIVE NEGATIVE Final    Comment: (NOTE) SARS-CoV-2 target nucleic acids are NOT DETECTED.  The SARS-CoV-2 RNA is generally detectable in upper respiratory specimens during the acute phase of infection. The lowest concentration of SARS-CoV-2 viral copies this assay can detect is 138 copies/mL. A negative result does not preclude SARS-Cov-2 infection and should not be used as the sole basis for treatment or other patient management decisions. A negative result may occur with  improper specimen collection/handling, submission of specimen other than nasopharyngeal swab, presence of viral mutation(s) within the areas targeted by this assay, and inadequate number of viral copies(<138 copies/mL). A negative result must be combined with clinical observations, patient history, and epidemiological information. The expected result is Negative.  Fact Sheet for Patients:  EntrepreneurPulse.com.au  Fact Sheet for Healthcare Providers:  IncredibleEmployment.be  This test is no t yet approved or  cleared by the [place] FDA and  has been authorized for detection and/or diagnosis of SARS-CoV-2 by FDA under an Emergency Use Authorization (EUA). This EUA will remain  in effect (meaning this test can be used) for the duration of the COVID-19 declaration under Section 564(b)(1) of the Act, 21 U.S.C.section 360bbb-3(b)(1), unless the authorization is terminated  or revoked sooner.       Influenza A by PCR NEGATIVE NEGATIVE Final   Influenza B by PCR NEGATIVE NEGATIVE Final    Comment: (NOTE) The Xpert Xpress SARS-CoV-2/FLU/RSV plus assay is intended as an aid in the diagnosis of influenza from Nasopharyngeal swab specimens and should not  be used as a sole basis for treatment. Nasal washings and aspirates are unacceptable for Xpert Xpress SARS-CoV-2/FLU/RSV testing.  Fact Sheet for Patients: EntrepreneurPulse.com.au  Fact Sheet for Healthcare Providers: IncredibleEmployment.be  This test is not yet approved or cleared by the [place] FDA and has been authorized for detection and/or diagnosis of SARS-CoV-2 by FDA under an Emergency Use Authorization (EUA). This EUA will remain in effect (meaning this test can be used) for the duration of the COVID-19 declaration under Section 564(b)(1) of the Act, 21 U.S.C. section 360bbb-3(b)(1), unless the authorization is terminated or revoked.  Performed at [facility], [address], [place], [place] [address]          Radiology Studies: No results found.      Scheduled Meds: . aspirin  325 mg Oral Daily  . calcium-vitamin D  1 tablet Oral BID  . clopidogrel  75 mg Oral Daily  . donepezil  5 mg Oral Daily  . ferrous sulfate  300 mg Oral Daily  . free water  30 mL Per Tube Q12H  . imatinib  300 mg Oral Daily  . influenza vaccine adjuvanted  0.5 mL Intramuscular Tomorrow-1000  . pantoprazole (PROTONIX) IV  40 mg Intravenous Q12H  . traZODone  75 mg Oral QHS    Continuous Infusions: . azithromycin 500 mg (07/31/20 1032)  . cefTRIAXone (ROCEPHIN)  IV 1 g (07/31/20 0902)  . feeding supplement (OSMOLITE 1.5 CAL) 1,000 mL (08/01/20 0315)     LOS: 3 days    Time spent: 31 mins     Wyvonnia Dusky, MD [facility] Pager [phone]  If 7PM-7AM, please contact night-coverage 08/01/2020, 7:24 AM

## 2020-08-01 NOTE — NC FL2 (Signed)
[place] LEVEL OF CARE SCREENING TOOL     IDENTIFICATION  Patient Name: Alex Christensen Birthdate: 06-23-1941 Sex: male Admission Date (Current Location): 07/28/2020  [place] and [place] Number:  Engineering geologist and Address:  [facility], [address], [place], [place] [address]      Provider Number: [id number]  Attending Physician Name and Address:  Wyvonnia Dusky, MD  Relative Name and Phone Number:  frederky Vantol,gloria (Spouse)   [phone] [facility])    Current Level of Care: Hospital Recommended Level of Care: [place] Prior Approval Number:    Date Approved/Denied:   PASRR Number: [id number]  Discharge Plan: SNF    Current Diagnoses: Patient Active Problem List   Diagnosis Date Noted  . Symptomatic anemia 07/28/2020  . GI bleeding 06/01/2020  . Protein-calorie malnutrition, severe 03/12/2020  . Pressure injury of skin 03/11/2020  . C. difficile colitis 03/09/2020  . Sepsis secondary to UTI ([place]) 03/09/2020  . Leucocytosis 03/09/2020  . Essential hypertension 12/23/2019  . Hyperlipemia 12/23/2019  . Anemia 12/23/2019  . CKD (chronic kidney disease), stage IIIa 12/23/2019  . 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 Hyponatremia 12/23/2019  . 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[place]) 04/15/2019    Orientation RESPIRATION BLADDER Height & Weight     Self  Normal External catheter,Incontinent Weight: 82 lb 10.8 oz (37.5 kg) Height:  5\' 5"  (165.1 cm)  BEHAVIORAL SYMPTOMS/MOOD NEUROLOGICAL BOWEL NUTRITION STATUS        Diet (clear liquid)  AMBULATORY STATUS COMMUNICATION OF NEEDS Skin   Limited Assist Verbally Bruising (incontinence associated dermatitis)                       Personal Care Assistance Level of Assistance  Bathing,Feeding,Dressing Bathing Assistance: Maximum assistance Feeding assistance: Limited  assistance Dressing Assistance: Maximum assistance     Functional Limitations Info             SPECIAL CARE FACTORS FREQUENCY                       Contractures      Additional Factors Info  Code Status,Allergies Code Status Info: full Allergies Info: nka           Current Medications (08/01/2020):  This is the current hospital active medication list Current Facility-Administered Medications  Medication Dose Route Frequency Provider Last Rate Last Admin  . acetaminophen (TYLENOL) tablet 650 mg  650 mg Oral Q6H PRN Elwyn Reach, MD       Or  . acetaminophen (TYLENOL) suppository 650 mg  650 mg Rectal Q6H PRN Elwyn Reach, MD      . aspirin EC tablet 325 mg  325 mg Oral Daily Gala Romney L, MD   325 mg at 08/01/20 1054  . azithromycin (ZITHROMAX) 500 mg in sodium chloride 0.9 % 250 mL IVPB  500 mg Intravenous Q24H Wyvonnia Dusky, MD 250 mL/hr at 08/01/20 1109 500 mg at 08/01/20 1109  . bisacodyl (DULCOLAX) EC tablet 10 mg  10 mg Oral Daily PRN Wyvonnia Dusky, MD      . calcium-vitamin D (OSCAL WITH D) 500-200 MG-UNIT per tablet 1 tablet  1 tablet Oral BID Elwyn Reach, MD   1 tablet at 08/01/20 1054  . cefTRIAXone (ROCEPHIN) 1 g in sodium chloride 0.9 % 100 mL IVPB  1 g Intravenous  Q24H Wyvonnia Dusky, MD 200 mL/hr at 07/31/20 0902 1 g at 07/31/20 0902  . clopidogrel (PLAVIX) tablet 75 mg  75 mg Oral Daily Elwyn Reach, MD   75 mg at 08/01/20 1054  . docusate sodium (COLACE) capsule 200 mg  200 mg Oral BID Wyvonnia Dusky, MD      . donepezil (ARICEPT) tablet 5 mg  5 mg Oral Daily Elwyn Reach, MD   5 mg at 08/01/20 1055  . enoxaparin (LOVENOX) injection 40 mg  40 mg Subcutaneous Q24H Eppie Gibson M, MD      . feeding supplement (OSMOLITE 1.5 CAL) liquid 1,000 mL  1,000 mL Per Tube Continuous Wyvonnia Dusky, MD 45 mL/hr at 08/01/20 0315 1,000 mL at 08/01/20 0315  . ferrous sulfate 220 (44 Fe) MG/5ML solution 300  mg  300 mg Oral Daily Gala Romney L, MD   300 mg at 08/01/20 1054  . free water 30 mL  30 mL Per Tube Q12H Wyvonnia Dusky, MD   30 mL at 07/31/20 2206  . Gerhardt's butt cream   Topical BID Wyvonnia Dusky, MD      . guaiFENesin (ROBITUSSIN) 100 MG/5ML solution 400 mg  400 mg Oral Q6H PRN Gala Romney L, MD      . influenza vaccine adjuvanted (FLUAD) injection 0.5 mL  0.5 mL Intramuscular Tomorrow-1000 Wyvonnia Dusky, MD      . ipratropium-albuterol (DUONEB) 0.5-2.5 (3) MG/3ML nebulizer solution 3 mL  3 mL Nebulization Q6H PRN Wyvonnia Dusky, MD      . ondansetron [facility]) tablet 4 mg  4 mg Oral Q6H PRN Elwyn Reach, MD       Or  . ondansetron (ZOFRAN) injection 4 mg  4 mg Intravenous Q6H PRN Jonelle Sidle, Mohammad L, MD      . ondansetron (ZOFRAN-ODT) disintegrating tablet 4 mg  4 mg Oral Q8H PRN Jonelle Sidle, Mohammad L, MD      . pantoprazole (PROTONIX) injection 40 mg  40 mg Intravenous Q12H Gala Romney L, MD   40 mg at 08/01/20 1055  . traZODone (DESYREL) tablet 75 mg  75 mg Oral QHS Elwyn Reach, MD   75 mg at 07/31/20 2205     Discharge Medications: Please see discharge summary for a list of discharge medications.  Relevant Imaging Results:  Relevant Lab Results:   Additional Information SS# [id number]  Paden Kuras E Ercelle Winkles, LCSW

## 2020-08-01 NOTE — TOC Initial Note (Addendum)
Transition of Care [facility]) - Initial/Assessment Note    Patient Details  Name: Alex Christensen MRN: [id number] Date of Birth: 02-28-1941  Transition of Care [facility] CM/SW Contact:    Magnus Ivan, LCSW Phone Number: 08/01/2020, 1:04 PM  Clinical Narrative:               Per chart review, patient is disoriented. CSW spoke with patient's wife for Readmission Screening via phone. She reported patient is a long term resident at [facility] in [place] and her plan is for him to return there at time of discharge. She reported patient would need EMS transport as she is physically unable to transport him back to [facility].   CSW checked with MD who reported patient is medically ready for DC today. CSW called and spoke with Tammy at [facility]. She reported she will check with their Business Office to confirm if patient can return there today. CSW asked MD for rapid COVID test.  1:10- Per Tammy, patient can discharge back to [facility] today. Updated RN, MD, and patient's wife. Will work on DC.   Expected Discharge Plan: Skilled Nursing Facility Barriers to Discharge: Continued Medical Work up   Patient Goals and CMS Choice Patient states their goals for this hospitalization and ongoing recovery are:: SNF CMS Medicare.gov Compare Post Acute Care list provided to:: Patient Represenative (must comment) Choice offered to / list presented to : Spouse  Expected Discharge Plan and Services Expected Discharge Plan: [place] arrangements for the past 2 months: [place]                                      Prior Living Arrangements/Services Living arrangements for the past 2 months: [place] Lives with:: Facility Resident Patient language and need for interpreter reviewed:: Yes Do you feel safe going back to the place where you live?: Yes      Need for Family Participation in Patient Care: Yes (Comment) Care giver support system in place?:  Yes (comment)   Criminal Activity/Legal Involvement Pertinent to Current Situation/Hospitalization: No - Comment as needed  Activities of Daily Living      Permission Sought/Granted Permission sought to share information with : Customer service manager (by spouse) Permission granted to share information with : Yes, Verbal Permission Granted     Permission granted to share info w AGENCY: [facility]        Emotional Assessment       Orientation: : Fluctuating Orientation (Suspected and/or reported [place]owners) Alcohol / Substance Use: Not Applicable Psych Involvement: No (comment)  Admission diagnosis:  Symptomatic anemia [D64.9] Anemia, unspecified type [D64.9] Patient Active Problem List   Diagnosis Date Noted  . Symptomatic anemia 07/28/2020  . GI bleeding 06/01/2020  . Protein-calorie malnutrition, severe 03/12/2020  . Pressure injury of skin 03/11/2020  . C. difficile colitis 03/09/2020  . Sepsis secondary to UTI ([place]) 03/09/2020  . Leucocytosis 03/09/2020  . Essential hypertension 12/23/2019  . Hyperlipemia 12/23/2019  . Anemia 12/23/2019  . CKD (chronic kidney disease), stage IIIa 12/23/2019  . 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 Hyponatremia 12/23/2019  . 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 [place] [address] Phone: [phone] Fax: [phone]     Social Determinants of Health (SDOH) Interventions    Readmission Risk Interventions No flowsheet data found.

## 2020-08-01 NOTE — Care Management Important Message (Signed)
Important Message  Patient Details  Name: Alex Christensen MRN: [id number] Date of Birth: 10-Feb-1941   Medicare Important Message Given:  Yes     [name] 08/01/2020, 11:21 AM

## 2020-08-17 ENCOUNTER — Non-Acute Institutional Stay: Payer: Medicare Other | Admitting: Primary Care

## 2020-08-17 ENCOUNTER — Other Ambulatory Visit: Payer: Self-pay

## 2020-09-13 DIAGNOSIS — R4189 Other symptoms and signs involving cognitive functions and awareness: Secondary | ICD-10-CM | POA: Insufficient documentation

## 2020-09-21 ENCOUNTER — Non-Acute Institutional Stay: Payer: Medicare Other | Admitting: Primary Care

## 2020-09-21 ENCOUNTER — Other Ambulatory Visit: Payer: Self-pay

## 2020-09-23 ENCOUNTER — Other Ambulatory Visit: Payer: Self-pay

## 2020-09-23 ENCOUNTER — Non-Acute Institutional Stay: Payer: Medicare Other | Admitting: Primary Care

## 2020-09-27 ENCOUNTER — Other Ambulatory Visit: Payer: Self-pay

## 2020-09-27 ENCOUNTER — Non-Acute Institutional Stay: Payer: Medicare Other | Admitting: Primary Care

## 2020-09-27 DIAGNOSIS — C9210 Chronic myeloid leukemia, BCR/ABL-positive, not having achieved remission: Secondary | ICD-10-CM

## 2020-09-27 DIAGNOSIS — Z515 Encounter for palliative care: Secondary | ICD-10-CM

## 2020-09-27 DIAGNOSIS — E43 Unspecified severe protein-calorie malnutrition: Secondary | ICD-10-CM

## 2020-09-27 NOTE — Progress Notes (Signed)
[facility] Consult Note Telephone: [phone]  Fax: [phone]    Date of encounter: 09/27/20 PATIENT NAME: Alex Christensen [address] [place] [address] [phone] (home)  DOB: [birth year] MRN: [id number]  PRIMARY CARE PROVIDER:    Maryland Pink, MD,  [place] [address] [phone]  REFERRING PROVIDER:   Maryland Pink, [facility] [place],  [place] [address] [phone]  RESPONSIBLE PARTY:   Extended Emergency Contact Information Primary Emergency Contact: Alex Christensen [place] Mobile Phone: [phone] Relation: Spouse Secondary Emergency Contact: Alex Christensen Mobile Phone: [phone] Relation: Daughter  I met face to face with patient in the facility. Palliative Care was asked to follow this patient by consultation request of Rica Koyanagi, MD  to help address advance care planning and goals of care. This is a follow up  visit.   ASSESSMENT AND RECOMMENDATIONS:   1. Advance Care Planning/Goals of Care: Goals include to maximize quality of life and symptom management. Patient has full code directives.  2. Symptom Management:   I met with Alex Christensen today in his nursing home room. He was alert and oriented times one. He was awake and interactive answering questions appropriately. He stated he ate a little by mouth but mostly by tube feedings,  which were being administered. He denies pain or other discomforts, despite being bedbound. I've made a phone call to [name] to discuss his case. No answer message left for follow up if desired.   3. Follow up Palliative Care Visit: Palliative care will continue to follow for goals of care clarification and symptom management. Return 6 weeks or prn.  4. Family /Caregiver/Community Supports: Wife is POA, lives in [place]  5. Cognitive / Functional decline: A and O x 1, dependent in all adls and  iadls.  I spent 15 minutes providing this consultation,  from 1215 to 1230. More than 50% of the time in this consultation was spent in counseling and care coordination.  CODE STATUS:FULL  PPS: 30%  HOSPICE ELIGIBILITY/DIAGNOSIS: TBD  Subjective:  CHIEF COMPLAINT: debility  HISTORY OF PRESENT ILLNESS:  Alex Christensen is a 80 y.o. year old male  with debility, dementia, protein calorie malnutrition on tube feedings .  We are asked to consult around advance care planning and complex medical decision making.    History obtained from review of EMR, discussion with primary team, and  interview with family, caregiver  and/or Alex Christensen. Records reviewed and summarized above.   CURRENT PROBLEM LIST:  Patient Active Problem List   Diagnosis Date Noted  . Symptomatic anemia 07/28/2020  . GI bleeding 06/01/2020  . Protein-calorie malnutrition, severe 03/12/2020  . Pressure injury of skin 03/11/2020  . C. difficile colitis 03/09/2020  . Sepsis secondary to UTI ([place]) 03/09/2020  . Leucocytosis 03/09/2020  . Essential hypertension 12/23/2019  . Hyperlipemia 12/23/2019  . Anemia 12/23/2019  . CKD (chronic kidney disease), stage IIIa 12/23/2019  . 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 Hyponatremia 12/23/2019  . 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 Essential hypertension 12/23/2019  . Hyperlipemia 12/23/2019  . Anemia 12/23/2019  . CKD (chronic kidney disease), stage IIIa 12/23/2019  . Acoustic  neuritis affecting right ear 12/23/2019  . Hyponatremia 12/23/2019  . C. difficile colitis 03/09/2020  . Sepsis secondary to UTI ([place]) 03/09/2020  . Leucocytosis 03/09/2020  . Pressure injury of skin 03/11/2020  .  Protein-calorie malnutrition, severe 03/12/2020  . GI bleeding 06/01/2020  . Symptomatic anemia 07/28/2020   Resolved Ambulatory Problems    Diagnosis Date Noted  . No Resolved Ambulatory Problems   Past Medical History:  Diagnosis Date  . High blood pressure   . Hypernatremia   . Iron deficiency   . Leukemia, chronic myeloid ([place])    SOCIAL HX:  Social History   Tobacco Use  . Smoking status: Former Research scientist (life sciences)  . Smokeless tobacco: Never Used  Substance Use Topics  . Alcohol use: Not Currently   FAMILY HX: No family history on file.    ALLERGIES: No Known Allergies   PERTINENT MEDICATIONS:  Outpatient Encounter Medications as of 09/27/2020  Medication Sig  . Amino Acids-Protein Hydrolys (FEEDING SUPPLEMENT, PRO-STAT SUGAR FREE 64,) LIQD Take 30 mLs by mouth in the morning and at bedtime.  Marland Kitchen aspirin 325 MG EC tablet Take 325 mg by mouth daily.  . calcium-vitamin D (OSCAL WITH D) 500-200 MG-UNIT tablet Take 1 tablet by mouth 2 (two) times daily.  . clopidogrel (PLAVIX) 75 MG tablet Take 1 tablet (75 mg total) by mouth daily.  Marland Kitchen donepezil (ARICEPT) 5 MG tablet Take 5 mg by mouth daily.  . ferrous sulfate 300 (60 Fe) MG/5ML syrup Take 300 mg by mouth daily.  Marland Kitchen imatinib (GLEEVEC) 100 MG tablet Take 300 mg by mouth daily. Dissolve in water or apple juice, administer immediately.  . Nutritional Supplements (FEEDING SUPPLEMENT, OSMOLITE 1.5 CAL,) LIQD Place 1,000 mLs into feeding tube continuous. feeding supplement (OSMOLITE 1.5 CAL) liquid 700 mL Dose: 700 mL Freq: Every 24 hours Route: PER TUBE Last Dose: Stopped (03/14/20 0058) Start: 03/11/20 1800 Admin Instructions: Provide Osmolite 1.5 Cal at 50 mL/hr x 14 hrs each evening from 1800-0800  . ondansetron (ZOFRAN-ODT) 4 MG disintegrating tablet Take 4 mg by mouth every 8 (eight) hours as needed for nausea or vomiting.  . traZODone (DESYREL) 50 MG tablet Take 75 mg by mouth at bedtime.   No facility-administered encounter medications  on file as of 09/27/2020.    Objective: ROS/staff  General: NAD ENMT: endorses dysphagia Cardiovascular: denies chest pain Pulmonary: denies  cough, denies increased SOB Abdomen: endorses fair appetite, denies constipation, endorses incontinence of bowel GU: denies dysuria, endorses incontinence of urine MSK:  endorses ROM limitations, no falls reported Skin: denies rashes or wounds Neurological: endorses weakness, denies pain, denies insomnia Psych: Endorses positive mood Heme/lymph/immuno: denies bruises, abnormal bleeding  Physical Exam: Current and past weights:stable  Constitutional:  NAD General: frail appearing, thin EYES: anicteric sclera, lids intact, no discharge  ENMT: intact hearing,oral mucous membranes dry, dentition intact CV: S1S2, RRR, no LE edema Pulmonary: LCTA, no increased work of breathing, no cough, no audible wheezes, room air Abdomen: intake 25% po, on tube feeds, normo-active BS +  4 quadrants, soft and non tender, no ascites GU: deferred MSK: severe sarcopenia, decreased ROM in all extremities, no contractures of LE, non ambulatory Skin: warm and dry, no rashes or wounds on visible skin Neuro: Generalized weakness, ++cognitive impairment Psych: non-anxious affect, A and O x 1 Hem/lymph/immuno: no widespread bruising   Thank you for the opportunity to participate in the care of Alex Christensen.  The palliative care team will continue to follow. Please call our office at  [phone] if we can be of additional assistance.  Jason Coop, NP , DNP, MPH, AGPCNP-BC, ACHPN   COVID-19 PATIENT SCREENING TOOL  Person answering questions: _______staff____________   1.  Is the patient or any family member in the home showing any signs or symptoms regarding respiratory infection?                  Person with Symptom  ______________na___________ a. Fever/chills/headache                                                        Yes___ No__X_             b. Shortness of breath                                                            Yes___ No__X_           c. Cough/congestion                                               Yes___  No__X_          d. Muscle/Body aches/pains                                                   Yes___ No__X_         e. Gastrointestinal symptoms (diarrhea,nausea)             Yes___ No__X_         f. Sudden loss of smell or taste      Yes___ No__X_        2. Within the past 10 days, has anyone living in the home had any contact with someone with or under investigation for COVID-19?    Yes___ No__X__   Person __________________

## 2020-11-09 ENCOUNTER — Non-Acute Institutional Stay: Payer: Medicare Other | Admitting: Primary Care

## 2020-11-09 ENCOUNTER — Other Ambulatory Visit: Payer: Self-pay

## 2020-11-09 DIAGNOSIS — C9210 Chronic myeloid leukemia, BCR/ABL-positive, not having achieved remission: Secondary | ICD-10-CM

## 2020-11-09 DIAGNOSIS — N183 Chronic kidney disease, stage 3 unspecified: Secondary | ICD-10-CM

## 2020-11-09 DIAGNOSIS — Z515 Encounter for palliative care: Secondary | ICD-10-CM

## 2020-11-09 DIAGNOSIS — E43 Unspecified severe protein-calorie malnutrition: Secondary | ICD-10-CM

## 2020-11-09 NOTE — Progress Notes (Signed)
[facility] Consult Note Telephone: [phone]  Fax: [phone]    Date of encounter: 11/09/20 PATIENT NAME: Alex Christensen [address] [place] [address]   [phone] (home)  DOB: June 08, 1941 MRN: [id number] PRIMARY CARE PROVIDER:    Rica Koyanagi, MD,  [address] [place] [address] [phone]  REFERRING PROVIDER:   Rica Koyanagi, MD [address] [place],  [place] [address] [phone]  RESPONSIBLE PARTY:    Contact Information    Name Relation Home Work Mobile   Alex Christensen,Alex Christensen   [phone]   Alex Christensen Daughter   [phone]   Alex Christensen, Alex Christensen Daughter   [phone]      I met face to face with patient and family in facility. Palliative Care was asked to follow this patient by consultation request of  Rica Koyanagi, MD to address advance care planning and complex medical decision making. This is the follow up visit.     ASSESSMENT AND RECOMMENDATIONS:   1. Advance Care Planning/Goals of Care: Goals include to maximize quality of life and symptom management. Our advance care planning conversation included a discussion about:     The value and importance of advance care planning   Exploration of personal, cultural or spiritual beliefs that might influence medical decisions   Exploration of goals of care in the event of a sudden injury or illness   Review of an advance directive document .   I met with patient in his nursing home room. His wife was present and we discussed his disposition. Wife has asked social worker in the facility to arrange for home health. Upon more discussions she states that she has decided she would like to take him home and let him be in his own environment. She however does not think she can do this until June. She has voiced this desire in the past and did not feel she can provide this continual amount of care. She thinks she can now.   We again  reviewed what caregiving at home would look like.Patient is bedbound and would need a hospital bed and [facility] lift for position changing for transfer out of bed to chair. She stated that his primary provider has not seen him for over a year. I encouraged her to reach out to the primary provider to see if that person could continue to provide services for the Homebound patient.  He receives  total nutritional needs over the night per tube feeds. We discussed that he may eat more if he were to go home and be able to have pleasure diet. We discussed that perhaps she could stop the overnight feeds and just do some bolus feeds in addition to PO intake.Patient does eat a little PO but is on a pure diet, which he dislikes. Wife stated he did eat some birthday cake this past weekend.    Currently he has no skin breakdown. We discuss the scope of home health and hospice. Home health is for rehabilitation and hospice is for end of life care. He would be appropriate For home health on the transition to his home. They would be able to teach the wife modalities in their home and provide information on transfer techniques and taking care of the bedbound patient.   We discussed the concept of supportive comfort care and de-escalation of medical care. He apparently is still on the [place] even though oncology recommended to DC. We discussed her hiring help to be in the  home help with his care and for relief of caregiver strain. She will call  and find out from insurance company if she hast to use a specific home health agency. After most of this discussion she then stated that she did not think she would do this for 2 to 3 more months. I encouraged her that she could transition Reasonably within a week but she states she needs more time.    3. Follow up Palliative Care Visit: Palliative care will continue to follow for goals of care clarification and symptom management. Return 1-2 weeks or prn.   I spent 50 minutes  providing this consultation,  from 1400 to 1550.  More than 50% of the time in this consultation was spent in counseling and care coordination.   CODE STATUS: FULL  PPS: 30%  HOSPICE ELIGIBILITY/DIAGNOSIS: TBD  CHIEF COMPLAINT: frailty  HISTORY OF PRESENT ILLNESS:  Alex Christensen is a 80 y.o. year old male  with CLL in remission, protein calorie malnutrition,  Dysphagia, frailty. Lives in nursing home, currently supported with tube feedings. .   History obtained from review of EMR, discussion with primary team, and  interview with family, caregiver  and/or Alex Christensen. Records reviewed and summarized above.   Thank you for the opportunity to participate in the care of Alex Christensen.  The palliative care team will continue to follow. Please call our office at [phone] if we can be of additional assistance.   Jason Coop, NP , DNP, MPH, AGPCNP-BC, ACHPN  COVID-19 PATIENT SCREENING TOOL Asked and negative response unless otherwise noted:   Have you had symptoms of covid, tested positive or been in contact with someone with symptoms/positive test in the past 5-10 days?

## 2020-11-16 ENCOUNTER — Non-Acute Institutional Stay: Payer: Medicare Other | Admitting: Primary Care

## 2020-11-16 ENCOUNTER — Other Ambulatory Visit: Payer: Self-pay

## 2020-11-16 DIAGNOSIS — T17928S Food in respiratory tract, part unspecified causing other injury, sequela: Secondary | ICD-10-CM

## 2020-11-16 DIAGNOSIS — T17928A Food in respiratory tract, part unspecified causing other injury, initial encounter: Secondary | ICD-10-CM | POA: Insufficient documentation

## 2020-11-16 DIAGNOSIS — Z515 Encounter for palliative care: Secondary | ICD-10-CM

## 2020-11-16 DIAGNOSIS — W44F3XS Food entering into or through a natural orifice, sequela: Secondary | ICD-10-CM

## 2020-11-16 DIAGNOSIS — E43 Unspecified severe protein-calorie malnutrition: Secondary | ICD-10-CM

## 2020-11-16 DIAGNOSIS — R54 Age-related physical debility: Secondary | ICD-10-CM | POA: Insufficient documentation

## 2020-11-16 DIAGNOSIS — Z7409 Other reduced mobility: Secondary | ICD-10-CM | POA: Insufficient documentation

## 2020-11-16 DIAGNOSIS — W44F3XA Food entering into or through a natural orifice, initial encounter: Secondary | ICD-10-CM | POA: Insufficient documentation

## 2020-11-16 DIAGNOSIS — R4189 Other symptoms and signs involving cognitive functions and awareness: Secondary | ICD-10-CM

## 2020-11-16 NOTE — Progress Notes (Signed)
[facility] Consult Note Telephone: [phone]  Fax: [phone]    Date of encounter: 11/16/20 PATIENT NAME: Alex Christensen [address] [place] [address]   [phone] (home)  DOB: [birth year] MRN: [id number] PRIMARY CARE PROVIDER:    Rica Koyanagi, MD,  [address]  [place] [address] [phone]  REFERRING PROVIDER:   Rica Koyanagi, MD [address] [place],  [place] [address] [phone]  RESPONSIBLE PARTY:    Contact Information    Name Relation Home Work Mobile   frederky Leon,gloria Spouse   [phone]   Rosann Auerbach Daughter   [phone]   Mehdi, Gironda Daughter   [phone]      I met face to face with patient in facility. Palliative Care was asked to follow this patient by consultation request of  Rica Koyanagi, MD to address advance care planning and complex medical decision making. This is the follow up visit.   ASSESSMENT AND RECOMMENDATIONS:   1. Advance Care Planning/Goals of Care: Goals include to maximize quality of life and symptom management.  Appt also to meet with wife to review ACP  but she has left for the day.  2. Symptom Management:   Assessing for needs for oxygen. Has not had bouts of dyspnea or distress. PO2=89 today but no concordant symptoms noted. He also has anemia which may impact the reading.  Has essentially stopped using oxygen per [place] for several weeks. Wife requests d/c since it is still being billed. I will send order to d/c oxygen continuous at this time. It can be resumed as needed.  3. Follow up Palliative Care Visit: Palliative care will continue to follow for advance care planning and  clarification and symptom management. Return 4 weeks or prn.  4. Family /Caregiver/Community Supports: Wife is POA, lives in [place]  5. Cognitive / Functional decline: A and O x 1, severe cognitive impairment, dependent in all adls and iadls.  I spent 25 minutes providing  this consultation. More than 50% of the time in this consultation was spent in counseling and care coordination.  CODE STATUS: FULL  PPS: 30%  HOSPICE ELIGIBILITY/DIAGNOSIS: TBD  Medical reason for visit: assess for dyspnea  HISTORY OF PRESENT ILLNESS:  Alex Christensen is a 80 y.o. year old male  with frailty, anemia, h/o dyspnea.  Wife has asked for oxygen use to be assessed. He is currently without labor of breathing, and has not presented with any dyspnea for some time. His pulse ox is 89% but he is historically anemic from underlying CLL process, and his fingers are cold.  History obtained from review of EMR, discussion with primary team, and  interview with family, caregiver  and/or [name]. Records reviewed and summarized above.   CURRENT PROBLEM LIST:  Patient Active Problem List   Diagnosis Date Noted  . Symptomatic anemia 07/28/2020  . GI bleeding 06/01/2020  . Protein-calorie malnutrition, severe 03/12/2020  . Pressure injury of skin 03/11/2020  . C. difficile colitis 03/09/2020  . Sepsis secondary to UTI ([facility]) 03/09/2020  . Leucocytosis 03/09/2020  . Essential hypertension 12/23/2019  . Hyperlipemia 12/23/2019  . Anemia 12/23/2019  . CKD (chronic kidney disease), stage IIIa 12/23/2019  . 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 Hyponatremia 12/23/2019  . 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[facility]) 04/15/2019   PAST MEDICAL HISTORY:  Active Ambulatory Problems    Diagnosis Date Noted  . 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  [facility]) 04/15/2019  . 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 Essential hypertension 12/23/2019  . Hyperlipemia 12/23/2019  . Anemia 12/23/2019  . CKD (chronic kidney disease), stage IIIa 12/23/2019  . 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 Hyponatremia 12/23/2019  . C. difficile colitis 03/09/2020  . Sepsis secondary to UTI ([facility]) 03/09/2020  . Leucocytosis  03/09/2020  . Pressure injury of skin 03/11/2020  . Protein-calorie malnutrition, severe 03/12/2020  . GI bleeding 06/01/2020  . Symptomatic anemia 07/28/2020   Resolved Ambulatory Problems    Diagnosis Date Noted  . No Resolved Ambulatory Problems   Past Medical History:  Diagnosis Date  . High blood pressure   . Hypernatremia   . Iron deficiency   . Leukemia, chronic myeloid ([place])    SOCIAL HX:  Social History   Tobacco Use  . Smoking status: Former Research scientist (life sciences)  . Smokeless tobacco: Never Used  Substance Use Topics  . Alcohol use: Not Currently   FAMILY HX: No further family history attainable from patient or on chart review; no family present.   ALLERGIES: No Known Allergies   PERTINENT MEDICATIONS:  Outpatient Encounter Medications as of 11/16/2020  Medication Sig  . Amino Acids-Protein Hydrolys (FEEDING SUPPLEMENT, PRO-STAT SUGAR FREE 64,) LIQD Take 30 mLs by mouth in the morning and at bedtime.  Marland Kitchen aspirin 325 MG EC tablet Take 325 mg by mouth daily.  . calcium-vitamin D (OSCAL WITH D) 500-200 MG-UNIT tablet Take 1 tablet by mouth 2 (two) times daily.  . clopidogrel (PLAVIX) 75 MG tablet Take 1 tablet (75 mg total) by mouth daily.  Marland Kitchen donepezil (ARICEPT) 5 MG tablet Take 5 mg by mouth daily.  . ferrous sulfate 300 (60 Fe) MG/5ML syrup Take 300 mg by mouth daily.  Marland Kitchen imatinib (GLEEVEC) 100 MG tablet Take 300 mg by mouth daily. Dissolve in water or apple juice, administer immediately.  . Nutritional Supplements (FEEDING SUPPLEMENT, OSMOLITE 1.5 CAL,) LIQD Place 1,000 mLs into feeding tube continuous. feeding supplement (OSMOLITE 1.5 CAL) liquid 700 mL Dose: 700 mL Freq: Every 24 hours Route: PER TUBE Last Dose: Stopped (03/14/20 0058) Start: 03/11/20 1800 Admin Instructions: Provide Osmolite 1.5 Cal at 50 mL/hr x 14 hrs each evening from 1800-0800  . ondansetron (ZOFRAN-ODT) 4 MG disintegrating tablet Take 4 mg by mouth every 8 (eight) hours as needed for nausea or vomiting.  .  traZODone (DESYREL) 50 MG tablet Take 75 mg by mouth at bedtime.   No facility-administered encounter medications on file as of 11/16/2020.   ROS  General: NAD EYES: denies vision changes ENMT: denies dysphagia Cardiovascular: denies chest pain, denies DOE Pulmonary: denies cough, denies increased SOB Neurological: denies pain, denies insomnia Heme/lymph/immuno: denies bruises, abnormal bleeding  Physical Exam: Current and past weights: stable Constitutional:NAD General: frail appearing, thin ENMT: intact hearing, oral mucous membranes moist, dentition intact CV:  no LE edema Pulmonary: no increased work of breathing, no cough, room air, Po2= 89% Abdomen: NPO except pleasure diet, has tube feeds,no ascites MSK: severe sarcopenia, bedbound Neuro:  + generalized weakness,  severe cognitive impairment Psych: non-anxious affect, A and O x 1  Thank you for the opportunity to participate in the care of Mr. Kluender.  The palliative care team will continue to follow. Please call our office at [phone] if we can be of additional assistance.   Jason Coop, NP , DNP, MPH, AGPCNP-BC, ACHPN    COVID-19 PATIENT SCREENING TOOL Asked and negative response unless otherwise noted:  Have you had symptoms of covid, tested positive or been in contact with someone with symptoms/positive test in the past 5-10 days?

## 2020-12-09 ENCOUNTER — Other Ambulatory Visit: Payer: Self-pay

## 2020-12-09 ENCOUNTER — Non-Acute Institutional Stay: Payer: Medicare Other | Admitting: Primary Care

## 2020-12-12 ENCOUNTER — Other Ambulatory Visit: Payer: Self-pay

## 2020-12-12 ENCOUNTER — Non-Acute Institutional Stay: Payer: Medicare Other | Admitting: Primary Care

## 2020-12-12 DIAGNOSIS — Z515 Encounter for palliative care: Secondary | ICD-10-CM

## 2020-12-12 DIAGNOSIS — E43 Unspecified severe protein-calorie malnutrition: Secondary | ICD-10-CM

## 2020-12-12 DIAGNOSIS — R54 Age-related physical debility: Secondary | ICD-10-CM

## 2020-12-12 NOTE — Progress Notes (Signed)
[facility] Consult Note Telephone: [phone]  Fax: [phone]    Date of encounter: 12/12/20 PATIENT NAME: Alex Christensen [address] [place] [address]   [phone] (home)  DOB: April 21, 1941 MRN: [id number] PRIMARY CARE PROVIDER:    Rica Koyanagi, MD,  [place] [address] [phone]  REFERRING PROVIDER:   Rica Koyanagi, MD [address] [place],  [place] [address] [phone]  RESPONSIBLE PARTY:    Contact Information    Name Relation Home Work Mobile   Alex Christensen,Alex Christensen   [phone]   Alex Christensen Daughter   [phone]   Alex Christensen Daughter   [phone]       I met face to face with patient and family (wife) in [facility]. Palliative Care was asked to follow this patient by consultation request of  Alex Koyanagi, MD to address advance care planning and complex medical decision making. This is a follow up visit.                                                               ASSESSMENT AND PLAN / RECOMMENDATIONS:   Advance Care Planning/Goals of Care: Goals include to maximize quality of life and symptom management. Our advance care planning conversation included a discussion about:     The value and importance of advance care planning   Exploration of personal, cultural or spiritual beliefs that might influence medical decisions   Exploration of goals of care in the event of a sudden injury or illness   Review  of an  advance directive document .  CODE STATUS: FULL CODE   I met with patient and wife in his nursing home room. We continue to discuss care at home. We discussed the services offered by home health and hospice and concordant goals of care with both. Wife is interested in continuing tube  feedings. We discussed bolus feedings which might be more convenient. I referred her to [place] case Freight forwarder  and [facility]  to identify a  home health agency that could  help them make a transition. If he stays with the [facility] he would need an infusion company but this could likely be handled through home health.   I directed her to discharge planning from the skilled  facility and choosing a date to target for. For instance he would need to have  an admission date for home care  to be scheduled. Palliative can continue to see in-home. Wife declines a bed in the home but has a recliner. She also declines a [employer] fast streamer. Patient is not able to weight bear so I would recommend a Hoyer lift. I also recommended that SNF facility staff work with her on transfers prior to his leaving.    We have discussed discharge home in the past several times. Wife has been reticent in the past due to need for in home support services. We continue to discuss their desires and in-home needs. Patient will remain on tube feeds. He may be able to take po at home if feedings turn to bolus. Currently he doesn't have much appetite. He did fail some swallow tests in the past but that could be revisited with home health. He is eager to get home to  his surroundings and especially his pet dog.  Follow up Palliative Care Visit: Palliative care will continue to follow for complex medical decision making, advance care planning, and clarification of goals. Return 4 weeks or prn.  I spent 25 minutes providing this consultation. More than 50% of the time in this consultation was spent in counseling and care coordination.  PPS: 30%  HOSPICE ELIGIBILITY/DIAGNOSIS: TBD  Chief Complaint: debility  HISTORY OF PRESENT ILLNESS:  Alex Christensen is a 80 y.o. year old male  with dementia, debility, dysphagia. Weight loss has ceased with tube feedings. .   History obtained from review of EMR, discussion with primary team, and interview with family, facility staff/caregiver and/or Alex Christensen.  I reviewed available labs, medications, imaging, studies and related documents from the EMR.  Records reviewed and  summarized above.  ROS   General: NAD Pulmonary: denies cough, denies increased SOB Abdomen: denies constipation, endorses incontinence of bowel GU: denies dysuria, endorses incontinence of urine MSK:  endorses weakness,  no falls reported Skin: denies rashes or wounds Neurological: denies pain, denies insomnia Psych: Endorses positive mood Heme/lymph/immuno: denies bruises, abnormal bleeding  Physical Exam: Current and past weights:stable around 96-98 lbs Constitutional: NAD General: frail appearing, thin EYES: anicteric sclera, lids intact, no discharge  ENMT: intact hearing, oral mucous membranes moist, dentition intact CV: no LE edema Pulmonary: no increased work of breathing, no cough, room air Abdomen: intake 100% tube feeds,, no ascites MSK: severe  sarcopenia, moves all extremities, non ambulatory Skin: warm and dry, no rashes or wounds on visible skin Neuro:  Profound  generalized weakness,  Advanced cognitive impairment Psych: non-anxious affect, A and O x 1 Hem/lymph/immuno: no widespread bruising   Thank you for the opportunity to participate in the care of Alex Christensen.  The palliative care team will continue to follow. Please call our office at [phone] if we can be of additional assistance.   Jason Coop, NP , DNP, MPH, AGPCNP-BC, ACHPN  COVID-19 PATIENT SCREENING TOOL Asked and negative response unless otherwise noted:   Have you had symptoms of covid, tested positive or been in contact with someone with symptoms/positive test in the past 5-10 days?

## 2021-01-06 ENCOUNTER — Telehealth: Payer: Self-pay | Admitting: Primary Care

## 2021-01-06 NOTE — Telephone Encounter (Signed)
Spoke with wife [name] evening of 01/05/21, who called regarding discharge home plans. Patient will be moving from [facility] where he's been for a year back home to live with his wife and hired caregivers. [facility] has [facility] for [facility] feeds as well as home health. She also requested that palliative NP follow in the home. I've asked our office to reach out to primary care provider for consultation request. I've also asked her to talk with nursing home NP for any prescription she may need. I have invited her to call me during the transition time and if needed we can do a telemedicine visit.

## 2021-01-10 ENCOUNTER — Telehealth: Payer: Self-pay | Admitting: Nurse Practitioner

## 2021-01-10 NOTE — Telephone Encounter (Signed)
Spoke with patient's wife, [name], to offer to schedule a Palliative f/u visit (post discharge from [facility]) and I asked her if patient was supposed to be discharged home today from [facility] and she said yes, they picked him up around 11 and he should be getting home any minute.  Wife has requested that I call her back later today and asked if I could call back around 2 and she said that would be fine.  Will f/u with her at that time to offer to schedule visit

## 2021-01-10 NOTE — Telephone Encounter (Signed)
Called wife back and spoke with her regarding scheduling a Palliative f/u visit in the home (post discharged from [facility]) and she was in agreement with this.  I have scheduled a Telehealth f/u visit on 01/18/21 @ 9:30 AM

## 2021-01-18 ENCOUNTER — Encounter: Payer: Self-pay | Admitting: Nurse Practitioner

## 2021-01-18 ENCOUNTER — Telehealth: Payer: Self-pay | Admitting: Primary Care

## 2021-01-18 ENCOUNTER — Other Ambulatory Visit: Payer: Self-pay

## 2021-01-18 ENCOUNTER — Other Ambulatory Visit: Payer: Medicare Other | Admitting: Nurse Practitioner

## 2021-01-18 DIAGNOSIS — E43 Unspecified severe protein-calorie malnutrition: Secondary | ICD-10-CM

## 2021-01-18 DIAGNOSIS — Z515 Encounter for palliative care: Secondary | ICD-10-CM

## 2021-01-18 NOTE — Progress Notes (Signed)
[facility] Consult Note Telephone: [phone]  Fax: [phone]    Date of encounter: 01/18/21 PATIENT NAME: Alex Christensen [address] [place] [address]   [phone] (home)  DOB: [birth year] MRN: [id number] PRIMARY CARE PROVIDER:    Rica Koyanagi, MD,  [place] [address] [phone]  RESPONSIBLE PARTY:    Contact Information    Name Relation Home Work Mobile   Alex Christensen,Alex Christensen   [phone]   Alex Christensen Daughter   [phone]   Alex Christensen, Alex Christensen Daughter   [phone]     Due to the COVID-19 crisis, this visit was done via telemedicine from my office and it was initiated and consent by this patient and or family.  I connected with  Alex Christensen on 01/18/21 by a video enabled telemedicine application and verified that I am speaking with the correct person.   I discussed the limitations of evaluation and management by telemedicine. The patient expressed understanding and agreed to proceed.Palliative Care was asked to follow this patient by consultation request of  Alex Koyanagi, MD to address advance care planning and complex medical decision making. This is a follow up visit.  ASSESSMENT AND PLAN / RECOMMENDATIONS:   Symptom Management/Plan: 1. ACP: full code currently documented, was previously DNR, will clarify with f/u discussion. Discussed Hospice services through Medicare benefit. Alex. Sheller in agreement to have Hospice Physician to review case for eligibility.   2. Protein calorie malnutrition feeding comfort foods with intermit bolus through g-tube though focus is on feeding orally. Weights/measurement  3. Goals of Care: Goals include to maximize quality of life and symptom management. Our advance care planning conversation included a discussion about:     The value and importance of advance care planning   Exploration of personal, cultural or spiritual beliefs that  might influence medical decisions   Exploration of goals of care in the event of a sudden injury or illness   Identification and preparation of a healthcare agent   Review and updating or creation of an advance directive document.  4. Palliative care encounter; Palliative care encounter; Palliative medicine team will continue to support patient, patient's family, and medical team. Visit consisted of counseling and education dealing with the complex and emotionally intense issues of symptom management and palliative care in the setting of serious and potentially life-threatening illness .  Follow up Palliative Care Visit: Palliative care will continue to follow for complex medical decision making, advance care planning, and clarification of goals.  I spent 40  minutes providing this consultation. More than 50% of the time in this consultation was spent in counseling and care coordination.  PPS: 30%  HOSPICE ELIGIBILITY/DIAGNOSIS: TBD  Chief Complaint: Follow up Palliative consult for complex medical decision making  HISTORY OF PRESENT ILLNESS:  Arrick Dutton is a 80 y.o. year old male  with multiple medical problems including CML, TIA left brain s/p TPA, Dementia, HTN, chronic kidney disease stage , protein calorie malnutrition, dysphagia, g-tube, anemia, acoustic neuroma, hyperlipidemia, anemia. Hospitalized 5/6 / 2021 to 5 / 12 / 2021 TIA left brain s/p TPA. Hospitalized 07/28/2020 to 08/01/2020 for hemoglobin 6, weak, debilitated, treated for symptomatic anemia requiring transfusion, also noted to have PNA. I called Alex Christensen, Alex Christensen wife for telemedicine telephonic is video not available follow up palliative care visit. Alex Christensen and I talked about purpose of palliative care visit, in agreement. We talked about how Alex Ambrosino has been doing since he got  home. Alex Christensen endorses they have been doing fairly well together. Alex Christensen endorses they recently stopped chemotherapy oral agent.  Alex Christensen endorses Alex Christensen has been eating regular food and they have been decreasing the tube feedings. Alex Christensen endorses Alex Sehgal has not had trouble with choking or coughing during eating. Alex Christensen endorses she has to cut with the food such as meet up in smaller pieces. Alex Christensen and I talked about the time he was in nursing facility and the decisions came about to bring him home. Alex Christensen endorses she has no in home health it is just her. We talked About Hospice benefit under Medicare program. We talked about what Hospice services would be provided. We talked about eligibility criteria and now that he is not currently taking in oral chemo with the possibility of eligibility. Alex Christensen endorses she is in agreement to have Hospice physicians review case. We talked about when Alex Christensen was at [facility] where this provider did see for palliative care. We talked about role of palliative care and plan of care period therapeutic listening, emotional support provided. We talked about will contact Alex Brensinger once it is determined how space eligibility and then can either proceed with Hospice or schedule palliative care follow up visit. Alex Paff in agreement. Questions answered  We talked about symptoms currently he is comfortable.   06/2020 76 lbs with wt loss at 35% in 2-3 months  History obtained from review of EMR, discussion with Alex. [place] I reviewed available labs, medications, imaging, studies and related documents from the EMR.  Records reviewed and summarized above.   ROS Full 14 system review of systems performed and negative with exception of: as per HPI.   Physical Exam: Deferred  Questions and concerns were addressed. The wife was encouraged to call with questions and/or concerns. My contact information was provided. Provided general support and encouragement, no other unmet needs identified  Thank you for the opportunity to participate in the care of Alex. Alvira.  The palliative  care team will continue to follow. Please call our office at [phone] if we can be of additional assistance.   This chart was dictated using voice recognition software. Despite best efforts to proofread, errors can occur which can change the documentation meaning.   Irene Collings Ihor Gully, NP , MSN,, [facility]

## 2021-01-19 NOTE — Telephone Encounter (Signed)
Opened in error

## 2021-01-20 ENCOUNTER — Telehealth: Payer: Self-pay | Admitting: Nurse Practitioner

## 2021-01-20 NOTE — Telephone Encounter (Signed)
I called Ms. Alex Christensen, updated on PC discussion with Hospice eligibility. Discussed Dr Gilford Rile in agreement to Hospice eligibility. Ms. Alex Christensen and I talked more about goc. We talked about aggressive vs conservative vs comfort care. We talked about Hospice services, what is provided. We talked about [name] current clinical conditions and reasons he meets eligibility. We talked about Hospice philosophy. We talked about scenarios of care. We talked about role PC in poc. Ms. Alex Christensen endorses she was not ready for Hospice at this time. We talked about scheduling f/u PC face to face visit in 2 weeks or sooner if declines.   Total time spent 20 minutes Documentation 5 minutes Phone discussion 15 minutes

## 2021-02-04 ENCOUNTER — Emergency Department: Payer: Medicare Other

## 2021-02-04 ENCOUNTER — Emergency Department
Admission: EM | Admit: 2021-02-04 | Discharge: 2021-02-04 | Disposition: A | Discharge status: Home / Self Care | Payer: Medicare Other | Attending: Emergency Medicine | Admitting: Emergency Medicine

## 2021-02-04 DIAGNOSIS — N3000 Acute cystitis without hematuria: Secondary | ICD-10-CM | POA: Insufficient documentation

## 2021-02-04 DIAGNOSIS — Z7982 Long term (current) use of aspirin: Secondary | ICD-10-CM | POA: Diagnosis not present

## 2021-02-04 DIAGNOSIS — N1831 Chronic kidney disease, stage 3a: Secondary | ICD-10-CM | POA: Insufficient documentation

## 2021-02-04 DIAGNOSIS — Z87891 Personal history of nicotine dependence: Secondary | ICD-10-CM | POA: Insufficient documentation

## 2021-02-04 DIAGNOSIS — M549 Dorsalgia, unspecified: Secondary | ICD-10-CM

## 2021-02-04 DIAGNOSIS — I129 Hypertensive chronic kidney disease with stage 1 through stage 4 chronic kidney disease, or unspecified chronic kidney disease: Secondary | ICD-10-CM | POA: Insufficient documentation

## 2021-02-04 DIAGNOSIS — F0390 Unspecified dementia without behavioral disturbance: Secondary | ICD-10-CM | POA: Diagnosis not present

## 2021-02-04 DIAGNOSIS — R778 Other specified abnormalities of plasma proteins: Secondary | ICD-10-CM | POA: Insufficient documentation

## 2021-02-04 DIAGNOSIS — Z79899 Other long term (current) drug therapy: Secondary | ICD-10-CM | POA: Diagnosis not present

## 2021-02-04 LAB — URINALYSIS, COMPLETE (UACMP) WITH MICROSCOPIC
Bilirubin Urine: NEGATIVE
Glucose, UA: NEGATIVE mg/dL
Hgb urine dipstick: NEGATIVE
Ketones, ur: NEGATIVE mg/dL
Nitrite: POSITIVE — AB
Protein, ur: NEGATIVE mg/dL
Specific Gravity, Urine: 1.008 (ref 1.005–1.030)
Squamous Epithelial / HPF: NONE SEEN (ref 0–5)
pH: 7 (ref 5.0–8.0)

## 2021-02-04 LAB — CBC WITH DIFFERENTIAL/PLATELET
Abs Immature Granulocytes: 0.11 10*3/uL — ABNORMAL HIGH (ref 0.00–0.07)
Basophils Absolute: 0.1 10*3/uL (ref 0.0–0.1)
Basophils Relative: 0 %
Eosinophils Absolute: 0.5 10*3/uL (ref 0.0–0.5)
Eosinophils Relative: 4 %
HCT: 31.6 % — ABNORMAL LOW (ref 39.0–52.0)
Hemoglobin: 10.9 g/dL — ABNORMAL LOW (ref 13.0–17.0)
Immature Granulocytes: 1 %
Lymphocytes Relative: 7 %
Lymphs Abs: 0.8 10*3/uL (ref 0.7–4.0)
MCH: 32.7 pg (ref 26.0–34.0)
MCHC: 34.5 g/dL (ref 30.0–36.0)
MCV: 94.9 fL (ref 80.0–100.0)
Monocytes Absolute: 1 10*3/uL (ref 0.1–1.0)
Monocytes Relative: 8 %
Neutro Abs: 10.1 10*3/uL — ABNORMAL HIGH (ref 1.7–7.7)
Neutrophils Relative %: 80 %
Platelets: 382 10*3/uL (ref 150–400)
RBC: 3.33 MIL/uL — ABNORMAL LOW (ref 4.22–5.81)
RDW: 14 % (ref 11.5–15.5)
WBC: 12.6 10*3/uL — ABNORMAL HIGH (ref 4.0–10.5)
nRBC: 0 % (ref 0.0–0.2)

## 2021-02-04 LAB — TROPONIN I (HIGH SENSITIVITY)
Troponin I (High Sensitivity): 119 ng/L (ref <18)
Troponin I (High Sensitivity): 112 ng/L (ref <18)

## 2021-02-04 LAB — MAGNESIUM: Magnesium: 2.2 mg/dL (ref 1.7–2.4)

## 2021-02-04 LAB — COMPREHENSIVE METABOLIC PANEL
ALT: 17 U/L (ref 0–44)
AST: 31 U/L (ref 15–41)
Albumin: 3.8 g/dL (ref 3.5–5.0)
Alkaline Phosphatase: 71 U/L (ref 38–126)
Anion gap: 11 (ref 5–15)
BUN: 30 mg/dL — ABNORMAL HIGH (ref 8–23)
CO2: 27 mmol/L (ref 22–32)
Calcium: 9.9 mg/dL (ref 8.9–10.3)
Chloride: 95 mmol/L — ABNORMAL LOW (ref 98–111)
Creatinine, Ser: 1.35 mg/dL — ABNORMAL HIGH (ref 0.61–1.24)
GFR, Estimated: 53 mL/min — ABNORMAL LOW (ref >60)
Glucose, Bld: 149 mg/dL — ABNORMAL HIGH (ref 70–99)
Potassium: 4.5 mmol/L (ref 3.5–5.1)
Sodium: 133 mmol/L — ABNORMAL LOW (ref 135–145)
Total Bilirubin: 0.6 mg/dL (ref 0.3–1.2)
Total Protein: 7.2 g/dL (ref 6.5–8.1)

## 2021-02-04 MED ORDER — CEPHALEXIN 500 MG PO CAPS: 500.0000 mg | ORAL_CAPSULE | Freq: Four times a day (QID) | ORAL | 0 refills | Status: DC

## 2021-02-04 MED ORDER — CEPHALEXIN 500 MG PO CAPS: 500.0000 mg | ORAL_CAPSULE | Freq: Four times a day (QID) | ORAL | 0 refills | Status: AC

## 2021-02-04 MED ORDER — ACETAMINOPHEN 500 MG PO TABS
1000.0000 mg | ORAL_TABLET | Freq: Once | ORAL | Status: AC
  Administered 2021-02-04: 1000 mg via ORAL
  Filled 2021-02-04: qty 2

## 2021-02-04 MED ORDER — MORPHINE SULFATE (PF) 2 MG/ML IV SOLN
2.0000 mg | Freq: Once | INTRAVENOUS | Status: AC
  Administered 2021-02-04: 2 mg via INTRAVENOUS
  Filled 2021-02-04: qty 1

## 2021-02-04 MED ORDER — SODIUM CHLORIDE 0.9 % IV SOLN
1.0000 g | Freq: Once | INTRAVENOUS | Status: AC
  Administered 2021-02-04: 1 g via INTRAVENOUS

## 2021-02-04 MED ORDER — LIDOCAINE 5 % EX PTCH
1.0000 | MEDICATED_PATCH | CUTANEOUS | Status: DC
  Administered 2021-02-04: 1 via TRANSDERMAL
  Filled 2021-02-04: qty 1

## 2021-02-04 NOTE — ED Notes (Signed)
Called [facility] for transport  to [place], [place]

## 2021-02-04 NOTE — ED Notes (Signed)
Called [facility] for transport  to [address] [place], [place] [address]

## 2021-02-04 NOTE — ED Provider Notes (Signed)
[facility] Emergency Department Provider Note  ____________________________________________   Event Date/Time   First MD Initiated Contact with Patient 02/04/21 1337     (approximate)  I have reviewed the triage vital signs and the nursing notes.   HISTORY  Chief Complaint Back Pain (10/10 back pain per ems tacky 143)   HPI Alex Christensen is a 80 y.o. male with multiple medical problems including CML, TIA left brain s/p TPA, dementia, HTN, chronic kidney disease stage , protein calorie malnutrition, dysphagia, g-tube, anemia, acoustic neuroma, hyperlipidemia,  and chronic back pain related to.  Extensive degenerative changes and some anterolisthesis on L5 over S1 who presents via EMS from home where she is complaining of some acute on chronic back pain to his wife and home health nurse to care for patient.  Patient is nonambulatory at baseline.  He denies any other sick symptoms.  He however he is not oriented.  EMS is no additional history.  Attempted to reach patient's wife and daughter without success x2.  No additional history is available on presentation for         Past Medical History:  Diagnosis Date   High blood pressure    Hypernatremia    Iron deficiency    Leukemia, chronic myeloid (HCC)    TIA (transient ischemic attack)     Patient Active Problem List   Diagnosis Date Noted   Aspiration of food 11/16/2020   Immobility 11/16/2020   Frailty syndrome in geriatric patient 11/16/2020   Impairment of cognitive function 09/13/2020   Symptomatic anemia 07/28/2020   GI bleeding 06/01/2020   Protein-calorie malnutrition, severe 03/12/2020   Pressure injury of skin 03/11/2020   C. difficile colitis 03/09/2020   Sepsis secondary to UTI ([place]) 03/09/2020   Leucocytosis 03/09/2020   Essential hypertension 12/23/2019   Hyperlipemia 12/23/2019   Anemia 12/23/2019   CKD (chronic kidney disease), stage IIIa 12/23/2019   Acoustic neuritis  affecting right ear 12/23/2019   Hyponatremia 12/23/2019   TIA (transient ischemic attack) L brain s/p tPA 12/17/2019   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[place]) 04/15/2019    Past Surgical History:  Procedure Laterality Date   TOOTH EXTRACTION      Prior to Admission medications   Medication Sig Start Date End Date Taking? Authorizing Provider  Amino Acids-Protein Hydrolys (FEEDING SUPPLEMENT, PRO-STAT SUGAR FREE 64,) LIQD Take 30 mLs by mouth in the morning and at bedtime.    [provider]  aspirin 325 MG EC tablet Take 325 mg by mouth daily.    [provider]  calcium-vitamin D (OSCAL WITH D) 500-200 MG-UNIT tablet Take 1 tablet by mouth 2 (two) times daily.    [provider]  clopidogrel (PLAVIX) 75 MG tablet Take 1 tablet (75 mg total) by mouth daily. 12/24/19   Donzetta Starch, NP  donepezil (ARICEPT) 5 MG tablet Take 5 mg by mouth daily.    [provider]  Ferrous Sulfate 220 (44 Fe) MG/5ML SOLN Place 220 mg into feeding tube daily.    [provider]  imatinib (GLEEVEC) 100 MG tablet Take 300 mg by mouth daily. Dissolve in water or apple juice, administer immediately.    [provider]  Nutritional Supplements (FEEDING SUPPLEMENT, OSMOLITE 1.5 CAL,) LIQD Place 1,000 mLs into feeding tube continuous. feeding supplement (OSMOLITE 1.5 CAL) liquid 700 mL Dose: 700 mL Freq: Every 24 hours Route: PER TUBE Last Dose: Stopped (03/14/20 0058) Start: 03/11/20 1800 Admin Instructions: Provide Osmolite 1.5 Cal at  50 mL/hr x 14 hrs each evening from 1800-0800 03/15/20   Ralene Muskrat B, MD  ondansetron (ZOFRAN-ODT) 4 MG disintegrating tablet Take 4 mg by mouth every 8 (eight) hours as needed for nausea or vomiting.    [provider]  traZODone (DESYREL) 50 MG tablet Take 75 mg by mouth at bedtime.    [provider]    Allergies Patient has no known allergies.  History reviewed. No pertinent family history.  Social  History Social History   Tobacco Use   Smoking status: Former    Pack years: 0.00   Smokeless tobacco: Never  Substance Use Topics   Alcohol use: Not Currently    Review of Systems  Review of Systems  Unable to perform ROS: Dementia     ____________________________________________   PHYSICAL EXAM:  VITAL SIGNS: ED Triage Vitals  Enc Vitals Group     BP 02/04/21 1336 (!) 132/92     Pulse Rate 02/04/21 1336 100     Resp 02/04/21 1336 17     Temp 02/04/21 1336 98.5 F (36.9 C)     Temp Source 02/04/21 1336 Oral     SpO2 02/04/21 1336 98 %     Weight --      Height --      Head Circumference --      Peak Flow --      Pain Score 02/04/21 1337 10     Pain Loc --      Pain Edu? --      Excl. in [place]? --    Vitals:   02/04/21 1336  BP: (!) 132/92  Pulse: 100  Resp: 17  Temp: 98.5 F (36.9 C)  SpO2: 98%   Physical Exam Vitals and nursing note reviewed.  Constitutional:      Appearance: He is well-developed.  HENT:     Head: Normocephalic and atraumatic.     Right Ear: External ear normal.     Left Ear: External ear normal.     Nose: Nose normal.  Eyes:     Conjunctiva/sclera: Conjunctivae normal.  Cardiovascular:     Rate and Rhythm: Normal rate and regular rhythm.     Heart sounds: No murmur heard. Pulmonary:     Effort: Pulmonary effort is normal. No respiratory distress.     Breath sounds: Normal breath sounds.  Abdominal:     Palpations: Abdomen is soft.     Tenderness: There is no abdominal tenderness.  Musculoskeletal:     Cervical back: Neck supple.  Skin:    General: Skin is warm and dry.  Neurological:     Mental Status: He is alert. Mental status is at baseline. He is disoriented and confused.     Comments: Patient has symmetric grip strength in his follow-up extremities.  Able move his toes on command bilaterally.  PERRLA.  EOMI.  He does not otherwise participate in neuro exam.  There is no step-offs or deformities over the C/T/L-spine  although somewhat difficult to see if he has tenderness states his entire back hurts on palpation of the spine.  No overlying skin changes or fluctuance.     ____________________________________________   LABS (all labs ordered are listed, but only abnormal results are displayed)  Labs Reviewed  URINALYSIS, COMPLETE (UACMP) WITH MICROSCOPIC - Abnormal; Notable for the following components:      Result Value   Color, Urine YELLOW (*)    APPearance HAZY (*)    Nitrite POSITIVE (*)  Leukocytes,Ua MODERATE (*)    Bacteria, UA FEW (*)    All other components within normal limits  CBC WITH DIFFERENTIAL/PLATELET - Abnormal; Notable for the following components:   WBC 12.6 (*)    RBC 3.33 (*)    Hemoglobin 10.9 (*)    HCT 31.6 (*)    Neutro Abs 10.1 (*)    Abs Immature Granulocytes 0.11 (*)    All other components within normal limits  COMPREHENSIVE METABOLIC PANEL - Abnormal; Notable for the following components:   Sodium 133 (*)    Chloride 95 (*)    Glucose, Bld 149 (*)    BUN 30 (*)    Creatinine, Ser 1.35 (*)    GFR, Estimated 53 (*)    All other components within normal limits  TROPONIN I (HIGH SENSITIVITY) - Abnormal; Notable for the following components:   Troponin I (High Sensitivity) 119 (*)    All other components within normal limits  URINE CULTURE  MAGNESIUM  TROPONIN I (HIGH SENSITIVITY)   ____________________________________________  EKG  Initially premature complexes with sinus rhythm at a rate of 95, normal axis and some nonspecific ST changes throughout.  Otherwise unremarkable intervals without clearance of acute ischemia. ____________________________________________  RADIOLOGY  ED MD interpretation: Plain films of the patient's chest, T and L-spine show no acute fracture or dislocation.  Some degenerative changes of L-spine.   Official radiology report(s): DG Thoracic Spine 2 View  Result Date: 02/04/2021 CLINICAL DATA:  Back pain. EXAM: THORACIC  SPINE 2 VIEWS COMPARISON:  CT a of the chest on 07/08/2020 FINDINGS: No evidence of fracture or subluxation. No bony lesions or destruction. The rest of the visualized chest is unremarkable. IMPRESSION: Negative. Electronically Signed   By: Aletta Edouard M.D.   On: 02/04/2021 14:39   DG Lumbar Spine Complete  Result Date: 02/04/2021 CLINICAL DATA:  Back pain. EXAM: LUMBAR SPINE - COMPLETE 4+ VIEW COMPARISON:  CTA of the abdomen and pelvis on 07/28/2020 FINDINGS: No acute fracture or subluxation identified. Stable mild leftward convex scoliosis of the lumbar spine and degenerative disc disease with significant disc space narrowing at L2-3 and L5-S1. No bony lesions or destruction. 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 Electronically Signed   By: Aletta Edouard M.D.   On: 02/04/2021 14:38   DG Chest Portable 1 View  Result Date: 02/04/2021 CLINICAL DATA:  Altered mental status with back pain in a 80 year old male. EXAM: PORTABLE CHEST 1 VIEW COMPARISON:  Thoracic and lumbar spine evaluation of the same date. Chest x-ray from July 09, 2020. FINDINGS: Trachea midline. Cardiomediastinal contours and hilar structures are normal. Lungs are clear. The no effusion. No consolidation. No pneumothorax. On limited assessment no acute skeletal process IMPRESSION: No acute cardiopulmonary disease. Electronically Signed   By: Zetta Bills M.D.   On: 02/04/2021 14:30    ____________________________________________   PROCEDURES  Procedure(s) performed (including Critical Care):  .1-3 Lead EKG Interpretation  Date/Time: 02/04/2021 3:34 PM Performed by: Lucrezia Starch, MD Authorized by: Lucrezia Starch, MD     Interpretation: normal     ECG rate assessment: normal     Rhythm: sinus rhythm     Ectopy: none     Conduction: normal     ____________________________________________   INITIAL IMPRESSION / ASSESSMENT AND PLAN / ED  COURSE      Patient presents with above-stated history exam with very limited history as noted above for assessment of some acute on  chronic back pain that seems to have been nontraumatic.  He is afebrile and hemodynamically stable on exam.  He is complaining of some pain all over his back on palpation but has no evidence of trauma or cellulitis or abscess.  Otherwise appears to have relatively nonfocal supine exam.  He is confused but per review of records this is baseline.  Proximately an hour after patient arrival I was able to reach his wife who was able to write some additional history.  It seems patient has had worsening back pain over last several months not particularly different or worse today but not responding to Tylenol as recommended by the PCP with a saw 3 weeks ago.  Patient is getting home health as well as PT and OT but he has had some difficulty working with him secondary to pain in his back.  He has not had any recent falls or injuries, cough, fever, vomiting or other sick symptoms per wife.  She feels that he does need to have his pain better controlled to work with his therapist and does not wish him to be necessarily admitted.  We talked about goals of care for him and if he were to pass she would refer to him to pass naturally and not shocking him or do CPR.  DNR updated the chart.  Differential includes possible musculoskeletal spasm, nontraumatic pathological fracture, atypical presentation for ACS, status and kidney stones.   Plain films of the patient's chest, T and L-spine show no acute fracture or dislocation.  Some degenerative changes of L-spine.   CBC with mild leukocytosis with WBC count of 12.6, hemoglobin of 10.9 compared to 8.56 months ago and normal platelets.  CMP shows no significant electrode or metabolic derangements.  Magnesium is unremarkable.  EKG notable for troponin not suggestive of ACS. UA is concerning for possible cystitis with positive nitrites, moderate  leukocyte esterase and some bacteria.  No blood to suggest kidney stone at this time.  Patient does not seem to have CVA tenderness and given absence of leukocytosis or fever Evalose patient for pyonephritis or sepsis.  Will treat with one-time dose of Rocephin and discharged with Keflex.  Suspect pain which is described by his wife is really chronic in nature is likely secondary to degenerative changes and possible lumbar disc impingement as this is documented going back as far as 2014.  Is currently on Tylenol and patient's wife currently feels that he is consulting stronger.  Discussed possible low-dose opioid no advised that this is high risk in elderly patients with baseline confusion as it can lead to worsening confusion.  However she is amenable to trying this as Tylenol has not been helping.  We will also do a lidocaine patch.  EKG with nonspecific findings and troponin elevated 119.  It seems when troponin was last checked a downtrend from 198-184.  It is possible this is secondary to some chronic troponinemia and history of CKD patient denies any chest pain.  However given significant elevation we will plan to obtain a delta in a stable GI bleed patient requires further work-up in ED and will be safe for discharge with close outpatient PCP follow-up  After discussion with wife at bedside will defer any opioids or NSAIDs given mild decreased kidney function today.  Also advised the risk of opioids and worsening confusion.  He is amenable to trying to treat his kidney function at this time and giving Tylenol with lidocaine patch steadily been using icy hot patches in the  past.  Care patient signed over to oncoming rider approximately 1530.  Plan to follow-up repeat troponin is nonelevated discharge back home.     ____________________________________________   FINAL CLINICAL IMPRESSION(S) / ED DIAGNOSES  Final diagnoses:  Chronic midline back pain, unspecified back location  Acute cystitis  without hematuria  Troponin I above reference range    Medications  lidocaine (LIDODERM) 5 % 1 patch (1 patch Transdermal Patch Applied 02/04/21 1346)  acetaminophen (TYLENOL) tablet 1,000 mg (1,000 mg Oral Given 02/04/21 1346)  morphine 2 MG/ML injection 2 mg (2 mg Intravenous Given 02/04/21 1500)  cefTRIAXone (ROCEPHIN) 1 g in sodium chloride 0.9 % 100 mL IVPB (0 g Intravenous Stopped 02/04/21 1514)     ED Discharge Orders     None        Note:  This document was prepared using Dragon voice recognition software and may include unintentional dictation errors.    Lucrezia Starch, MD 02/04/21 1534

## 2021-02-04 NOTE — ED Provider Notes (Signed)
5:48 PM Assumed care for off going team.   Blood pressure 139/87, pulse 77, temperature 98.5 F (36.9 C), temperature source Oral, resp. rate 18, SpO2 98 %.  See their HPI for full report but in brief pending repeat trop.   Repeat troponin was stable therefore we will discharge her off going team's plan       Vanessa Woodward, MD 02/04/21 1750

## 2021-02-04 NOTE — ED Triage Notes (Signed)
Back pain 10/10, dementia, feeding tube, per ems tacky 140's

## 2021-02-06 LAB — URINE CULTURE

## 2021-02-14 ENCOUNTER — Other Ambulatory Visit: Payer: Self-pay

## 2021-02-14 ENCOUNTER — Other Ambulatory Visit: Payer: Medicare Other | Admitting: Nurse Practitioner

## 2021-02-14 ENCOUNTER — Encounter: Payer: Self-pay | Admitting: Nurse Practitioner

## 2021-02-14 DIAGNOSIS — M545 Low back pain, unspecified: Secondary | ICD-10-CM

## 2021-02-14 DIAGNOSIS — E43 Unspecified severe protein-calorie malnutrition: Secondary | ICD-10-CM

## 2021-02-14 DIAGNOSIS — Z515 Encounter for palliative care: Secondary | ICD-10-CM

## 2021-02-14 NOTE — Progress Notes (Signed)
[facility] Consult Note Telephone: [phone]  Fax: [phone]    Date of encounter: 02/14/21 PATIENT NAME: Alex Christensen [address] [place] [address]   [phone] (home)  DOB: Mar 13, 1941 MRN: [id number] PRIMARY CARE PROVIDER:    Maryland Pink, MD,  [place] [address] [phone]  RESPONSIBLE PARTY:    Contact Information     Name Relation Home Work Mobile   frederky Heffington,gloria Spouse   [phone]   Rosann Auerbach Daughter   [phone]   Quamir, Willemsen Daughter   [phone]      I met face to face with patient and wife in home. Palliative Care was asked to follow this patient by consultation request of  Dr Kary Kos to address advance care planning and complex medical decision making. This is a follow up visit. ASSESSMENT AND PLAN / RECOMMENDATIONS:   Symptom Management/Plan: 1. ACP: Discussed Hospice services through Medicare benefit. Mrs. Chamberland discussed [facility] felt he met eligibility criteria. Mrs Kennan and I talked at length about Hospice services, what is provided. We talked about home health, PT/OT vs Hospice. Ms. Matus endorses she wanted to think about Hospice, if decides sooner then next [facility] f/u visit will notify.   2. Protein calorie malnutrition feeding comfort foods with intermit bolus through g-tube though focus is on feeding orally. Weights/measurement   3. Chronic Lumbago with recent ED visit with workup showing degeneration of L-spine. Mr. Nowling continues to take tramadol with minimal relief. Offerred to try another medication such as oxycodone. Mrs. Sternberg declined sharing Mr. Mansouri has an appointment with Dr Kary Kos on July 14. Offerred to call Dr Kary Kos office to see if appointment can be moved up. Mrs. Bartus declined endorses she would keep same appointment. We talked about OTC topical analgesic Amicare to try. Mrs. Mcandrew endorses she  will try that. We talked about positioning, fall precautions; how Mr. Meiring is transferred by lift.    4. Palliative care encounter; Palliative care encounter; Palliative medicine team will continue to support patient, patient's family, and medical team. Visit consisted of counseling and education dealing with the complex and emotionally intense issues of symptom management and palliative care in the setting of serious and potentially life-threatening illness   Follow up Palliative Care Visit: Palliative care will continue to follow for complex medical decision making, advance care planning, and clarification of goals. Return 4 weeks or prn.  I spent 60 minutes providing this consultation. More than 50% of the time in this consultation was spent in counseling and care coordination.  PPS: 300%  HOSPICE ELIGIBILITY/DIAGNOSIS: TBD  Chief Complaint: Follow up visit for Palliative consult for complex medical decision making  HISTORY OF PRESENT ILLNESS:  Alex Christensen is a 80 y.o. year old male  with CML, TIA left brain s/p TPA, Dementia, HTN, chronic kidney disease stage , protein calorie malnutrition, dysphagia, g-tube, anemia, acoustic neuroma, hyperlipidemia, anemia. Hospitalized 5/6 / 2021 to 5 / 12 / 2021 TIA left brain s/p TPA. I called Mrs. Johannes to confirm PC f/u visit and covid screening negative. Mrs. Vallee in agreement. We talked about purpose of PC visit, Mr. Bady was lying back in his recliner in his living room. We talked about recent events including visit for low back pain to ED. We talked about symptoms of pain. We talked about recent ED visit with workup showing degeneration of L-spine. Mr. Ingle continues to take tramadol with minimal relief. Offerred to try  another medication such as oxycodone. Mrs. Gloss declined sharing Mr. Strothers has an appointment with Dr Kary Kos on July 14. Offerred to call Dr Kary Kos office to see if appointment can be moved up. Mrs. Busta declined  endorses she would keep same appointment. We talked about OTC topical analgesic Amicare to try. Mrs. Whitmire endorses she will try that. We talked about positioning, fall precautions; how Mr. Klaiber is transferred by lift. We talked about appetite. Mrs. Philipson endorses she would not want to try to feed Mr. Legault by g-tube. He is eating foods though very declined appetite. We talked about supplements. We talked about nutrition. We talked about medical goals of care. We revisited Hospice services, what is provided and Mr. Chalfant case already reviewed by [facility] which felt Mr. Hoeger meets Hospice criteria. Mrs Banke endorses at present Mr Star is currently receiving PT, with OT on hold for now as with the pain it is difficult to work with Mr. Elahi. We talked about Hospice philosophy. We talked about role PC in POC. Mrs. Chill endorses she will continue to think about Hospice and will contact if decides prior to next visit. F/u PC visit scheduled. Therapeutic listening, emotional support provided. Questions answered to satisfaction.  History obtained from review of EMR, discussion with Mrs and Mr. Winner.  I reviewed available labs, medications, imaging, studies and related documents from the EMR.  Records reviewed and summarized above.   ROS Full 14 system review of systems performed and negative with exception of: as per HPI.   Physical Exam: Constitutional: NAD General: frail appearing, thin, chronically ill, debilitated male EYES: lids intact ENMT: oral mucous membranes moist CV: S1S2, RRR, no LE edema Pulmonary: decrease bases, no increased work of breathing, no cough, room air Abdomen: normo-active BS + 4 quadrants, soft and non tender MSK: non-ambulatory; lift to w/c Skin: warm and dry, no rashes or wounds on visible skin Neuro:  + generalized weakness,  +mild cognitive impairment Psych: non-anxious affect, A and O x 3  Questions and concerns were addressed. The  patient/family was encouraged to call with questions and/or concerns. My business card was provided. Provided general support and encouragement, no other unmet needs identified   Thank you for the opportunity to participate in the care of Mr. Ostrow.  The palliative care team will continue to follow. Please call our office at [phone] if we can be of additional assistance.   This chart was dictated using voice recognition software.  Despite best efforts to proofread,  errors can occur which can change the documentation meaning.   Arna Luis Z Zyia Kaneko, NP   COVID-19 PATIENT SCREENING TOOL Asked and negative response unless otherwise noted:   Have you had symptoms of covid, tested positive or been in contact with someone with symptoms/positive test in the past 5-10 days? NO

## 2021-03-08 ENCOUNTER — Other Ambulatory Visit: Payer: Self-pay

## 2021-03-08 ENCOUNTER — Encounter: Payer: Self-pay | Admitting: Nurse Practitioner

## 2021-03-08 ENCOUNTER — Other Ambulatory Visit: Payer: Medicare Other | Admitting: Nurse Practitioner

## 2021-03-08 DIAGNOSIS — M545 Low back pain, unspecified: Secondary | ICD-10-CM

## 2021-03-08 DIAGNOSIS — E43 Unspecified severe protein-calorie malnutrition: Secondary | ICD-10-CM

## 2021-03-08 DIAGNOSIS — Z515 Encounter for palliative care: Secondary | ICD-10-CM

## 2021-03-08 NOTE — Progress Notes (Signed)
  [facility] Consult Note Telephone: [phone]  Fax: [phone]    Date of encounter: 03/08/21 PATIENT NAME: Alex Christensen [address] [place] [address]   [phone] (home)  DOB: 07/24/1941 MRN: [id number] PRIMARY CARE PROVIDER:    Hedrick, James, MD,  [address] [facility] [place] [address] [phone]  RESPONSIBLE PARTY:    Contact Information     Name Relation Home Work Mobile   Alex Christensen,Alex Christensen   [phone]   Alex Christensen Daughter   [phone]   Alex Christensen Daughter   [phone]      I met face to face with patient and family in home. Palliative Care was asked to follow this patient by consultation request of  Hedrick, James, MD to address advance care planning and complex medical decision making. This is a follow up visit.  ASSESSMENT AND PLAN / RECOMMENDATIONS:  Symptom Management/Plan: 1. ACP: Discussed Hospice, [name] in agreement to Hospice services, will request referral from Dr Hedrick. We talked about overall clinical decline, progression to end part of [name] life.    2. Protein calorie malnutrition feeding comfort foods with no futher bolus through g-tube though focus is on feeding orally. Weights/measurement. Nutrition discussed   3. Chronic Lumbago; [name] declined endorses she would keep same appointment. We talked about OTC topical analgesic Amicare tried with no improvement. We talked about positioning, fall precautions; how [name] is transferred by lift. [name] endorses will continue current regimen, not interested in trying other pain medications  I spent 60 minutes providing this consultation. More than 50% of the time in this consultation was spent in counseling and care coordination. PPS: 30%  HOSPICE ELIGIBILITY/DIAGNOSIS: Yes per Hospice Physicians  Chief Complaint: Follow up Palliative consult for complex medical decision  making  HISTORY OF PRESENT ILLNESS:  Alex Christensen is a 80 y.o. year old male  with CML, TIA left brain s/p TPA, Dementia, HTN, chronic kidney disease stage , protein calorie malnutrition, dysphagia, g-tube, anemia, acoustic neuroma, hyperlipidemia, anemia. Hospitalized 5/6 / 2021 to 5 / 12 / 2021 TIA left brain s/p TPA. .   History obtained from review of EMR, discussion with [name].  I reviewed available labs, medications, imaging, studies and related documents from the EMR.  Records reviewed and summarized above.   ROS  Full 14 system review of systems performed and negative with exception of: as per HPI.   Physical Exam: Constitutional: NAD General: frail appearing, cachetic, with temporal wasting confused male EYES: lids intact, ENMT: oral mucous membranes moist CV: S1S2, RRR, Pulmonary: decrease breath sounds,  no increased work of breathing, no cough, room air Abdomen: soft and non tender MSK: bed bound, muscle wasting Skin: warm and dry Neuro:  +severe generalized weakness,  + cognitive impairment Psych: non-anxious affect, A and Oriented to self  Questions and concerns were addressed. The patient/wife was encouraged to call with questions and/or concerns.  Provided general support and encouragement, no other unmet needs identified   Thank you for the opportunity to participate in the care of [name].  The palliative care team will continue to follow. Please call our office at [phone] if we can be of additional assistance.   This chart was dictated using voice recognition software.  Despite best efforts to proofread,  errors can occur which can change the documentation meaning.    Z , NP   COVID-19 PATIENT SCREENING TOOL Asked and negative   response unless otherwise noted:   Have you had symptoms of covid, tested positive or been in contact with someone with symptoms/positive test in the past 5-10 days?  NO 

## 2021-04-13 DEATH — deceased

## 2021-10-21 IMAGING — DX DG CHEST 1V PORT
2 series · 2 of 2 positions shown · non-contrast
Comparison: Chest radiograph July 08, 20.

CLINICAL DATA: Weakness, borderline oxygen level.

EXAM:
PORTABLE CHEST 1 VIEW

[chest ap (1 of 2)]
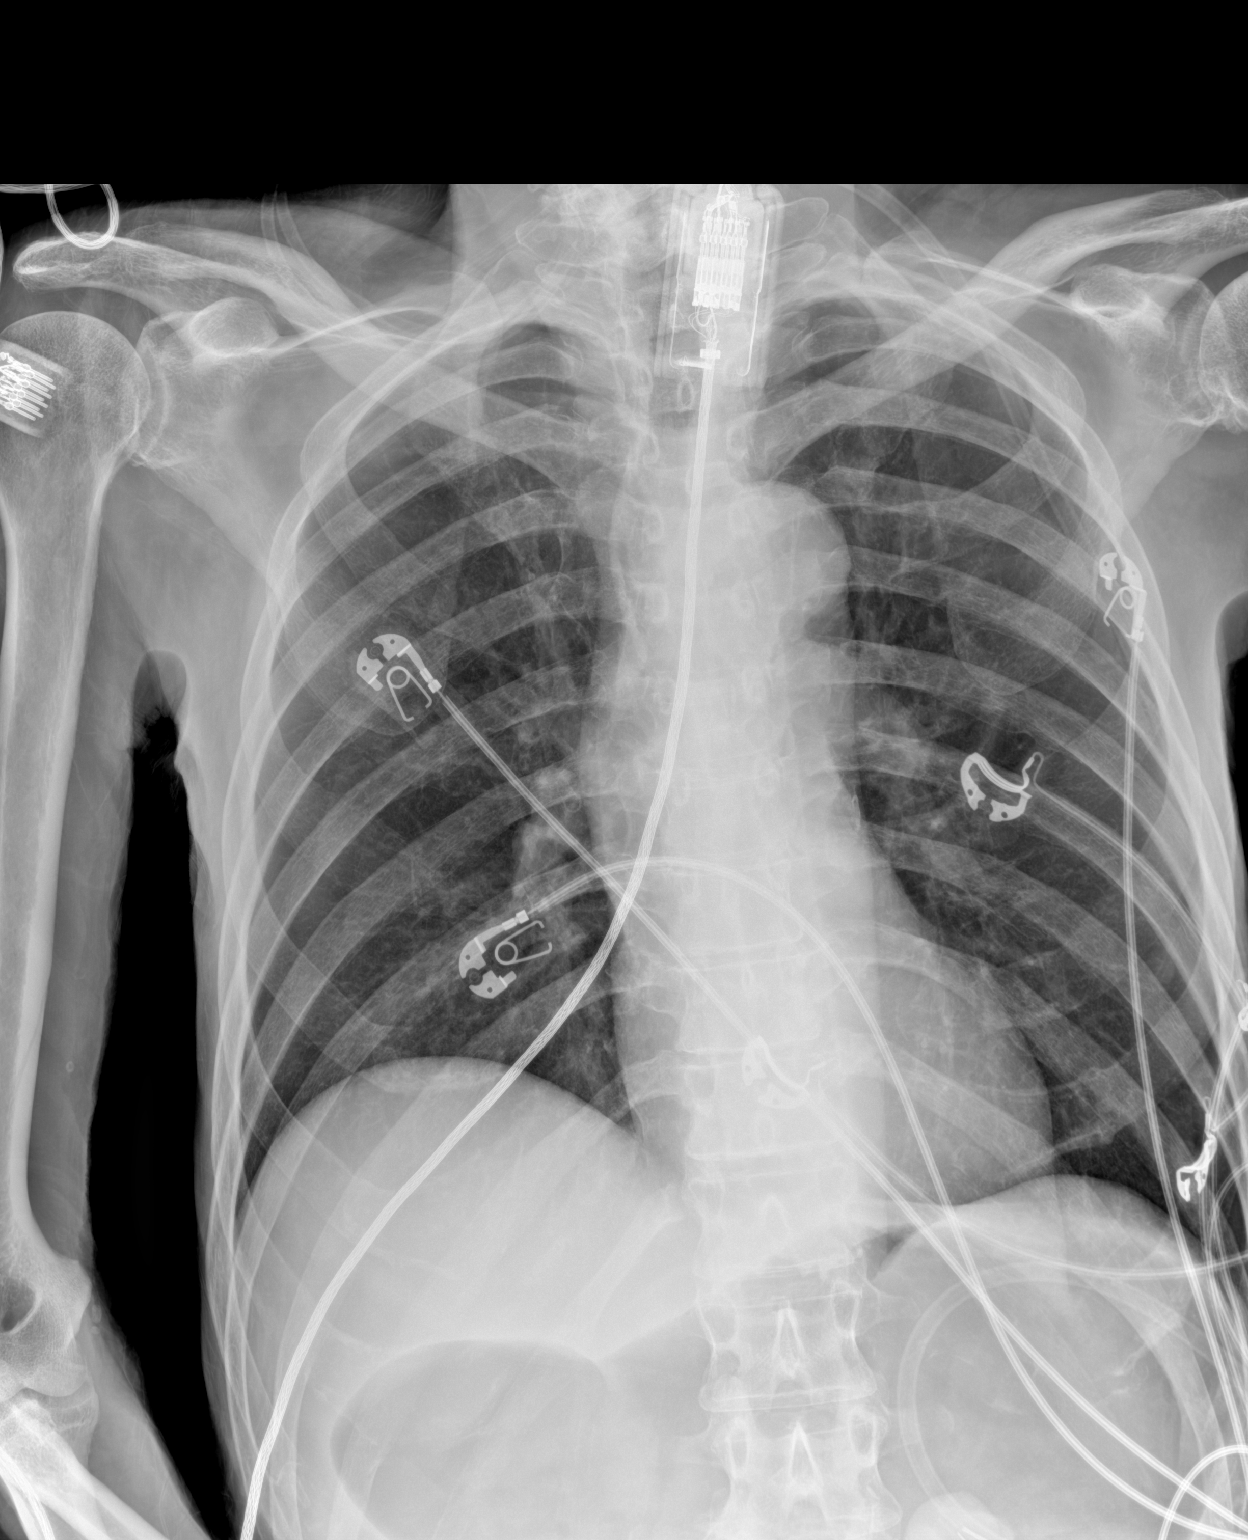

[chest ap (2 of 2)]
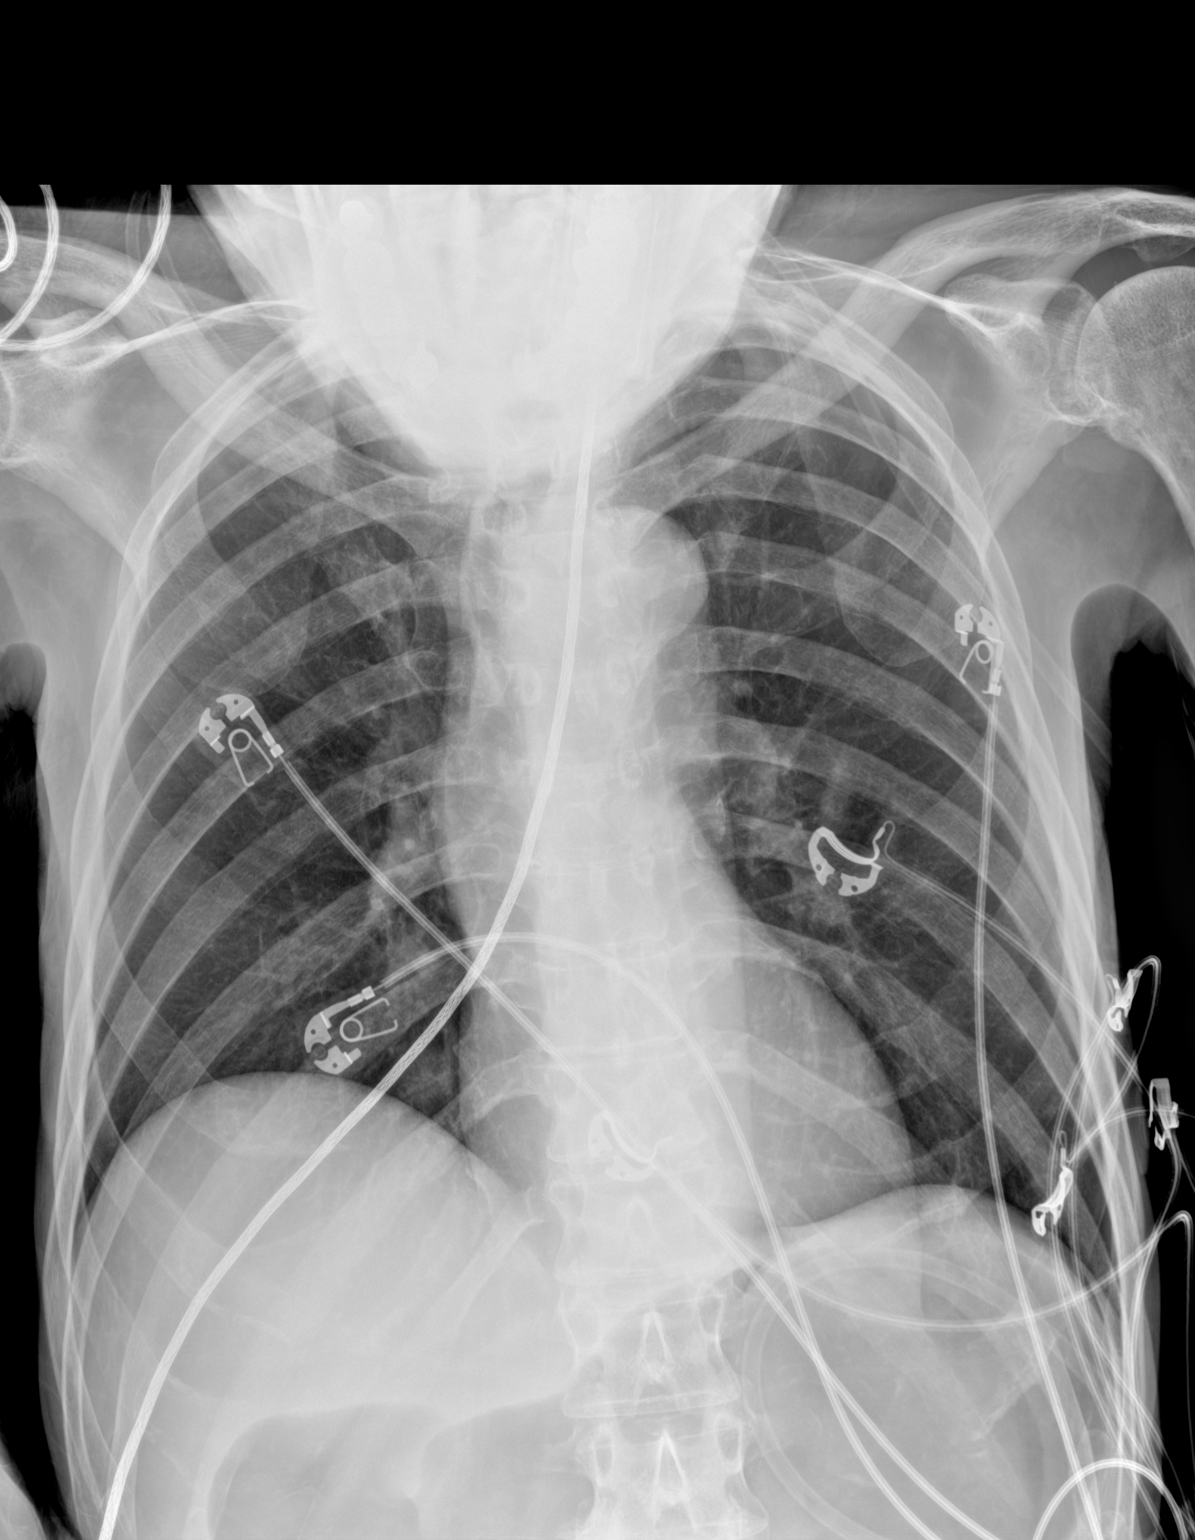

[2 of 2 positions shown; findings below may reference images not displayed]

FINDINGS: The heart size and mediastinal contours are within normal limits.
Both lungs are clear. No visible pleural effusions or pneumothorax.
No acute osseous abnormality. Marked gaseous distension of the
stomach, partially imaged.
IMPRESSION: 1. No acute cardiopulmonary disease.
2. Marked gaseous distension of the stomach, partially imaged.
